# Patient Record
Sex: Male | Born: 1940 | Race: White | Hispanic: No | Marital: Married | Smoking: Former smoker
Health system: Southern US, Community
[De-identification: ages and names within clinical notes are randomized; demographics above are authoritative.]

## PROBLEM LIST (undated history)

## (undated) DIAGNOSIS — E785 Hyperlipidemia, unspecified: Secondary | ICD-10-CM

## (undated) DIAGNOSIS — I10 Essential (primary) hypertension: Secondary | ICD-10-CM

## (undated) DIAGNOSIS — M1990 Unspecified osteoarthritis, unspecified site: Secondary | ICD-10-CM

## (undated) DIAGNOSIS — N189 Chronic kidney disease, unspecified: Secondary | ICD-10-CM

## (undated) DIAGNOSIS — L97529 Non-pressure chronic ulcer of other part of left foot with unspecified severity: Secondary | ICD-10-CM

## (undated) DIAGNOSIS — I509 Heart failure, unspecified: Secondary | ICD-10-CM

## (undated) DIAGNOSIS — G4730 Sleep apnea, unspecified: Secondary | ICD-10-CM

## (undated) DIAGNOSIS — F32A Depression, unspecified: Secondary | ICD-10-CM

## (undated) DIAGNOSIS — E119 Type 2 diabetes mellitus without complications: Secondary | ICD-10-CM

## (undated) DIAGNOSIS — Z89519 Acquired absence of unspecified leg below knee: Secondary | ICD-10-CM

## (undated) DIAGNOSIS — Z9289 Personal history of other medical treatment: Secondary | ICD-10-CM

## (undated) DIAGNOSIS — F329 Major depressive disorder, single episode, unspecified: Secondary | ICD-10-CM

## (undated) HISTORY — DX: Heart failure, unspecified: I50.9

## (undated) HISTORY — PX: CATARACT EXTRACTION W/ INTRAOCULAR LENS  IMPLANT, BILATERAL: SHX1307

## (undated) HISTORY — DX: Essential (primary) hypertension: I10

## (undated) HISTORY — DX: Acquired absence of unspecified leg below knee: Z89.519

## (undated) HISTORY — DX: Unspecified osteoarthritis, unspecified site: M19.90

## (undated) HISTORY — DX: Major depressive disorder, single episode, unspecified: F32.9

## (undated) HISTORY — PX: INGUINAL HERNIA REPAIR: SUR1180

## (undated) HISTORY — DX: Depression, unspecified: F32.A

## (undated) HISTORY — PX: LEG AMPUTATION BELOW KNEE: SHX694

## (undated) HISTORY — PX: ORCHIECTOMY: SHX2116

## (undated) HISTORY — DX: Chronic kidney disease, unspecified: N18.9

## (undated) HISTORY — DX: Hyperlipidemia, unspecified: E78.5

## (undated) HISTORY — DX: Sleep apnea, unspecified: G47.30

## (undated) HISTORY — PX: SHOULDER OPEN ROTATOR CUFF REPAIR: SHX2407

---

## 1997-08-25 ENCOUNTER — Other Ambulatory Visit: Admission: RE | Admit: 1997-08-25 | Discharge: 1997-08-25 | Payer: Self-pay | Admitting: Family Medicine

## 1999-09-28 ENCOUNTER — Ambulatory Visit (HOSPITAL_COMMUNITY): Admission: RE | Admit: 1999-09-28 | Discharge: 1999-09-28 | Payer: Self-pay | Admitting: Nephrology

## 1999-09-28 ENCOUNTER — Encounter: Payer: Self-pay | Admitting: Nephrology

## 1999-11-15 ENCOUNTER — Ambulatory Visit (HOSPITAL_BASED_OUTPATIENT_CLINIC_OR_DEPARTMENT_OTHER): Admission: RE | Admit: 1999-11-15 | Discharge: 1999-11-15 | Payer: Self-pay | Admitting: Orthopedic Surgery

## 2003-10-21 ENCOUNTER — Emergency Department (HOSPITAL_COMMUNITY): Admission: EM | Admit: 2003-10-21 | Discharge: 2003-10-21 | Payer: Self-pay | Admitting: Emergency Medicine

## 2005-06-19 ENCOUNTER — Inpatient Hospital Stay (HOSPITAL_COMMUNITY): Admission: EM | Admit: 2005-06-19 | Discharge: 2005-06-23 | Payer: Self-pay | Admitting: Emergency Medicine

## 2005-09-13 ENCOUNTER — Ambulatory Visit: Payer: Self-pay | Admitting: Family Medicine

## 2005-12-17 ENCOUNTER — Ambulatory Visit: Payer: Self-pay | Admitting: Family Medicine

## 2005-12-17 LAB — CONVERTED CEMR LAB
ALT: 14 units/L (ref 0–40)
AST: 19 units/L (ref 0–37)
CO2: 29 meq/L (ref 19–32)
Calcium: 9.9 mg/dL (ref 8.4–10.5)
Chol/HDL Ratio, serum: 4.9
Cholesterol: 170 mg/dL (ref 0–200)
Glomerular Filtration Rate, Af Am: 56 mL/min/{1.73_m2}
Glucose, Bld: 135 mg/dL — ABNORMAL HIGH (ref 70–99)
Microalb Creat Ratio: 116.9 mg/g — ABNORMAL HIGH (ref 0.0–30.0)
Potassium: 4.8 meq/L (ref 3.5–5.1)
Triglyceride fasting, serum: 147 mg/dL (ref 0–149)

## 2006-02-20 ENCOUNTER — Ambulatory Visit: Payer: Self-pay | Admitting: Family Medicine

## 2006-02-20 LAB — CONVERTED CEMR LAB
AST: 18 units/L (ref 0–37)
Chol/HDL Ratio, serum: 3.8
Cholesterol: 138 mg/dL (ref 0–200)

## 2006-03-31 ENCOUNTER — Ambulatory Visit: Payer: Self-pay | Admitting: Family Medicine

## 2006-04-16 ENCOUNTER — Ambulatory Visit: Payer: Self-pay | Admitting: Internal Medicine

## 2006-04-16 ENCOUNTER — Inpatient Hospital Stay (HOSPITAL_COMMUNITY): Admission: EM | Admit: 2006-04-16 | Discharge: 2006-04-19 | Payer: Self-pay | Admitting: Emergency Medicine

## 2006-04-16 ENCOUNTER — Ambulatory Visit: Payer: Self-pay | Admitting: Family Medicine

## 2006-05-29 ENCOUNTER — Ambulatory Visit: Payer: Self-pay | Admitting: Family Medicine

## 2006-06-11 DIAGNOSIS — E538 Deficiency of other specified B group vitamins: Secondary | ICD-10-CM | POA: Insufficient documentation

## 2006-06-11 DIAGNOSIS — E785 Hyperlipidemia, unspecified: Secondary | ICD-10-CM

## 2006-06-11 DIAGNOSIS — Z8669 Personal history of other diseases of the nervous system and sense organs: Secondary | ICD-10-CM | POA: Insufficient documentation

## 2006-06-11 DIAGNOSIS — IMO0002 Reserved for concepts with insufficient information to code with codable children: Secondary | ICD-10-CM | POA: Insufficient documentation

## 2006-06-11 DIAGNOSIS — E1129 Type 2 diabetes mellitus with other diabetic kidney complication: Secondary | ICD-10-CM

## 2006-06-11 DIAGNOSIS — N259 Disorder resulting from impaired renal tubular function, unspecified: Secondary | ICD-10-CM | POA: Insufficient documentation

## 2006-06-11 DIAGNOSIS — I10 Essential (primary) hypertension: Secondary | ICD-10-CM | POA: Insufficient documentation

## 2006-06-11 DIAGNOSIS — E1165 Type 2 diabetes mellitus with hyperglycemia: Secondary | ICD-10-CM

## 2006-06-11 DIAGNOSIS — M109 Gout, unspecified: Secondary | ICD-10-CM

## 2006-06-11 DIAGNOSIS — F329 Major depressive disorder, single episode, unspecified: Secondary | ICD-10-CM

## 2006-06-30 ENCOUNTER — Ambulatory Visit: Payer: Self-pay | Admitting: Family Medicine

## 2006-08-08 ENCOUNTER — Ambulatory Visit: Payer: Self-pay | Admitting: Family Medicine

## 2006-09-02 ENCOUNTER — Telehealth (INDEPENDENT_AMBULATORY_CARE_PROVIDER_SITE_OTHER): Payer: Self-pay | Admitting: *Deleted

## 2006-09-03 ENCOUNTER — Ambulatory Visit: Payer: Self-pay | Admitting: Family Medicine

## 2006-09-03 DIAGNOSIS — E039 Hypothyroidism, unspecified: Secondary | ICD-10-CM | POA: Insufficient documentation

## 2006-09-03 LAB — CONVERTED CEMR LAB
ALT: 17 units/L (ref 0–53)
BUN: 19 mg/dL (ref 6–23)
CO2: 31 meq/L (ref 19–32)
GFR calc Af Amer: 86 mL/min
Glucose, Bld: 139 mg/dL — ABNORMAL HIGH (ref 70–99)
HDL: 27.1 mg/dL — ABNORMAL LOW (ref >39.0)
Potassium: 4 meq/L (ref 3.5–5.1)
Sodium: 141 meq/L (ref 135–145)
Total CHOL/HDL Ratio: 4.2
Triglycerides: 181 mg/dL — ABNORMAL HIGH (ref 0–149)

## 2006-09-04 ENCOUNTER — Telehealth (INDEPENDENT_AMBULATORY_CARE_PROVIDER_SITE_OTHER): Payer: Self-pay | Admitting: *Deleted

## 2006-10-10 ENCOUNTER — Ambulatory Visit: Payer: Self-pay | Admitting: Family Medicine

## 2006-10-15 LAB — CONVERTED CEMR LAB: TSH: 10.44 microintl units/mL — ABNORMAL HIGH (ref 0.35–5.50)

## 2006-11-07 ENCOUNTER — Ambulatory Visit: Payer: Self-pay | Admitting: Family Medicine

## 2006-11-13 ENCOUNTER — Telehealth (INDEPENDENT_AMBULATORY_CARE_PROVIDER_SITE_OTHER): Payer: Self-pay | Admitting: *Deleted

## 2006-12-04 ENCOUNTER — Ambulatory Visit: Payer: Self-pay | Admitting: Family Medicine

## 2006-12-30 ENCOUNTER — Telehealth (INDEPENDENT_AMBULATORY_CARE_PROVIDER_SITE_OTHER): Payer: Self-pay | Admitting: *Deleted

## 2006-12-30 ENCOUNTER — Ambulatory Visit: Payer: Self-pay | Admitting: Family Medicine

## 2006-12-31 ENCOUNTER — Telehealth (INDEPENDENT_AMBULATORY_CARE_PROVIDER_SITE_OTHER): Payer: Self-pay | Admitting: *Deleted

## 2006-12-31 ENCOUNTER — Encounter (INDEPENDENT_AMBULATORY_CARE_PROVIDER_SITE_OTHER): Payer: Self-pay | Admitting: Family Medicine

## 2006-12-31 LAB — CONVERTED CEMR LAB
BUN: 17 mg/dL (ref 6–23)
CO2: 30 meq/L (ref 19–32)
Calcium: 9.1 mg/dL (ref 8.4–10.5)
Chloride: 105 meq/L (ref 96–112)
Creatinine, Ser: 1.4 mg/dL (ref 0.4–1.5)
Hgb A1c MFr Bld: 6.3 % — ABNORMAL HIGH (ref 4.6–6.0)
TSH: 5.46 microintl units/mL (ref 0.35–5.50)
Uric Acid, Serum: 8.3 mg/dL — ABNORMAL HIGH (ref 2.4–7.0)

## 2007-01-01 ENCOUNTER — Telehealth (INDEPENDENT_AMBULATORY_CARE_PROVIDER_SITE_OTHER): Payer: Self-pay | Admitting: *Deleted

## 2007-01-11 ENCOUNTER — Encounter: Payer: Self-pay | Admitting: Family Medicine

## 2007-04-03 ENCOUNTER — Ambulatory Visit: Payer: Self-pay | Admitting: Family Medicine

## 2007-04-07 ENCOUNTER — Encounter (INDEPENDENT_AMBULATORY_CARE_PROVIDER_SITE_OTHER): Payer: Self-pay | Admitting: *Deleted

## 2007-04-07 LAB — CONVERTED CEMR LAB
ALT: 15 units/L (ref 0–53)
CO2: 31 meq/L (ref 19–32)
Chloride: 102 meq/L (ref 96–112)
Cholesterol: 129 mg/dL (ref 0–200)
Creatinine, Ser: 1.3 mg/dL (ref 0.4–1.5)
HDL: 35.4 mg/dL — ABNORMAL LOW (ref >39.0)
Potassium: 4.7 meq/L (ref 3.5–5.1)
Sodium: 141 meq/L (ref 135–145)
Triglycerides: 150 mg/dL — ABNORMAL HIGH (ref 0–149)

## 2007-06-16 ENCOUNTER — Telehealth (INDEPENDENT_AMBULATORY_CARE_PROVIDER_SITE_OTHER): Payer: Self-pay | Admitting: *Deleted

## 2007-06-18 ENCOUNTER — Ambulatory Visit: Payer: Self-pay | Admitting: Family Medicine

## 2007-06-18 DIAGNOSIS — M1990 Unspecified osteoarthritis, unspecified site: Secondary | ICD-10-CM | POA: Insufficient documentation

## 2007-06-26 ENCOUNTER — Ambulatory Visit: Payer: Self-pay | Admitting: Family Medicine

## 2007-06-29 ENCOUNTER — Telehealth (INDEPENDENT_AMBULATORY_CARE_PROVIDER_SITE_OTHER): Payer: Self-pay | Admitting: *Deleted

## 2007-06-30 LAB — CONVERTED CEMR LAB
ALT: 17 units/L (ref 0–53)
AST: 19 units/L (ref 0–37)
Albumin: 2.8 g/dL — ABNORMAL LOW (ref 3.5–5.2)
BUN: 13 mg/dL (ref 6–23)
CO2: 30 meq/L (ref 19–32)
Calcium: 9.1 mg/dL (ref 8.4–10.5)
Chloride: 105 meq/L (ref 96–112)
Cholesterol: 100 mg/dL (ref 0–200)
Creatinine, Ser: 1.4 mg/dL (ref 0.4–1.5)
HDL: 18.8 mg/dL — ABNORMAL LOW (ref >39.0)
Hgb A1c MFr Bld: 6.9 % — ABNORMAL HIGH (ref 4.6–6.0)
LDL Cholesterol: 52 mg/dL (ref 0–99)
Total Bilirubin: 0.5 mg/dL (ref 0.3–1.2)
Total CHOL/HDL Ratio: 5.3
Triglycerides: 144 mg/dL (ref 0–149)
Uric Acid, Serum: 8.6 mg/dL — ABNORMAL HIGH (ref 4.0–7.8)

## 2007-07-01 ENCOUNTER — Encounter (INDEPENDENT_AMBULATORY_CARE_PROVIDER_SITE_OTHER): Payer: Self-pay | Admitting: *Deleted

## 2007-07-08 ENCOUNTER — Ambulatory Visit: Payer: Self-pay | Admitting: Internal Medicine

## 2007-07-08 ENCOUNTER — Ambulatory Visit: Payer: Self-pay | Admitting: Cardiology

## 2007-07-08 ENCOUNTER — Encounter (INDEPENDENT_AMBULATORY_CARE_PROVIDER_SITE_OTHER): Payer: Self-pay | Admitting: Orthopedic Surgery

## 2007-07-08 ENCOUNTER — Inpatient Hospital Stay (HOSPITAL_COMMUNITY): Admission: RE | Admit: 2007-07-08 | Discharge: 2007-08-03 | Payer: Self-pay | Admitting: Orthopedic Surgery

## 2007-07-09 ENCOUNTER — Ambulatory Visit: Payer: Self-pay | Admitting: Physical Medicine & Rehabilitation

## 2007-07-13 ENCOUNTER — Encounter (INDEPENDENT_AMBULATORY_CARE_PROVIDER_SITE_OTHER): Payer: Self-pay | Admitting: Orthopedic Surgery

## 2007-08-13 ENCOUNTER — Inpatient Hospital Stay (HOSPITAL_COMMUNITY): Admission: EM | Admit: 2007-08-13 | Discharge: 2007-09-01 | Payer: Self-pay | Admitting: Emergency Medicine

## 2007-09-22 ENCOUNTER — Inpatient Hospital Stay (HOSPITAL_COMMUNITY): Admission: EM | Admit: 2007-09-22 | Discharge: 2007-10-02 | Payer: Self-pay | Admitting: Emergency Medicine

## 2007-09-25 ENCOUNTER — Ambulatory Visit: Payer: Self-pay | Admitting: Infectious Diseases

## 2007-09-29 ENCOUNTER — Ambulatory Visit: Payer: Self-pay | Admitting: Dentistry

## 2007-10-01 ENCOUNTER — Ambulatory Visit: Payer: Self-pay | Admitting: Dentistry

## 2007-10-02 ENCOUNTER — Telehealth (INDEPENDENT_AMBULATORY_CARE_PROVIDER_SITE_OTHER): Payer: Self-pay | Admitting: *Deleted

## 2007-10-08 ENCOUNTER — Telehealth (INDEPENDENT_AMBULATORY_CARE_PROVIDER_SITE_OTHER): Payer: Self-pay | Admitting: *Deleted

## 2007-10-12 ENCOUNTER — Encounter: Admission: RE | Admit: 2007-10-12 | Discharge: 2007-10-12 | Payer: Self-pay | Admitting: Dentistry

## 2007-11-03 ENCOUNTER — Emergency Department (HOSPITAL_COMMUNITY): Admission: EM | Admit: 2007-11-03 | Discharge: 2007-11-04 | Payer: Self-pay | Admitting: Emergency Medicine

## 2007-11-23 ENCOUNTER — Telehealth: Payer: Self-pay | Admitting: Family Medicine

## 2007-12-02 ENCOUNTER — Ambulatory Visit: Payer: Self-pay | Admitting: Dentistry

## 2007-12-10 ENCOUNTER — Telehealth (INDEPENDENT_AMBULATORY_CARE_PROVIDER_SITE_OTHER): Payer: Self-pay | Admitting: *Deleted

## 2007-12-11 ENCOUNTER — Telehealth: Payer: Self-pay | Admitting: Family Medicine

## 2007-12-15 ENCOUNTER — Ambulatory Visit: Payer: Self-pay | Admitting: Internal Medicine

## 2007-12-15 ENCOUNTER — Inpatient Hospital Stay (HOSPITAL_COMMUNITY): Admission: EM | Admit: 2007-12-15 | Discharge: 2007-12-23 | Payer: Self-pay | Admitting: Emergency Medicine

## 2007-12-15 ENCOUNTER — Ambulatory Visit: Payer: Self-pay | Admitting: Critical Care Medicine

## 2007-12-19 ENCOUNTER — Ambulatory Visit: Payer: Self-pay | Admitting: Internal Medicine

## 2007-12-23 ENCOUNTER — Encounter: Payer: Self-pay | Admitting: Family Medicine

## 2007-12-25 ENCOUNTER — Telehealth (INDEPENDENT_AMBULATORY_CARE_PROVIDER_SITE_OTHER): Payer: Self-pay | Admitting: *Deleted

## 2007-12-28 ENCOUNTER — Telehealth (INDEPENDENT_AMBULATORY_CARE_PROVIDER_SITE_OTHER): Payer: Self-pay | Admitting: *Deleted

## 2007-12-29 ENCOUNTER — Encounter: Payer: Self-pay | Admitting: Family Medicine

## 2007-12-30 ENCOUNTER — Encounter: Payer: Self-pay | Admitting: Family Medicine

## 2007-12-31 ENCOUNTER — Encounter: Payer: Self-pay | Admitting: Family Medicine

## 2008-01-01 ENCOUNTER — Telehealth (INDEPENDENT_AMBULATORY_CARE_PROVIDER_SITE_OTHER): Payer: Self-pay | Admitting: *Deleted

## 2008-01-06 ENCOUNTER — Encounter: Payer: Self-pay | Admitting: Family Medicine

## 2008-01-07 ENCOUNTER — Telehealth: Payer: Self-pay | Admitting: Family Medicine

## 2008-01-18 ENCOUNTER — Telehealth (INDEPENDENT_AMBULATORY_CARE_PROVIDER_SITE_OTHER): Payer: Self-pay | Admitting: *Deleted

## 2008-01-20 ENCOUNTER — Telehealth: Payer: Self-pay | Admitting: Family Medicine

## 2008-02-01 ENCOUNTER — Encounter: Payer: Self-pay | Admitting: Family Medicine

## 2008-02-05 ENCOUNTER — Encounter: Payer: Self-pay | Admitting: Family Medicine

## 2008-02-10 ENCOUNTER — Ambulatory Visit: Payer: Self-pay | Admitting: Family Medicine

## 2008-02-10 LAB — CONVERTED CEMR LAB
ALT: 19 units/L (ref 0–53)
AST: 23 units/L (ref 0–37)
BUN: 13 mg/dL (ref 6–23)
Basophils Relative: 0.6 % (ref 0.0–3.0)
CO2: 30 meq/L (ref 19–32)
Calcium: 9.8 mg/dL (ref 8.4–10.5)
Chloride: 103 meq/L (ref 96–112)
Creatinine, Ser: 1.1 mg/dL (ref 0.4–1.5)
Eosinophils Absolute: 0.2 10*3/uL (ref 0.0–0.7)
Eosinophils Relative: 3.8 % (ref 0.0–5.0)
Folate: >20 ng/mL
Glucose, Bld: 148 mg/dL — ABNORMAL HIGH (ref 70–99)
HCT: 34.6 % — ABNORMAL LOW (ref 39.0–52.0)
HDL: 24.9 mg/dL — ABNORMAL LOW (ref >39.0)
Hemoglobin: 11.8 g/dL — ABNORMAL LOW (ref 13.0–17.0)
Hgb A1c MFr Bld: 5 % (ref 4.6–6.0)
MCV: 93.1 fL (ref 78.0–100.0)
Monocytes Absolute: 0.3 10*3/uL (ref 0.1–1.0)
Monocytes Relative: 5.8 % (ref 3.0–12.0)
Neutro Abs: 4 10*3/uL (ref 1.4–7.7)
RBC: 3.71 M/uL — ABNORMAL LOW (ref 4.22–5.81)
Total Bilirubin: 0.7 mg/dL (ref 0.3–1.2)
Total CHOL/HDL Ratio: 4.7
Total Protein: 7.7 g/dL (ref 6.0–8.3)
WBC: 5.9 10*3/uL (ref 4.5–10.5)

## 2008-02-11 ENCOUNTER — Telehealth (INDEPENDENT_AMBULATORY_CARE_PROVIDER_SITE_OTHER): Payer: Self-pay | Admitting: *Deleted

## 2008-02-12 ENCOUNTER — Telehealth: Payer: Self-pay | Admitting: Family Medicine

## 2008-02-16 ENCOUNTER — Encounter (INDEPENDENT_AMBULATORY_CARE_PROVIDER_SITE_OTHER): Payer: Self-pay | Admitting: *Deleted

## 2008-02-22 ENCOUNTER — Encounter: Payer: Self-pay | Admitting: Family Medicine

## 2008-02-25 ENCOUNTER — Telehealth (INDEPENDENT_AMBULATORY_CARE_PROVIDER_SITE_OTHER): Payer: Self-pay | Admitting: *Deleted

## 2008-02-29 ENCOUNTER — Encounter: Payer: Self-pay | Admitting: Family Medicine

## 2008-03-02 ENCOUNTER — Encounter: Payer: Self-pay | Admitting: Family Medicine

## 2008-03-15 ENCOUNTER — Telehealth: Payer: Self-pay | Admitting: Family Medicine

## 2008-03-21 ENCOUNTER — Telehealth (INDEPENDENT_AMBULATORY_CARE_PROVIDER_SITE_OTHER): Payer: Self-pay | Admitting: *Deleted

## 2008-03-28 ENCOUNTER — Telehealth: Payer: Self-pay | Admitting: Family Medicine

## 2008-04-05 ENCOUNTER — Telehealth (INDEPENDENT_AMBULATORY_CARE_PROVIDER_SITE_OTHER): Payer: Self-pay | Admitting: *Deleted

## 2008-05-03 ENCOUNTER — Encounter: Payer: Self-pay | Admitting: Family Medicine

## 2008-06-02 ENCOUNTER — Telehealth (INDEPENDENT_AMBULATORY_CARE_PROVIDER_SITE_OTHER): Payer: Self-pay | Admitting: *Deleted

## 2008-06-02 ENCOUNTER — Telehealth: Payer: Self-pay | Admitting: Family Medicine

## 2008-06-10 ENCOUNTER — Telehealth (INDEPENDENT_AMBULATORY_CARE_PROVIDER_SITE_OTHER): Payer: Self-pay | Admitting: *Deleted

## 2008-06-13 ENCOUNTER — Encounter: Payer: Self-pay | Admitting: Family Medicine

## 2008-06-25 ENCOUNTER — Encounter: Payer: Self-pay | Admitting: Family Medicine

## 2008-07-18 ENCOUNTER — Telehealth (INDEPENDENT_AMBULATORY_CARE_PROVIDER_SITE_OTHER): Payer: Self-pay | Admitting: *Deleted

## 2008-07-20 ENCOUNTER — Encounter: Admission: RE | Admit: 2008-07-20 | Discharge: 2008-10-18 | Payer: Self-pay | Admitting: Orthopedic Surgery

## 2008-08-18 ENCOUNTER — Encounter: Payer: Self-pay | Admitting: Family Medicine

## 2008-08-19 ENCOUNTER — Ambulatory Visit: Payer: Self-pay | Admitting: Family Medicine

## 2008-08-19 LAB — CONVERTED CEMR LAB
Bilirubin Urine: NEGATIVE
Ketones, urine, test strip: NEGATIVE
Nitrite: NEGATIVE
Protein, U semiquant: NEGATIVE
Urobilinogen, UA: NEGATIVE

## 2008-08-20 ENCOUNTER — Encounter: Payer: Self-pay | Admitting: Family Medicine

## 2008-08-21 LAB — CONVERTED CEMR LAB
ALT: 14 units/L (ref 0–53)
AST: 23 units/L (ref 0–37)
Basophils Absolute: 0 10*3/uL (ref 0.0–0.1)
CO2: 28 meq/L (ref 19–32)
Cholesterol: 156 mg/dL (ref 0–200)
Creatinine,U: 50.8 mg/dL
Eosinophils Absolute: 0.2 10*3/uL (ref 0.0–0.7)
Folate: 11 ng/mL
Glucose, Bld: 144 mg/dL — ABNORMAL HIGH (ref 70–99)
HCT: 34.1 % — ABNORMAL LOW (ref 39.0–52.0)
Hemoglobin: 11.5 g/dL — ABNORMAL LOW (ref 13.0–17.0)
Lymphs Abs: 1.2 10*3/uL (ref 0.7–4.0)
MCHC: 33.9 g/dL (ref 30.0–36.0)
MCV: 93.3 fL (ref 78.0–100.0)
Microalb, Ur: 10.1 mg/dL — ABNORMAL HIGH (ref 0.0–1.9)
Monocytes Absolute: 0.3 10*3/uL (ref 0.1–1.0)
Neutro Abs: 3.5 10*3/uL (ref 1.4–7.7)
Potassium: 4.4 meq/L (ref 3.5–5.1)
RDW: 15 % — ABNORMAL HIGH (ref 11.5–14.6)
Sodium: 140 meq/L (ref 135–145)
Total Protein: 7.6 g/dL (ref 6.0–8.3)
Uric Acid, Serum: 6.7 mg/dL (ref 4.0–7.8)
Vitamin B-12: >1500 pg/mL — ABNORMAL HIGH (ref 211–911)

## 2008-08-22 ENCOUNTER — Telehealth (INDEPENDENT_AMBULATORY_CARE_PROVIDER_SITE_OTHER): Payer: Self-pay | Admitting: *Deleted

## 2008-08-24 ENCOUNTER — Telehealth (INDEPENDENT_AMBULATORY_CARE_PROVIDER_SITE_OTHER): Payer: Self-pay | Admitting: *Deleted

## 2008-08-25 ENCOUNTER — Telehealth: Payer: Self-pay | Admitting: Family Medicine

## 2008-09-14 ENCOUNTER — Telehealth (INDEPENDENT_AMBULATORY_CARE_PROVIDER_SITE_OTHER): Payer: Self-pay | Admitting: *Deleted

## 2008-09-26 ENCOUNTER — Telehealth (INDEPENDENT_AMBULATORY_CARE_PROVIDER_SITE_OTHER): Payer: Self-pay | Admitting: *Deleted

## 2008-10-20 ENCOUNTER — Encounter: Admission: RE | Admit: 2008-10-20 | Discharge: 2008-12-13 | Payer: Self-pay | Admitting: Orthopedic Surgery

## 2008-10-24 ENCOUNTER — Telehealth: Payer: Self-pay | Admitting: Family Medicine

## 2008-11-08 ENCOUNTER — Encounter (INDEPENDENT_AMBULATORY_CARE_PROVIDER_SITE_OTHER): Payer: Self-pay | Admitting: *Deleted

## 2008-11-14 ENCOUNTER — Encounter: Payer: Self-pay | Admitting: Family Medicine

## 2008-11-16 ENCOUNTER — Ambulatory Visit: Payer: Self-pay | Admitting: Family Medicine

## 2008-11-17 ENCOUNTER — Encounter: Payer: Self-pay | Admitting: Family Medicine

## 2008-11-22 ENCOUNTER — Encounter: Payer: Self-pay | Admitting: Family Medicine

## 2008-12-01 ENCOUNTER — Inpatient Hospital Stay (HOSPITAL_COMMUNITY): Admission: EM | Admit: 2008-12-01 | Discharge: 2008-12-12 | Payer: Self-pay | Admitting: Emergency Medicine

## 2008-12-01 ENCOUNTER — Encounter: Payer: Self-pay | Admitting: Family Medicine

## 2008-12-10 ENCOUNTER — Encounter (INDEPENDENT_AMBULATORY_CARE_PROVIDER_SITE_OTHER): Payer: Self-pay | Admitting: Cardiology

## 2008-12-11 ENCOUNTER — Encounter: Payer: Self-pay | Admitting: Family Medicine

## 2008-12-13 ENCOUNTER — Telehealth: Payer: Self-pay | Admitting: Family Medicine

## 2008-12-21 ENCOUNTER — Telehealth: Payer: Self-pay | Admitting: Family Medicine

## 2008-12-22 ENCOUNTER — Encounter: Payer: Self-pay | Admitting: Family Medicine

## 2008-12-23 ENCOUNTER — Encounter: Payer: Self-pay | Admitting: Family Medicine

## 2008-12-27 ENCOUNTER — Encounter: Payer: Self-pay | Admitting: Family Medicine

## 2008-12-29 ENCOUNTER — Telehealth: Payer: Self-pay | Admitting: Family Medicine

## 2009-01-03 ENCOUNTER — Encounter: Payer: Self-pay | Admitting: Family Medicine

## 2009-01-05 ENCOUNTER — Ambulatory Visit: Payer: Self-pay | Admitting: Family Medicine

## 2009-01-05 DIAGNOSIS — S88119A Complete traumatic amputation at level between knee and ankle, unspecified lower leg, initial encounter: Secondary | ICD-10-CM | POA: Insufficient documentation

## 2009-01-06 ENCOUNTER — Encounter: Payer: Self-pay | Admitting: Family Medicine

## 2009-01-12 ENCOUNTER — Telehealth (INDEPENDENT_AMBULATORY_CARE_PROVIDER_SITE_OTHER): Payer: Self-pay | Admitting: *Deleted

## 2009-01-13 IMAGING — CR DG CHEST 2V
3 series · 3 of 3 positions shown · non-contrast
Comparison: Portable chest x-ray 07/20/2007 and 07/14/2007.

CLINICAL DATA: Fever.  Decubitus ulcer.

CHEST - 2 VIEW 09/22/2007:

[w chest lat]
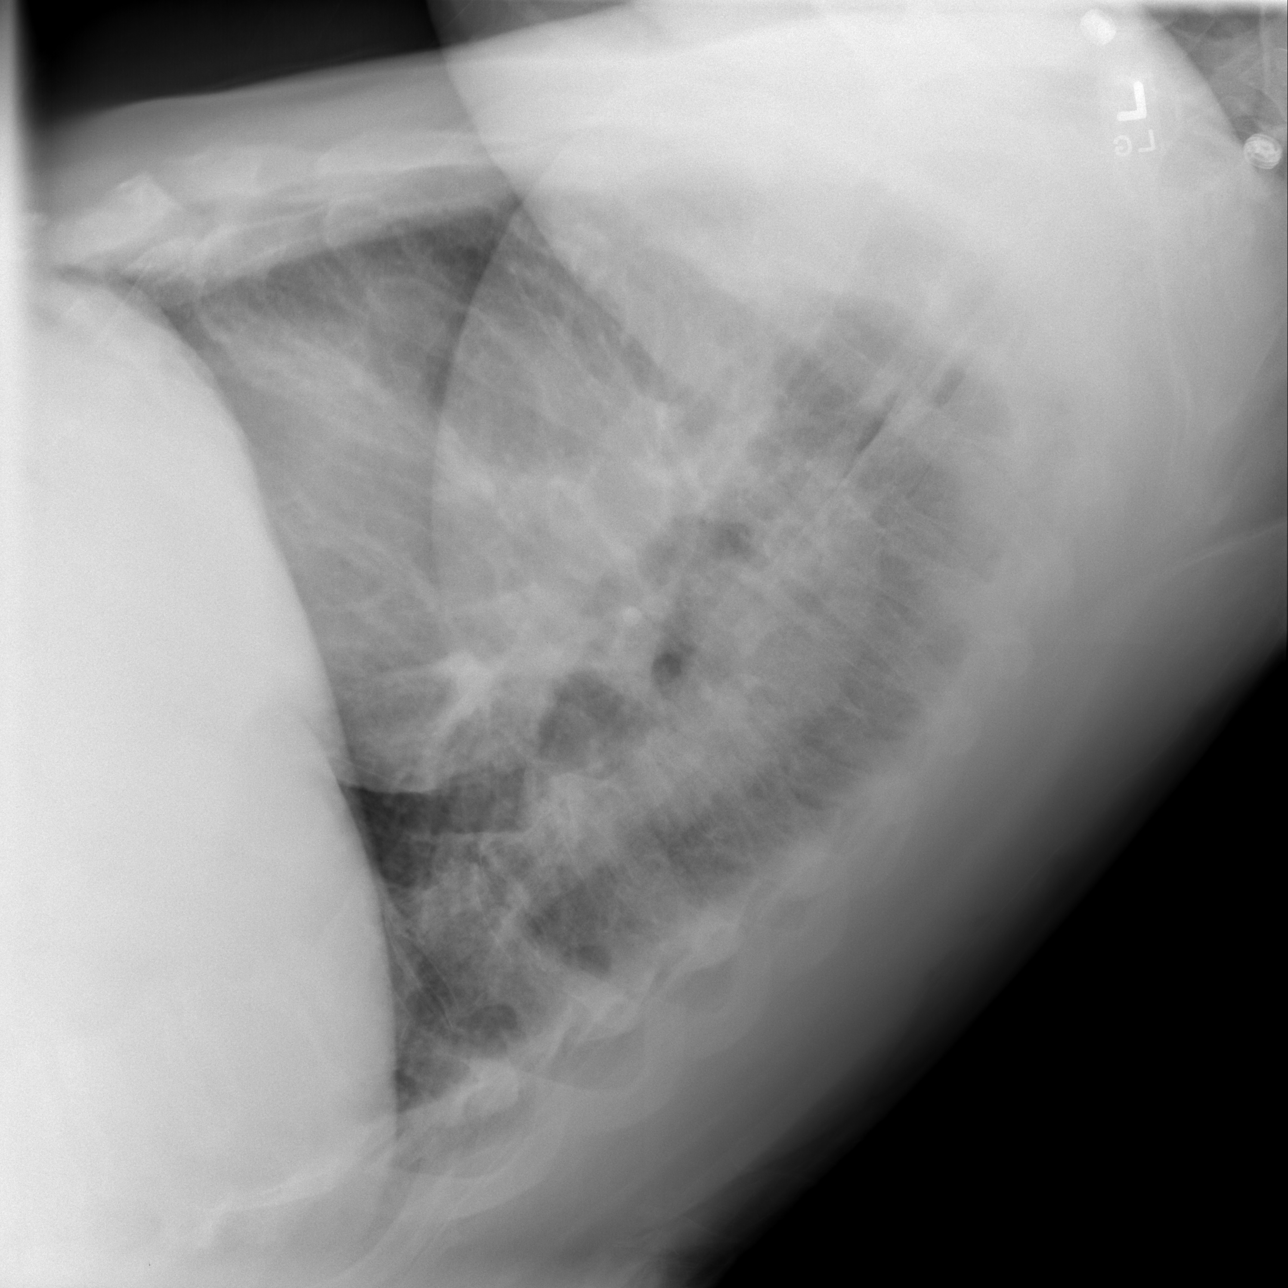

[view not recorded (1 of 2)]
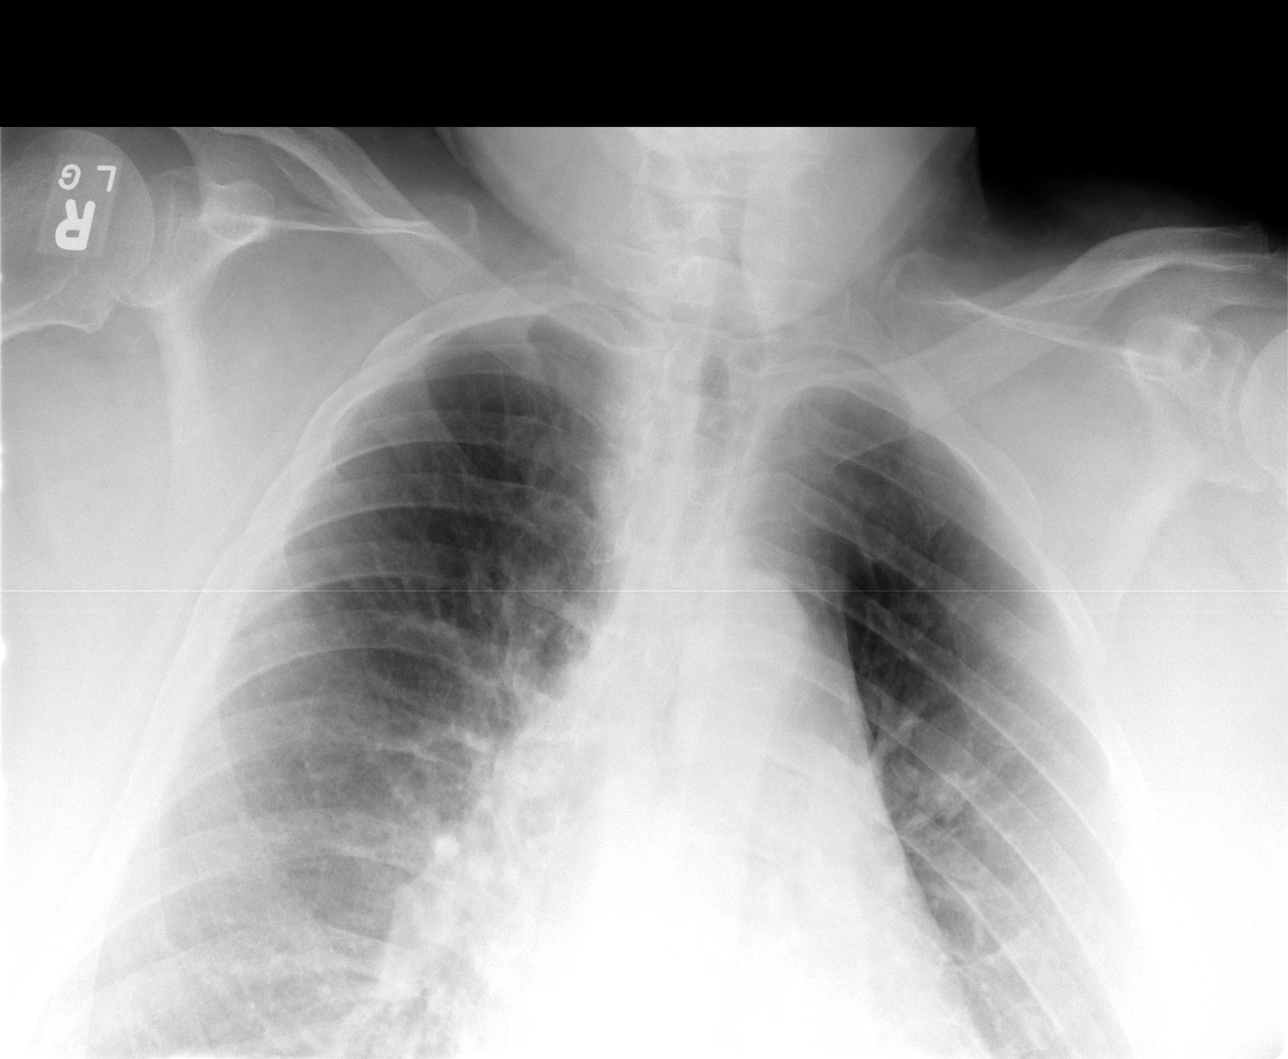

[view not recorded (2 of 2)]
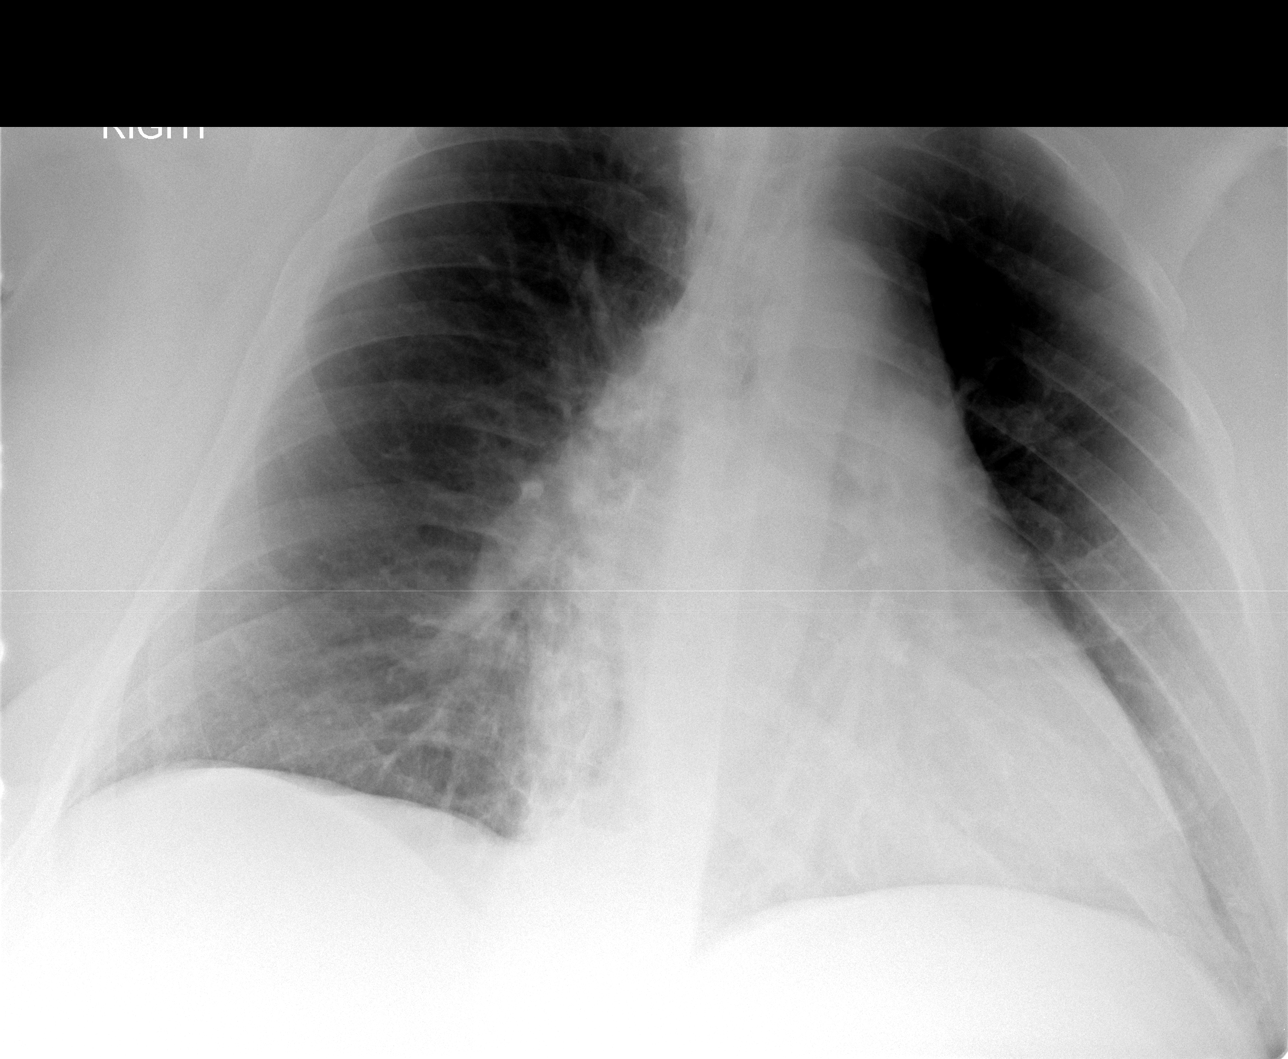

[3 of 3 positions shown; findings below may reference images not displayed]

FINDINGS: Heart enlarged but stable.  Pulmonary venous hypertension
without overt edema.  Emphysematous changes suspected in the upper
lobes.  Lungs clear.  No pleural effusions.  Degenerative changes
again noted throughout the thoracic spine.
IMPRESSION: Stable cardiomegaly and COPD/emphysema.  No acute cardiopulmonary
disease.

## 2009-01-18 ENCOUNTER — Telehealth: Payer: Self-pay | Admitting: Family Medicine

## 2009-01-18 ENCOUNTER — Encounter: Payer: Self-pay | Admitting: Family Medicine

## 2009-01-19 ENCOUNTER — Encounter: Payer: Self-pay | Admitting: Family Medicine

## 2009-01-19 ENCOUNTER — Telehealth: Payer: Self-pay | Admitting: Family Medicine

## 2009-01-25 ENCOUNTER — Encounter: Payer: Self-pay | Admitting: Family Medicine

## 2009-01-30 ENCOUNTER — Telehealth (INDEPENDENT_AMBULATORY_CARE_PROVIDER_SITE_OTHER): Payer: Self-pay | Admitting: *Deleted

## 2009-01-31 ENCOUNTER — Encounter: Payer: Self-pay | Admitting: Family Medicine

## 2009-02-03 ENCOUNTER — Ambulatory Visit: Payer: Self-pay | Admitting: Family Medicine

## 2009-02-06 DIAGNOSIS — G4730 Sleep apnea, unspecified: Secondary | ICD-10-CM | POA: Insufficient documentation

## 2009-02-07 ENCOUNTER — Encounter (INDEPENDENT_AMBULATORY_CARE_PROVIDER_SITE_OTHER): Payer: Self-pay | Admitting: *Deleted

## 2009-02-09 ENCOUNTER — Encounter: Payer: Self-pay | Admitting: Family Medicine

## 2009-02-13 ENCOUNTER — Telehealth (INDEPENDENT_AMBULATORY_CARE_PROVIDER_SITE_OTHER): Payer: Self-pay | Admitting: *Deleted

## 2009-02-14 ENCOUNTER — Encounter: Payer: Self-pay | Admitting: Family Medicine

## 2009-02-17 ENCOUNTER — Encounter: Payer: Self-pay | Admitting: Family Medicine

## 2009-03-06 ENCOUNTER — Telehealth: Payer: Self-pay | Admitting: Family Medicine

## 2009-03-06 ENCOUNTER — Ambulatory Visit: Payer: Self-pay | Admitting: Family Medicine

## 2009-03-09 ENCOUNTER — Telehealth: Payer: Self-pay | Admitting: Family Medicine

## 2009-03-13 ENCOUNTER — Encounter: Payer: Self-pay | Admitting: Family Medicine

## 2009-03-15 ENCOUNTER — Telehealth: Payer: Self-pay | Admitting: Family Medicine

## 2009-03-16 ENCOUNTER — Encounter: Payer: Self-pay | Admitting: Family Medicine

## 2009-03-29 ENCOUNTER — Telehealth (INDEPENDENT_AMBULATORY_CARE_PROVIDER_SITE_OTHER): Payer: Self-pay | Admitting: *Deleted

## 2009-04-03 ENCOUNTER — Encounter: Payer: Self-pay | Admitting: Family Medicine

## 2009-04-14 ENCOUNTER — Telehealth: Payer: Self-pay | Admitting: Family Medicine

## 2009-04-17 ENCOUNTER — Encounter: Payer: Self-pay | Admitting: Family Medicine

## 2009-04-18 ENCOUNTER — Encounter: Payer: Self-pay | Admitting: Family Medicine

## 2009-04-19 ENCOUNTER — Encounter: Payer: Self-pay | Admitting: Family Medicine

## 2009-04-25 ENCOUNTER — Encounter: Payer: Self-pay | Admitting: Family Medicine

## 2009-04-27 ENCOUNTER — Encounter: Payer: Self-pay | Admitting: Family Medicine

## 2009-04-28 ENCOUNTER — Encounter: Payer: Self-pay | Admitting: Family Medicine

## 2009-05-01 ENCOUNTER — Ambulatory Visit: Payer: Self-pay | Admitting: Family Medicine

## 2009-05-01 ENCOUNTER — Telehealth: Payer: Self-pay | Admitting: Family Medicine

## 2009-05-03 ENCOUNTER — Telehealth (INDEPENDENT_AMBULATORY_CARE_PROVIDER_SITE_OTHER): Payer: Self-pay | Admitting: *Deleted

## 2009-05-04 ENCOUNTER — Encounter: Payer: Self-pay | Admitting: Family Medicine

## 2009-05-04 ENCOUNTER — Telehealth (INDEPENDENT_AMBULATORY_CARE_PROVIDER_SITE_OTHER): Payer: Self-pay | Admitting: *Deleted

## 2009-05-15 ENCOUNTER — Telehealth: Payer: Self-pay | Admitting: Family Medicine

## 2009-05-17 ENCOUNTER — Telehealth: Payer: Self-pay | Admitting: Family Medicine

## 2009-05-22 ENCOUNTER — Encounter: Payer: Self-pay | Admitting: Family Medicine

## 2009-05-29 ENCOUNTER — Telehealth: Payer: Self-pay | Admitting: Family Medicine

## 2009-05-31 ENCOUNTER — Encounter: Payer: Self-pay | Admitting: Family Medicine

## 2009-05-31 ENCOUNTER — Telehealth (INDEPENDENT_AMBULATORY_CARE_PROVIDER_SITE_OTHER): Payer: Self-pay | Admitting: *Deleted

## 2009-06-21 ENCOUNTER — Telehealth: Payer: Self-pay | Admitting: Family Medicine

## 2009-06-26 ENCOUNTER — Telehealth (INDEPENDENT_AMBULATORY_CARE_PROVIDER_SITE_OTHER): Payer: Self-pay | Admitting: *Deleted

## 2009-06-30 ENCOUNTER — Telehealth: Payer: Self-pay | Admitting: Family Medicine

## 2009-06-30 ENCOUNTER — Encounter: Payer: Self-pay | Admitting: Family Medicine

## 2009-07-06 ENCOUNTER — Encounter: Payer: Self-pay | Admitting: Family Medicine

## 2009-07-11 ENCOUNTER — Telehealth (INDEPENDENT_AMBULATORY_CARE_PROVIDER_SITE_OTHER): Payer: Self-pay | Admitting: *Deleted

## 2009-07-18 ENCOUNTER — Telehealth (INDEPENDENT_AMBULATORY_CARE_PROVIDER_SITE_OTHER): Payer: Self-pay | Admitting: *Deleted

## 2009-07-25 ENCOUNTER — Telehealth: Payer: Self-pay | Admitting: Family Medicine

## 2009-08-01 ENCOUNTER — Encounter: Payer: Self-pay | Admitting: Family Medicine

## 2009-08-02 ENCOUNTER — Encounter: Payer: Self-pay | Admitting: Family Medicine

## 2009-08-08 ENCOUNTER — Ambulatory Visit: Payer: Self-pay | Admitting: Family Medicine

## 2009-08-08 DIAGNOSIS — H10029 Other mucopurulent conjunctivitis, unspecified eye: Secondary | ICD-10-CM

## 2009-08-08 LAB — CONVERTED CEMR LAB
ALT: 9 units/L (ref 0–53)
AST: 12 units/L (ref 0–37)
Albumin: 3.7 g/dL (ref 3.5–5.2)
BUN: 34 mg/dL — ABNORMAL HIGH (ref 6–23)
Basophils Relative: 0.4 % (ref 0.0–3.0)
Chloride: 107 meq/L (ref 96–112)
Cholesterol: 125 mg/dL (ref 0–200)
Creatinine,U: 52.2 mg/dL
Eosinophils Relative: 2.2 % (ref 0.0–5.0)
Folate: 6 ng/mL
GFR calc non Af Amer: 42.52 mL/min (ref >60)
Glucose, Bld: 59 mg/dL — ABNORMAL LOW (ref 70–99)
HCT: 32.5 % — ABNORMAL LOW (ref 39.0–52.0)
Hemoglobin: 10.9 g/dL — ABNORMAL LOW (ref 13.0–17.0)
Iron: 62 ug/dL (ref 42–165)
LDL Cholesterol: 70 mg/dL (ref 0–99)
Lymphs Abs: 1.1 10*3/uL (ref 0.7–4.0)
MCV: 93.5 fL (ref 78.0–100.0)
Monocytes Absolute: 0.4 10*3/uL (ref 0.1–1.0)
Monocytes Relative: 7 % (ref 3.0–12.0)
Neutro Abs: 4.2 10*3/uL (ref 1.4–7.7)
Potassium: 4.9 meq/L (ref 3.5–5.1)
Saturation Ratios: 16.9 % — ABNORMAL LOW (ref 20.0–50.0)
Sodium: 144 meq/L (ref 135–145)
TSH: 2.66 microintl units/mL (ref 0.35–5.50)
Total Protein: 7 g/dL (ref 6.0–8.3)
Uric Acid, Serum: 5.8 mg/dL (ref 4.0–7.8)
VLDL: 13.6 mg/dL (ref 0.0–40.0)
Vitamin B-12: 318 pg/mL (ref 211–911)
WBC: 5.9 10*3/uL (ref 4.5–10.5)

## 2009-08-10 ENCOUNTER — Telehealth: Payer: Self-pay | Admitting: Family Medicine

## 2009-08-11 ENCOUNTER — Telehealth: Payer: Self-pay | Admitting: Family Medicine

## 2009-08-14 ENCOUNTER — Encounter: Payer: Self-pay | Admitting: Family Medicine

## 2009-08-14 ENCOUNTER — Telehealth: Payer: Self-pay | Admitting: Family Medicine

## 2009-08-14 ENCOUNTER — Ambulatory Visit (HOSPITAL_COMMUNITY): Admission: RE | Admit: 2009-08-14 | Discharge: 2009-08-14 | Payer: Self-pay | Admitting: Family Medicine

## 2009-08-22 ENCOUNTER — Telehealth (INDEPENDENT_AMBULATORY_CARE_PROVIDER_SITE_OTHER): Payer: Self-pay | Admitting: *Deleted

## 2009-08-24 ENCOUNTER — Telehealth: Payer: Self-pay | Admitting: Family Medicine

## 2009-08-31 ENCOUNTER — Telehealth: Payer: Self-pay | Admitting: Family Medicine

## 2009-09-06 ENCOUNTER — Encounter: Payer: Self-pay | Admitting: Family Medicine

## 2009-09-07 ENCOUNTER — Encounter: Payer: Self-pay | Admitting: Family Medicine

## 2009-09-15 ENCOUNTER — Telehealth: Payer: Self-pay | Admitting: Family Medicine

## 2009-09-19 ENCOUNTER — Encounter: Payer: Self-pay | Admitting: Family Medicine

## 2009-09-21 ENCOUNTER — Telehealth: Payer: Self-pay | Admitting: Family Medicine

## 2009-09-25 ENCOUNTER — Encounter: Payer: Self-pay | Admitting: Family Medicine

## 2009-09-28 ENCOUNTER — Encounter: Payer: Self-pay | Admitting: Family Medicine

## 2009-10-04 ENCOUNTER — Encounter: Payer: Self-pay | Admitting: Family Medicine

## 2009-10-04 ENCOUNTER — Telehealth (INDEPENDENT_AMBULATORY_CARE_PROVIDER_SITE_OTHER): Payer: Self-pay | Admitting: *Deleted

## 2009-10-05 ENCOUNTER — Telehealth (INDEPENDENT_AMBULATORY_CARE_PROVIDER_SITE_OTHER): Payer: Self-pay | Admitting: *Deleted

## 2009-10-05 DIAGNOSIS — D510 Vitamin B12 deficiency anemia due to intrinsic factor deficiency: Secondary | ICD-10-CM

## 2009-10-06 ENCOUNTER — Encounter: Payer: Self-pay | Admitting: Family Medicine

## 2009-10-16 ENCOUNTER — Ambulatory Visit: Payer: Self-pay | Admitting: Family Medicine

## 2009-10-18 ENCOUNTER — Telehealth: Payer: Self-pay | Admitting: Family Medicine

## 2009-10-18 ENCOUNTER — Telehealth (INDEPENDENT_AMBULATORY_CARE_PROVIDER_SITE_OTHER): Payer: Self-pay | Admitting: *Deleted

## 2009-10-18 LAB — CONVERTED CEMR LAB
Basophils Relative: 0.3 % (ref 0.0–3.0)
Eosinophils Absolute: 0.1 10*3/uL (ref 0.0–0.7)
HCT: 33.1 % — ABNORMAL LOW (ref 39.0–52.0)
Hemoglobin: 11 g/dL — ABNORMAL LOW (ref 13.0–17.0)
Lymphocytes Relative: 16.7 % (ref 12.0–46.0)
Lymphs Abs: 1.1 10*3/uL (ref 0.7–4.0)
MCHC: 33.2 g/dL (ref 30.0–36.0)
MCV: 94.3 fL (ref 78.0–100.0)
Neutro Abs: 5 10*3/uL (ref 1.4–7.7)
RBC: 3.51 M/uL — ABNORMAL LOW (ref 4.22–5.81)
RDW: 16.3 % — ABNORMAL HIGH (ref 11.5–14.6)

## 2009-10-30 ENCOUNTER — Encounter: Payer: Self-pay | Admitting: Family Medicine

## 2009-10-31 ENCOUNTER — Telehealth: Payer: Self-pay | Admitting: Family Medicine

## 2009-11-02 ENCOUNTER — Telehealth (INDEPENDENT_AMBULATORY_CARE_PROVIDER_SITE_OTHER): Payer: Self-pay | Admitting: *Deleted

## 2009-11-02 DIAGNOSIS — L89309 Pressure ulcer of unspecified buttock, unspecified stage: Secondary | ICD-10-CM | POA: Insufficient documentation

## 2009-11-09 ENCOUNTER — Encounter: Payer: Self-pay | Admitting: Family Medicine

## 2009-11-16 ENCOUNTER — Encounter: Payer: Self-pay | Admitting: Family Medicine

## 2009-11-17 ENCOUNTER — Ambulatory Visit (HOSPITAL_COMMUNITY): Admission: RE | Admit: 2009-11-17 | Discharge: 2009-11-17 | Payer: Self-pay | Admitting: Urology

## 2009-11-23 ENCOUNTER — Encounter: Payer: Self-pay | Admitting: Family Medicine

## 2009-11-24 ENCOUNTER — Encounter: Payer: Self-pay | Admitting: Family Medicine

## 2009-11-30 ENCOUNTER — Telehealth (INDEPENDENT_AMBULATORY_CARE_PROVIDER_SITE_OTHER): Payer: Self-pay | Admitting: *Deleted

## 2009-12-01 ENCOUNTER — Encounter: Payer: Self-pay | Admitting: Family Medicine

## 2009-12-12 ENCOUNTER — Telehealth: Payer: Self-pay | Admitting: Family Medicine

## 2009-12-19 ENCOUNTER — Encounter: Payer: Self-pay | Admitting: Family Medicine

## 2009-12-20 ENCOUNTER — Inpatient Hospital Stay (HOSPITAL_COMMUNITY): Admission: EM | Admit: 2009-12-20 | Discharge: 2010-01-01 | Payer: Self-pay | Admitting: Emergency Medicine

## 2009-12-20 ENCOUNTER — Ambulatory Visit: Payer: Self-pay | Admitting: Emergency Medicine

## 2009-12-20 ENCOUNTER — Encounter (INDEPENDENT_AMBULATORY_CARE_PROVIDER_SITE_OTHER): Payer: Self-pay | Admitting: Emergency Medicine

## 2009-12-20 ENCOUNTER — Ambulatory Visit: Payer: Self-pay | Admitting: Vascular Surgery

## 2009-12-20 ENCOUNTER — Telehealth: Payer: Self-pay | Admitting: Family Medicine

## 2009-12-25 ENCOUNTER — Encounter (INDEPENDENT_AMBULATORY_CARE_PROVIDER_SITE_OTHER): Payer: Self-pay | Admitting: Pulmonary Disease

## 2009-12-25 ENCOUNTER — Encounter (INDEPENDENT_AMBULATORY_CARE_PROVIDER_SITE_OTHER): Payer: Self-pay | Admitting: Internal Medicine

## 2009-12-29 ENCOUNTER — Ambulatory Visit: Payer: Self-pay | Admitting: Infectious Diseases

## 2010-01-02 ENCOUNTER — Encounter: Payer: Self-pay | Admitting: Family Medicine

## 2010-01-03 ENCOUNTER — Telehealth (INDEPENDENT_AMBULATORY_CARE_PROVIDER_SITE_OTHER): Payer: Self-pay | Admitting: *Deleted

## 2010-01-03 ENCOUNTER — Encounter: Payer: Self-pay | Admitting: Family Medicine

## 2010-01-04 ENCOUNTER — Encounter: Payer: Self-pay | Admitting: Family Medicine

## 2010-01-05 ENCOUNTER — Telehealth (INDEPENDENT_AMBULATORY_CARE_PROVIDER_SITE_OTHER): Payer: Self-pay | Admitting: *Deleted

## 2010-01-09 ENCOUNTER — Encounter: Payer: Self-pay | Admitting: Infectious Diseases

## 2010-01-09 ENCOUNTER — Encounter: Payer: Self-pay | Admitting: Family Medicine

## 2010-01-09 ENCOUNTER — Emergency Department (HOSPITAL_COMMUNITY): Admission: EM | Admit: 2010-01-09 | Discharge: 2010-01-09 | Payer: Self-pay | Admitting: Emergency Medicine

## 2010-01-10 ENCOUNTER — Encounter: Payer: Self-pay | Admitting: Family Medicine

## 2010-01-10 ENCOUNTER — Telehealth: Payer: Self-pay | Admitting: Family Medicine

## 2010-01-11 ENCOUNTER — Encounter: Payer: Self-pay | Admitting: Gastroenterology

## 2010-01-11 ENCOUNTER — Ambulatory Visit: Payer: Self-pay | Admitting: Family Medicine

## 2010-01-11 DIAGNOSIS — D649 Anemia, unspecified: Secondary | ICD-10-CM | POA: Insufficient documentation

## 2010-01-11 DIAGNOSIS — Z8719 Personal history of other diseases of the digestive system: Secondary | ICD-10-CM

## 2010-01-12 ENCOUNTER — Ambulatory Visit: Payer: Self-pay | Admitting: Family Medicine

## 2010-01-15 ENCOUNTER — Telehealth (INDEPENDENT_AMBULATORY_CARE_PROVIDER_SITE_OTHER): Payer: Self-pay | Admitting: *Deleted

## 2010-01-15 ENCOUNTER — Encounter: Payer: Self-pay | Admitting: Family Medicine

## 2010-01-15 LAB — CONVERTED CEMR LAB
AST: 20 units/L (ref 0–37)
Alkaline Phosphatase: 37 units/L — ABNORMAL LOW (ref 39–117)
BUN: 20 mg/dL (ref 6–23)
Basophils Absolute: 0 10*3/uL (ref 0.0–0.1)
Calcium: 9 mg/dL (ref 8.4–10.5)
Creatinine,U: 199.4 mg/dL
GFR calc non Af Amer: 37.58 mL/min (ref >60)
Glucose, Bld: 125 mg/dL — ABNORMAL HIGH (ref 70–99)
HDL: 28.9 mg/dL — ABNORMAL LOW (ref >39.00)
Hemoglobin: 9.2 g/dL — ABNORMAL LOW (ref 13.0–17.0)
LDL Cholesterol: 41 mg/dL (ref 0–99)
Lymphocytes Relative: 22.7 % (ref 12.0–46.0)
Microalb Creat Ratio: 1 mg/g (ref 0.0–30.0)
Monocytes Relative: 5.7 % (ref 3.0–12.0)
PSA: 0.13 ng/mL (ref 0.10–4.00)
Platelets: 221 10*3/uL (ref 150.0–400.0)
RDW: 18.5 % — ABNORMAL HIGH (ref 11.5–14.6)
Sodium: 140 meq/L (ref 135–145)
Total Bilirubin: 0.6 mg/dL (ref 0.3–1.2)
VLDL: 25.8 mg/dL (ref 0.0–40.0)
WBC: 4.3 10*3/uL — ABNORMAL LOW (ref 4.5–10.5)

## 2010-01-17 ENCOUNTER — Encounter: Payer: Self-pay | Admitting: Family Medicine

## 2010-01-18 ENCOUNTER — Telehealth (INDEPENDENT_AMBULATORY_CARE_PROVIDER_SITE_OTHER): Payer: Self-pay | Admitting: *Deleted

## 2010-01-23 ENCOUNTER — Encounter: Payer: Self-pay | Admitting: Infectious Diseases

## 2010-01-23 ENCOUNTER — Encounter: Payer: Self-pay | Admitting: Family Medicine

## 2010-01-30 ENCOUNTER — Encounter: Payer: Self-pay | Admitting: Infectious Diseases

## 2010-01-30 ENCOUNTER — Encounter: Payer: Self-pay | Admitting: Family Medicine

## 2010-02-04 ENCOUNTER — Encounter: Payer: Self-pay | Admitting: Family Medicine

## 2010-02-05 ENCOUNTER — Telehealth: Payer: Self-pay | Admitting: Family Medicine

## 2010-02-05 ENCOUNTER — Telehealth (INDEPENDENT_AMBULATORY_CARE_PROVIDER_SITE_OTHER): Payer: Self-pay | Admitting: *Deleted

## 2010-02-06 ENCOUNTER — Telehealth (INDEPENDENT_AMBULATORY_CARE_PROVIDER_SITE_OTHER): Payer: Self-pay | Admitting: *Deleted

## 2010-02-06 ENCOUNTER — Telehealth: Payer: Self-pay | Admitting: Family Medicine

## 2010-02-06 DIAGNOSIS — M86179 Other acute osteomyelitis, unspecified ankle and foot: Secondary | ICD-10-CM | POA: Insufficient documentation

## 2010-02-07 ENCOUNTER — Encounter: Payer: Self-pay | Admitting: Family Medicine

## 2010-02-13 ENCOUNTER — Ambulatory Visit: Payer: Self-pay | Admitting: Gastroenterology

## 2010-02-14 ENCOUNTER — Telehealth: Payer: Self-pay | Admitting: Family Medicine

## 2010-02-15 ENCOUNTER — Encounter: Payer: Self-pay | Admitting: Gastroenterology

## 2010-02-15 ENCOUNTER — Encounter (HOSPITAL_BASED_OUTPATIENT_CLINIC_OR_DEPARTMENT_OTHER): Admission: RE | Admit: 2010-02-15 | Payer: Self-pay | Source: Home / Self Care | Admitting: Family Medicine

## 2010-03-01 ENCOUNTER — Telehealth (INDEPENDENT_AMBULATORY_CARE_PROVIDER_SITE_OTHER): Payer: Self-pay | Admitting: *Deleted

## 2010-03-22 ENCOUNTER — Ambulatory Visit: Admit: 2010-03-22 | Payer: Self-pay | Admitting: Pulmonary Disease

## 2010-04-01 LAB — CONVERTED CEMR LAB: INR: 2.4

## 2010-04-05 NOTE — Miscellaneous (Signed)
Summary: Care Plan/[facility]  Care Plan/[facility]   Imported By: Lanelle Bal 05/08/2009 09:25:39  _____________________________________________________________________  External Attachment:    Type:   Image     Comment:   External Document

## 2010-04-05 NOTE — Miscellaneous (Signed)
Summary: PT Orders/[facility]  PT Orders/[facility]   Imported By: Lanelle Bal 04/22/2009 08:39:26  _____________________________________________________________________  External Attachment:    Type:   Image     Comment:   External Document

## 2010-04-05 NOTE — Progress Notes (Signed)
Summary: -lab result  Phone Note Outgoing Call   Call placed by: [facility] CMA,  October 18, 2009 9:52 AM Details for Reason: con't b12 injections monthly----pt is still anemic----take iron 325 daily recheck cbcd and ibc 1 month---285.9  Summary of Call: left message to call  office..................Marland KitchenFelecia Deloach CMA  October 18, 2009 9:52 AM  left message to call  office.............Marland KitchenFelecia Deloach CMA  October 19, 2009 9:21 AM   left detail message with lab result, copy mailed to pt as well........Marland KitchenFelecia Deloach CMA  October 24, 2009 11:11 AM

## 2010-04-05 NOTE — Miscellaneous (Signed)
Summary: SN & HA Orders/[facility]  SN & HA Orders/[facility]   Imported By: Lanelle Bal 01/09/2010 14:12:19  _____________________________________________________________________  External Attachment:    Type:   Image     Comment:   External Document

## 2010-04-05 NOTE — Progress Notes (Signed)
Summary: lm 12/5  Phone Note Refill Request Call back at Home Phone [phone] Message from:  Spouse on February 05, 2010 11:34 AM  Patient spouse called for refills of meds. I tried to call and find out which ones, no answer/no machine. Lucious Groves CMA  February 05, 2010 11:36 AM   Lm tcb. Lucious Groves CMA  February 05, 2010 12:05 PM

## 2010-04-05 NOTE — Miscellaneous (Signed)
Summary: SN Orders/[facility]  SN Orders/[facility]   Imported By: Lanelle Bal 09/26/2009 08:19:39  _____________________________________________________________________  External Attachment:    Type:   Image     Comment:   External Document

## 2010-04-05 NOTE — Miscellaneous (Signed)
Summary: MS Orders/[facility]  MS Orders/[facility]   Imported By: Lanelle Bal 04/20/2009 09:54:44  _____________________________________________________________________  External Attachment:    Type:   Image     Comment:   External Document

## 2010-04-05 NOTE — Progress Notes (Signed)
Summary: med change  Phone Note Refill Request Message from:  Fax from Pharmacy on [facility] fax [phone]  Refills Requested: Medication #1:  KAON-CL-10 10 MEQ CR-TABS 1 by mouth daily klor-con m10 er tablets.  Requesting  med change to rewrite this prescription as a generic.  Initial call taken by: Barb Merino,  March 06, 2009 12:55 PM  Follow-up for Phone Call        ok  Follow-up by: Loreen Freud DO,  March 06, 2009 3:17 PM    New/Updated Medications: KLOR-CON 10 10 MEQ CR-TABS (POTASSIUM CHLORIDE) 1 by mouth daily. Prescriptions: KLOR-CON 10 10 MEQ CR-TABS (POTASSIUM CHLORIDE) 1 by mouth daily.  #30 x 2   Entered by:   Army Fossa CMA   Authorized by:   Loreen Freud DO   Signed by:   Army Fossa CMA on 03/06/2009   Method used:   Electronically to        [id number]* (retail)       [address] [place], [place]  [address]       Ph: [phone]       Fax: [phone]   RxID:   [id number] KLOR-CON 10 10 MEQ CR-TABS (POTASSIUM CHLORIDE) 1 by mouth daily.  #30 x 2   Entered by:   Army Fossa CMA   Authorized by:   Loreen Freud DO   Signed by:   Army Fossa CMA on 03/06/2009   Method used:   Electronically to        [facility] [phone]* (retail)       [address] [place], [place]  [address]       Ph: [phone] or [phone]       Fax: [phone]   RxID:   [id number]  called [facility] and cancelled the rx that was sent there.

## 2010-04-05 NOTE — Progress Notes (Signed)
Summary: painful urination  Phone Note Call from Patient   Caller: Patient Summary of Call: pt wife left VM that pt is c/o painful urination and that pt has pending OV with nephrology on 09-06-09 but does not think that he can wait that long.  Call pt wife back advise wife pt will need to give urine sample to get treatment and in order to receive med pt will need to be seen by physician. Pt wife denies any abdominal pain, swelling, blood in urine or fever. Pt wife offer OV, pt wife states that she need to go home speak with husband to see if he is willing to come in. Advise wife if pain symptoms worsen and starts to have fever,or blood in urine pt need to be evaluated at [facility], pt wife ok will call back if pt would like OV............Marland KitchenFelecia Deloach CMA  August 31, 2009 4:46 PM   Follow-up for Phone Call        noted and agree Follow-up by: Neena Rhymes MD,  August 31, 2009 4:53 PM

## 2010-04-05 NOTE — Progress Notes (Signed)
Summary: FYI from Nurse  Phone Note Other Incoming   Caller: Amy from [facility] Summary of Call: Was just calling to let us know that his wounds have been responding well to the hydrogel. He also is having a few skin tears that she asked if it was ok to cover with a duoderm while they heal. She has done this is the past and it has helped.  Initial call taken by: Harold Barban,  September 15, 2009 2:07 PM  Follow-up for Phone Call        yes that is fine Follow-up by: Loreen Freud DO,  September 15, 2009 2:35 PM  Additional Follow-up for Phone Call Additional follow up Details #1::        left detail message on nurse VM with dr Laury Axon suggestion and to call with any further concerns...........Marland KitchenFelecia Deloach CMA  September 15, 2009 5:12 PM

## 2010-04-05 NOTE — Progress Notes (Signed)
Summary: Home Health B-12 inj/testing  Phone Note From Other Clinic Call back at [phone] [facility]   Caller: [facility] Jean Rosenthal [phone] Summary of Call: Christianne Borrow called stating that in order for patient b-12 inj to be paid for by medicare the dx code must be pernicious anemia. If his dx is not pernicious anemia then they cannot do the b-12 inj. They do not have a dx at all for the pt b-12 and  also need verbal order for the b-12 in (q month)j and b-12 testing (q o month).  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 Please advise. Initial call taken by: Lucious Groves CMA,  October 05, 2009 9:44 AM  Follow-up for Phone Call        will they cover neuropathy?  He does not have pernicious anemia. Follow-up by: Loreen Freud DO,  October 05, 2009 5:14 PM  Additional Follow-up for Phone Call Additional follow up Details #1::        left message on voicemail to call back to office. Lucious Groves CMA  October 06, 2009 9:55 AM   Britta Mccreedy will check and call me back. Lucious Groves CMA  October 06, 2009 10:24 AM   Per Britta Mccreedy, neuropathy will not get B-12 covered. They will have to discharge the patient. Please advise. 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)   Additional Follow-up for Phone Call Additional follow up Details #2::    I added pernicious anemia to problems Follow-up by: Loreen Freud DO,  October 06, 2009 1:14 PM  Additional Follow-up for Phone Call Additional follow up Details #3:: Details for Additional Follow-up Action Taken: SPOKE WITH [facility] PT NEEDS TO HAVE LABS DRAW TO CONFIRM NEW DX AND RESULT NEED TO BE FAXED TO [phone].............Marland KitchenFelecia Deloach CMA  October 09, 2009 2:43 PM   per dr Laury Axon b12level, cbcd 281.0.............Marland KitchenFelecia Deloach CMA  October 09, 2009 4:27 PM  pt will need to have the following labs drawn to continue b12 at home. 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

## 2010-04-05 NOTE — Procedures (Signed)
Summary: Oximetry/[facility]  Oximetry/[facility]   Imported By: Lanelle Bal 05/03/2009 13:01:49  _____________________________________________________________________  External Attachment:    Type:   Image     Comment:   External Document

## 2010-04-05 NOTE — Progress Notes (Signed)
Summary: rx, orders  Phone Note From Other Clinic Call back at [facility] Phone [phone] Call back at [phone]   Caller: [facility](cheryl) Summary of Call: nurse called stating that pt urine has bad smell and would like a order to obtain a sample. The pt also has a fungal infection in scrotum area that extends to the rectal and thigh area that may be due to pt sitting in a wet diaper. pt family state that they have tried OTC things to prevent this but have had little relief. the nurse would like to know if you would be ok with rx a cream or diflucan for the rash. dr Laury Axon pls advise.................Marland KitchenFelecia Deloach CMA  March 29, 2009 4:45 PM   the nurse is aware dr Laury Axon is out of the office this pm will return in am. nurse ok not urgent matter will not be able to collect sample until tomorrow.................Marland KitchenFelecia Deloach CMA  March 29, 2009 5:03 PM   Follow-up for Phone Call        ok to do UA c&S Lotrisone cream for rash 45g  apply two times a day for 2 weeks Follow-up by: Loreen Freud DO,  March 29, 2009 7:55 PM  Additional Follow-up for Phone Call Additional follow up Details #1::        VERBAL OK FOR UA C&S GIVEN TO NURSE. left message to call office to verify pharmacy....................Marland KitchenFelecia Deloach CMA  March 30, 2009 8:38 AM   pt wife aware rx sent to pharmacy............Marland KitchenFelecia Deloach CMA  March 30, 2009 1:35 PM     New/Updated Medications: LOTRISONE 1-0.05 % CREA (CLOTRIMAZOLE-BETAMETHASONE) apply two times a day for 2 weeks Prescriptions: LOTRISONE 1-0.05 % CREA (CLOTRIMAZOLE-BETAMETHASONE) apply two times a day for 2 weeks  #45g x 0   Entered by:   Jeremy Johann CMA   Authorized by:   Loreen Freud DO   Signed by:   Jeremy Johann CMA on 03/30/2009   Method used:   Faxed to ...       [facility] [address] #[id number]* (retail)       [address] [place], [place]  [address]       Ph: [phone]       Fax: [phone]   RxID:    [id number]

## 2010-04-05 NOTE — Progress Notes (Signed)
Summary: refill  Phone Note Refill Request Message from:  Fax from Pharmacy  Refills Requested: Medication #1:  GABAPENTIN 300 MG CAPS Take one capsule every 8 hours as needed   Last Refilled: 05/23/2009 Pharm- [facility] [address]   Follow-up for Phone Call        01/05/09. Army Fossa CMA  June 21, 2009 9:39 AM   Additional Follow-up for Phone Call Additional follow up Details #1::        refill for 1 year Additional Follow-up by: Loreen Freud DO,  June 21, 2009 11:13 AM    Prescriptions: GABAPENTIN 300 MG CAPS (GABAPENTIN) Take one capsule every 8 hours as needed  #90.0 Each x 3   Entered by:   Army Fossa CMA   Authorized by:   Loreen Freud DO   Signed by:   Army Fossa CMA on 06/21/2009   Method used:   Electronically to        [facility] [address] #[id number]* (retail)       [address] [place], [place]  [address]       Ph: [phone]       Fax: [phone]   RxID:   [id number]

## 2010-04-05 NOTE — Progress Notes (Signed)
Summary: Regarding urine culture  Phone Note Call from Patient   Summary of Call: I spoke with [name] pts home health nurse and she states his incontienence has gotten worse and his urine has a very strong odor to it. Please advise.  Initial call taken by: Army Fossa CMA,  April 14, 2009 9:42 AM  Follow-up for Phone Call        cipro 500 two times a day for 5 days---recheck UA c&s in 2 weeks Follow-up by: Loreen Freud DO,  April 14, 2009 10:51 AM  Additional Follow-up for Phone Call Additional follow up Details #1::        Pts home health nurse is aware, and pts wife. Army Fossa CMA  April 14, 2009 11:14 AM     New/Updated Medications: CIPRO 500 MG TABS (CIPROFLOXACIN HCL) 1 by mouth two times a day for 5 days. Prescriptions: CIPRO 500 MG TABS (CIPROFLOXACIN HCL) 1 by mouth two times a day for 5 days.  #10 x 0   Entered by:   Army Fossa CMA   Authorized by:   Loreen Freud DO   Signed by:   Army Fossa CMA on 04/14/2009   Method used:   Electronically to        [id number]* (retail)       [address] [place], [place]  [address]       Ph: [phone]       Fax: [phone]   RxID:   [id number]

## 2010-04-05 NOTE — Progress Notes (Signed)
Summary: ? pink eye-FYI  Phone Note Call from Patient Call back at [phone]   Caller: [facility] Summary of Call: Nurse called to report pt has redness, puffiness, a burning sensation and some drainage from left eye. Per nurse it looks to be pink eye. pls advise.............Marland KitchenFelecia Deloach CMA  Jul 25, 2009 12:38 PM  Initial call taken by: Jeremy Johann CMA,  Jul 25, 2009 12:37 PM  Follow-up for Phone Call        vigamox 1 gtt in affected eye three times a day for 7 days---  will need ov with eye dr if it does not improve in 2-3 days Follow-up by: Loreen Freud DO,  Jul 25, 2009 12:53 PM  Additional Follow-up for Phone Call Additional follow up Details #1::        I informed pts home health nurse. She also informed me that he has been practicing getting in and out of the car and has fallen 3 times. She states there have been no injuries. But they are practicing because this is why he has not been anywhere. Army Fossa CMA  Jul 25, 2009 12:56 PM     Additional Follow-up for Phone Call Additional follow up Details #2::    The may need to consider assisted living or NH?  Follow-up by: Loreen Freud DO,  Jul 25, 2009 1:08 PM  Additional Follow-up for Phone Call Additional follow up Details #3:: Details for Additional Follow-up Action Taken: I informed home health nurse, asked her to approach them. She states that he is only falling when he has to get in and out the car. Army Fossa CMA  Jul 25, 2009 1:14 PM   New/Updated Medications: VIGAMOX 0.5 % SOLN (MOXIFLOXACIN HCL) 1 gtt in affected eye three times a day for 7 days. Prescriptions: VIGAMOX 0.5 % SOLN (MOXIFLOXACIN HCL) 1 gtt in affected eye three times a day for 7 days.  #1 bottle x 0   Entered by:   Army Fossa CMA   Authorized by:   Loreen Freud DO   Signed by:   Army Fossa CMA on 07/25/2009   Method used:   Electronically to        [facility] [phone]* (retail)       [address]  [place], [place]  [address]       Ph: [phone] or [phone]       Fax: [phone]   RxID:   [id number]

## 2010-04-05 NOTE — Assessment & Plan Note (Signed)
Summary: fasting 30 min appt//lch - rescd cbs   Vital Signs:  Patient profile:   70 year old male Temp:     97.6 degrees F oral Pulse rate:   88 / minute Pulse rhythm:   regular BP sitting:   132 / 76  (left arm) Cuff size:   large  Vitals Entered By: Army Fossa CMA (August 08, 2009 9:43 AM) CC: Pt here follow up appt, fasting.    History of Present Illness:  Hyperlipidemia follow-up      This is a 70 year old man who presents for Hyperlipidemia follow-up.  The patient denies muscle aches, GI upset, abdominal pain, flushing, itching, constipation, diarrhea, and fatigue.  The patient denies the following symptoms: chest pain/pressure, exercise intolerance, dypsnea, palpitations, syncope, and pedal edema.  Compliance with medications (by patient report) has been near 100%.  Dietary compliance has been fair.  The patient reports no exercise.    Hypertension follow-up      The patient also presents for Hypertension follow-up.  The patient denies lightheadedness, urinary frequency, headaches, edema, impotence, rash, and fatigue.  The patient denies the following associated symptoms: chest pain, chest pressure, exercise intolerance, dyspnea, palpitations, syncope, leg edema, and pedal edema.  Compliance with medications (by patient report) has been near 100%.  The patient reports that dietary compliance has been fair.  The patient reports no exercise.  Adjunctive measures currently used by the patient include salt restriction.    Type 1 diabetes mellitus follow-up      The patient is also here for Type 2 diabetes mellitus follow-up.  The patient denies polyuria, polydipsia, blurred vision, self managed hypoglycemia, hypoglycemia requiring help, weight loss, weight gain, and numbness of extremities.  Other symptoms include neuropathic pain and foot ulcer.  Since the last visit the patient reports poor dietary compliance, compliance with medications, not exercising regularly, and monitoring blood  glucose.  The patient has been measuring capillary blood glucose before breakfast.  Since the last visit, the patient reports having had eye care by an ophthalmologist.    Pt also c/o d/c from R eye--- he used all viagmox on L eye.  Allergies (verified): No Known Drug Allergies  Physical Exam  General:  overweight-appearing and disheveled.   Eyes:  R eye + yellow d/c  sclera injected Neck:  No deformities, masses, or tenderness noted. Lungs:  Normal respiratory effort, chest expands symmetrically. Lungs are clear to auscultation, no crackles or wheezes. Heart:  normal rate and no murmur.   Extremities:  R BKA  Skin:  Intact without suspicious lesions or rashes Psych:  Oriented X3, good eye contact, and not suicidal.     Impression & Recommendations:  Problem # 1:  HYPOTHYROIDISM NOS (ICD-244.9)  His updated medication list for this problem includes:    Levothroid 200 Mcg Tabs (Levothyroxine sodium) .Marland Kitchen... Take one tablet daily.  Orders: Venipuncture (42706) TLB-B12 + Folate Pnl (23762_83151-V61/YWV) TLB-IBC Pnl (Iron/FE;Transferrin) (83550-IBC) TLB-Lipid Panel (80061-LIPID) TLB-BMP (Basic Metabolic Panel-BMET) (80048-METABOL) TLB-CBC Platelet - w/Differential (85025-CBCD) TLB-Hepatic/Liver Function Pnl (80076-HEPATIC) TLB-TSH (Thyroid Stimulating Hormone) (84443-TSH) TLB-Uric Acid, Blood (84550-URIC) TLB-A1C / Hgb A1C (Glycohemoglobin) (83036-A1C) TLB-Microalbumin/Creat Ratio, Urine (82043-MALB)  Labs Reviewed: TSH: 0.76 (11/16/2008)    HgBA1c: 6.2 (08/19/2008) Chol: 156 (08/19/2008)   HDL: 37.00 (08/19/2008)   LDL: 88 (08/19/2008)   TG: 157.0 (08/19/2008)  Problem # 2:  RENAL INSUFFICIENCY (ICD-588.9)  Orders: Venipuncture (37106) TLB-B12 + Folate Pnl (26948_54627-O35/KKX) TLB-IBC Pnl (Iron/FE;Transferrin) (83550-IBC) TLB-Lipid Panel (80061-LIPID) TLB-BMP (Basic Metabolic Panel-BMET) (  80048-METABOL) TLB-CBC Platelet - w/Differential  (85025-CBCD) TLB-Hepatic/Liver Function Pnl (80076-HEPATIC) TLB-TSH (Thyroid Stimulating Hormone) (84443-TSH) TLB-Uric Acid, Blood (84550-URIC) TLB-A1C / Hgb A1C (Glycohemoglobin) (83036-A1C) TLB-Microalbumin/Creat Ratio, Urine (82043-MALB)  Problem # 3:  B12 DEFICIENCY (ICD-266.2)  Orders: Venipuncture (56213) TLB-B12 + Folate Pnl (08657_84696-E95/MWU) TLB-IBC Pnl (Iron/FE;Transferrin) (83550-IBC) TLB-Lipid Panel (80061-LIPID) TLB-BMP (Basic Metabolic Panel-BMET) (80048-METABOL) TLB-CBC Platelet - w/Differential (85025-CBCD) TLB-Hepatic/Liver Function Pnl (80076-HEPATIC) TLB-TSH (Thyroid Stimulating Hormone) (84443-TSH) TLB-Uric Acid, Blood (84550-URIC) TLB-A1C / Hgb A1C (Glycohemoglobin) (83036-A1C) TLB-Microalbumin/Creat Ratio, Urine (82043-MALB)  Problem # 4:  HYPERTENSION (ICD-401.9)  His updated medication list for this problem includes:    Furosemide 20 Mg Tabs (Furosemide) .Marland Kitchen... Take one tablet daily    Diltiazem Hcl Er Beads 240 Mg Xr24h-cap (Diltiazem hcl er beads) .Marland Kitchen... 1 by mouth daily  Orders: Venipuncture (13244) TLB-B12 + Folate Pnl (01027_25366-Y40/HKV) TLB-IBC Pnl (Iron/FE;Transferrin) (83550-IBC) TLB-Lipid Panel (80061-LIPID) TLB-BMP (Basic Metabolic Panel-BMET) (80048-METABOL) TLB-CBC Platelet - w/Differential (85025-CBCD) TLB-Hepatic/Liver Function Pnl (80076-HEPATIC) TLB-TSH (Thyroid Stimulating Hormone) (84443-TSH) TLB-Uric Acid, Blood (84550-URIC) TLB-A1C / Hgb A1C (Glycohemoglobin) (83036-A1C) TLB-Microalbumin/Creat Ratio, Urine (82043-MALB)  BP today: 132/76 Prior BP: 124/86 (01/05/2009)  Prior 10 Yr Risk Heart Disease: 22 % (04/03/2007)  Labs Reviewed: K+: 4.4 (08/19/2008) Creat: : 1.6 (08/19/2008)   Chol: 156 (08/19/2008)   HDL: 37.00 (08/19/2008)   LDL: 88 (08/19/2008)   TG: 157.0 (08/19/2008)  Problem # 5:  HYPERLIPIDEMIA (ICD-272.4)  The following medications were removed from the medication list:    Lovaza 1 Gm Caps (Omega-3-acid  ethyl esters) .Marland Kitchen... Take two capsules twice daily. His updated medication list for this problem includes:    Crestor 20 Mg Tabs (Rosuvastatin calcium) .Marland Kitchen... Take one tablet daily    Fenofibrate 160 Mg Tabs (Fenofibrate) .Marland Kitchen... Take  1 by mouth once daily  Orders: Venipuncture (42595) TLB-B12 + Folate Pnl (63875_64332-R51/OAC) TLB-IBC Pnl (Iron/FE;Transferrin) (83550-IBC) TLB-Lipid Panel (80061-LIPID) TLB-BMP (Basic Metabolic Panel-BMET) (80048-METABOL) TLB-CBC Platelet - w/Differential (85025-CBCD) TLB-Hepatic/Liver Function Pnl (80076-HEPATIC) TLB-TSH (Thyroid Stimulating Hormone) (84443-TSH) TLB-Uric Acid, Blood (84550-URIC) TLB-A1C / Hgb A1C (Glycohemoglobin) (83036-A1C) TLB-Microalbumin/Creat Ratio, Urine (82043-MALB)  Labs Reviewed: SGOT: 23 (08/19/2008)   SGPT: 14 (08/19/2008)  Prior 10 Yr Risk Heart Disease: 22 % (04/03/2007)   HDL:37.00 (08/19/2008), 24.9 (02/10/2008)  LDL:88 (08/19/2008), 56 (02/10/2008)  Chol:156 (08/19/2008), 117 (02/10/2008)  Trig:157.0 (08/19/2008), 180 (02/10/2008)  Problem # 6:  GOUT (ICD-274.9)  His updated medication list for this problem includes:    Colchicine 0.6 Mg Tabs (Colchicine) .Marland Kitchen... Take two tablets daily.    Allopurinol 100 Mg Tabs (Allopurinol) .Marland Kitchen... 1 by mouth once daily  Orders: Venipuncture (16606) TLB-B12 + Folate Pnl (30160_10932-T55/DDU) TLB-IBC Pnl (Iron/FE;Transferrin) (83550-IBC) TLB-Lipid Panel (80061-LIPID) TLB-BMP (Basic Metabolic Panel-BMET) (80048-METABOL) TLB-CBC Platelet - w/Differential (85025-CBCD) TLB-Hepatic/Liver Function Pnl (80076-HEPATIC) TLB-TSH (Thyroid Stimulating Hormone) (84443-TSH) TLB-Uric Acid, Blood (84550-URIC) TLB-A1C / Hgb A1C (Glycohemoglobin) (83036-A1C) TLB-Microalbumin/Creat Ratio, Urine (82043-MALB)  Elevate extremity; warm compresses, symptomatic relief and medication as directed.   Problem # 7:  DIABETES MELLITUS, TYPE II (ICD-250.00)  His updated medication list for this problem  includes:    Actos 45 Mg Tabs (Pioglitazone hcl) .Marland Kitchen... Take 1 by mouth daily.    Lantus Solostar 100 Unit/ml Soln (Insulin glargine) .Marland KitchenMarland KitchenMarland KitchenMarland Kitchen 75 u subcutaneously two times a day    Humalog Kwikpen 100 Unit/ml Soln (Insulin lispro (human)) .Marland Kitchen... 25 subcutaneously three times a day with meals    Baby Aspirin 81 Mg Chew (Aspirin) .Marland Kitchen... Take one tablet daily.  Orders: Venipuncture (20254) TLB-B12 + Folate Pnl (27062_37628-B15/VVO) TLB-IBC Pnl (Iron/FE;Transferrin) (83550-IBC) TLB-Lipid  Panel (80061-LIPID) TLB-BMP (Basic Metabolic Panel-BMET) (80048-METABOL) TLB-CBC Platelet - w/Differential (85025-CBCD) TLB-Hepatic/Liver Function Pnl (80076-HEPATIC) TLB-TSH (Thyroid Stimulating Hormone) (84443-TSH) TLB-Uric Acid, Blood (84550-URIC) TLB-A1C / Hgb A1C (Glycohemoglobin) (83036-A1C) TLB-Microalbumin/Creat Ratio, Urine (82043-MALB)  Labs Reviewed: Creat: 1.6 (08/19/2008)    Reviewed HgBA1c results: 6.2 (08/19/2008)  5.0 (02/10/2008)  Problem # 8:  DEPRESSION (ICD-311)  The following medications were removed from the medication list:    Prozac 20 Mg Caps (Fluoxetine hcl) .Marland Kitchen... Take one tablet twice daily His updated medication list for this problem includes:    Fluoxetine Hcl 20 Mg Caps (Fluoxetine hcl) .Marland Kitchen... Take 1 by mouth two times a day.  Problem # 9:  CONJUNCTIVITIS, BACTERIAL (ICD-372.03)  His updated medication list for this problem includes:    Vigamox 0.5 % Soln (Moxifloxacin hcl) .Marland Kitchen... 1 gtt in affected eye three times a day for 7 days.  Discussed treatment, and urged patient to wash hands carefully after touching face. --- f/u with optho if no better in 2-3 days  Complete Medication List: 1)  Crestor 20 Mg Tabs (Rosuvastatin calcium) .... Take one tablet daily 2)  Levothroid 200 Mcg Tabs (Levothyroxine sodium) .... Take one tablet daily. 3)  Actos 45 Mg Tabs (Pioglitazone hcl) .... Take 1 by mouth daily. 4)  Lantus Solostar 100 Unit/ml Soln (Insulin glargine) .... 75 u  subcutaneously two times a day 5)  Vicodin Es 7.5-750 Mg Tabs (Hydrocodone-acetaminophen) .Marland Kitchen.. 1 by mouth every 6 hours as needed 6)  Fluoxetine Hcl 20 Mg Caps (Fluoxetine hcl) .... Take 1 by mouth two times a day. 7)  Humalog Kwikpen 100 Unit/ml Soln (Insulin lispro (human)) .... 25 subcutaneously three times a day with meals 8)  Colchicine 0.6 Mg Tabs (Colchicine) .... Take two tablets daily. 9)  Baby Aspirin 81 Mg Chew (Aspirin) .... Take one tablet daily. 10)  Furosemide 20 Mg Tabs (Furosemide) .... Take one tablet daily 11)  [facility] Syr/ndl 31g 0.70ml 8mm  .... Use as directed 4 times daily 12)  Ascensia Contour Test Strp (Glucose blood) .... Use as directed 13)  Gabapentin 300 Mg Caps (Gabapentin) .... Take one capsule every 8 hours as needed 14)  Allopurinol 100 Mg Tabs (Allopurinol) .Marland Kitchen.. 1 by mouth once daily 15)  Fenofibrate 160 Mg Tabs (Fenofibrate) .... Take  1 by mouth once daily 16)  Bd Pen Needle Mini U/f 31g X 5 Mm Misc (Insulin pen needle) .... Pt injects 5 times a day 17)  Tamsulosin Hcl 0.4 Mg Caps (Tamsulosin hcl) .... Take 1 capsule by mouth daily. 18)  Ropinirole Hcl 2 Mg Tabs (Ropinirole hcl) .Marland Kitchen.. 1 by mouth daily. 19)  Diltiazem Hcl Er Beads 240 Mg Xr24h-cap (Diltiazem hcl er beads) .Marland Kitchen.. 1 by mouth daily 20)  Poly-iron 150 150 Mg Caps (Polysaccharide iron complex) .... Take as directed 21)  Bayer Contour Test Strp (Glucose blood) .... As directed 22)  Klor-con 10 10 Meq Cr-tabs (Potassium chloride) .Marland Kitchen.. 1 by mouth daily. 23)  Clotrimazole-betamethasone 1-0.05 % Crea (Clotrimazole-betamethasone) .... Apply two times a day for 2 weeks. 24)  B12 Injections  .Marland Kitchen.. 1 injection im weekly for 4 weeks, then recheck level. dx- 266.2 25)  Cyanocobalamin 1000 Mcg/ml Soln (Cyanocobalamin) .Marland Kitchen.. 1 ml weekly x4 then 1ml im monthly 26)  Vigamox 0.5 % Soln (Moxifloxacin hcl) .Marland Kitchen.. 1 gtt in affected eye three times a day for 7 days. Prescriptions: VIGAMOX 0.5 % SOLN (MOXIFLOXACIN HCL) 1  gtt in affected eye three times a day for 7 days.  #1 bottle  x 0   Entered and Authorized by:   Loreen Freud DO   Signed by:   Loreen Freud DO on 08/08/2009   Method used:   Print then Give to Patient   RxID:   [id number] KLOR-CON 10 10 MEQ CR-TABS (POTASSIUM CHLORIDE) 1 by mouth daily.  #30 Each x 5   Entered and Authorized by:   Loreen Freud DO   Signed by:   Loreen Freud DO on 08/08/2009   Method used:   Print then Give to Patient   RxID:   [id number] BAYER CONTOUR TEST  STRP (GLUCOSE BLOOD) as directed  #60 x 11   Entered and Authorized by:   Loreen Freud DO   Signed by:   Loreen Freud DO on 08/08/2009   Method used:   Print then Give to Patient   RxID:   [id number] POLY-IRON 150 150 MG CAPS (POLYSACCHARIDE IRON COMPLEX) take as directed  #30 x 6   Entered and Authorized by:   Loreen Freud DO   Signed by:   Loreen Freud DO on 08/08/2009   Method used:   Print then Give to Patient   RxID:   [id number] DILTIAZEM HCL ER BEADS 240 MG XR24H-CAP (DILTIAZEM HCL ER BEADS) 1 by mouth daily  #30 Each x 5   Entered and Authorized by:   Loreen Freud DO   Signed by:   Loreen Freud DO on 08/08/2009   Method used:   Print then Give to Patient   RxID:   [id number] ROPINIROLE HCL 2 MG TABS (ROPINIROLE HCL) 1 by mouth daily.  #30 Each x 5   Entered and Authorized by:   Loreen Freud DO   Signed by:   Loreen Freud DO on 08/08/2009   Method used:   Print then Give to Patient   RxID:   [id number] TAMSULOSIN HCL 0.4 MG CAPS (TAMSULOSIN HCL) take 1 capsule by mouth daily.  #30.0 Each x 5   Entered and Authorized by:   Loreen Freud DO   Signed by:   Loreen Freud DO on 08/08/2009   Method used:   Print then Give to Patient   RxID:   [id number] BD PEN NEEDLE MINI U/F 31G X 5 MM MISC (INSULIN PEN NEEDLE) PT INJECTS 5 TIMES A DAY  #100 Each x 11   Entered and Authorized by:   Loreen Freud DO   Signed by:   Loreen Freud DO on 08/08/2009   Method used:   Print  then Give to Patient   RxID:   [id number] FENOFIBRATE 160 MG TABS (FENOFIBRATE) Take  1 by mouth once daily  #90 x 3   Entered and Authorized by:   Loreen Freud DO   Signed by:   Loreen Freud DO on 08/08/2009   Method used:   Print then Give to Patient   RxID:   [id number] ALLOPURINOL 100 MG TABS (ALLOPURINOL) 1 by mouth once daily  #30.0 Each x 5   Entered and Authorized by:   Loreen Freud DO   Signed by:   Loreen Freud DO on 08/08/2009   Method used:   Print then Give to Patient   RxID:   [id number] GABAPENTIN 300 MG CAPS (GABAPENTIN) Take one capsule every 8 hours as needed  #90.0 Each x 5   Entered and Authorized by:   Loreen Freud DO   Signed by:   Loreen Freud DO on 08/08/2009   Method used:   Print then Give to Patient   RxID:  [id number] ASCENSIA CONTOUR TEST   STRP (GLUCOSE BLOOD) use as directed  #100 Each x 11   Entered and Authorized by:   Loreen Freud DO   Signed by:   Loreen Freud DO on 08/08/2009   Method used:   Print then Give to Patient   RxID:   [id number] [facility] SYR/NDL 31G 0.5ML Use as directed 4 times daily  #100 x 3   Entered and Authorized by:   Loreen Freud DO   Signed by:   Loreen Freud DO on 08/08/2009   Method used:   Print then Give to Patient   RxID:   [id number] FUROSEMIDE 20 MG  TABS (FUROSEMIDE) take one tablet daily  #30 Each x 5   Entered and Authorized by:   Loreen Freud DO   Signed by:   Loreen Freud DO on 08/08/2009   Method used:   Print then Give to Patient   RxID:   [id number] COLCHICINE 0.6 MG  TABS (COLCHICINE) TAKE TWO TABLETS DAILY.  #60 x 5   Entered and Authorized by:   Loreen Freud DO   Signed by:   Loreen Freud DO on 08/08/2009   Method used:   Print then Give to Patient   RxID:   [id number] HUMALOG KWIKPEN 100 UNIT/ML SOLN (INSULIN LISPRO (HUMAN)) 25 subcutaneously three times a day with meals  #1 month x 5   Entered and Authorized by:   Loreen Freud DO   Signed by:    Loreen Freud DO on 08/08/2009   Method used:   Print then Give to Patient   RxID:   [id number] FLUOXETINE HCL 20 MG CAPS (FLUOXETINE HCL) Take 1 by mouth two times a day.  #60 Each x 5   Entered and Authorized by:   Loreen Freud DO   Signed by:   Loreen Freud DO on 08/08/2009   Method used:   Print then Give to Patient   RxID:   [id number] VICODIN ES 7.5-750 MG  TABS (HYDROCODONE-ACETAMINOPHEN) 1 by mouth every 6 hours as needed  #90 x 0   Entered and Authorized by:   Loreen Freud DO   Signed by:   Loreen Freud DO on 08/08/2009   Method used:   Print then Give to Patient   RxID:   [id number] LANTUS SOLOSTAR 100 UNIT/ML SOLN (INSULIN GLARGINE) 75 u Subcutaneously two times a day  #1 x 5   Entered and Authorized by:   Loreen Freud DO   Signed by:   Loreen Freud DO on 08/08/2009   Method used:   Print then Give to Patient   RxID:   [id number] ACTOS 45 MG TABS (PIOGLITAZONE HCL) Take 1 by mouth daily.  #30 Each x 5   Entered and Authorized by:   Loreen Freud DO   Signed by:   Loreen Freud DO on 08/08/2009   Method used:   Print then Give to Patient   RxID:   [id number] LEVOTHROID 200 MCG TABS (LEVOTHYROXINE SODIUM) Take one tablet daily.  #30.0 Each x 5   Entered and Authorized by:   Loreen Freud DO   Signed by:   Loreen Freud DO on 08/08/2009   Method used:   Print then Give to Patient   RxID:   [id number] CRESTOR 20 MG TABS (ROSUVASTATIN CALCIUM) take one tablet daily  #30.0 Each x 5   Entered and Authorized by:   Loreen Freud DO   Signed by:   Loreen Freud DO on 08/08/2009  Method used:   Print then Give to Patient   RxID:   [id number]

## 2010-04-05 NOTE — Progress Notes (Signed)
Summary: Kari Baars PT IN ED NOT DOING WELL  Phone Note Call from Patient   Summary of Call: Call from [place] [facility]  states that Pt is currently in ED. Pt is in post arrest and is on a vent. It is suspected to septic and per nurse it is not likely that pt will survive visit...................Marland KitchenFelecia Deloach CMA  December 20, 2009 12:30 PM   Follow-up for Phone Call        nothing in echart today.   I tried to call Pt wife tonight.  I left a message. Follow-up by: Loreen Freud DO,  December 21, 2009 7:43 PM  Additional Follow-up for Phone Call Additional follow up Details #1::        Tried calling to check pt status.Marland KitchenMarland KitchenNo ans and No mssg left. Additional Follow-up by: Almeta Monas CMA Duncan Dull),  December 22, 2009 3:31 PM    Additional Follow-up for Phone Call Additional follow up Details #2::    spoke with pt wife--pt will come off vent tomorrow and is doing much better She thanked [place] for checking on them Follow-up by: Loreen Freud DO,  December 22, 2009 3:42 PM

## 2010-04-05 NOTE — Progress Notes (Signed)
Summary: appt  Phone Note Call from Patient   Caller: Spouse Summary of Call: Pts wife is calling to discuss his Nephrology appt. She states she has not heard anything about it. Army Fossa CMA  August 22, 2009 3:37 PM   Follow-up for Phone Call        Spoke with [facility] yesterday about this patient and they were sch his appt today or tomorrow and will be calling the family then. Will call wife and make her aware of this.  Follow-up by: Harold Barban,  August 23, 2009 9:19 AM

## 2010-04-05 NOTE — Progress Notes (Signed)
Summary: Orders  Phone Note From Other Clinic Call back at [phone]   Caller: [facility] Summary of Call: Spoke with pts home heatlh nurse, a culture was ordered they are waiting on results. Cherly would like to know if they can use Iodosorb gel on his would on his heel. Please advise Initial call taken by: Army Fossa CMA,  May 01, 2009 8:15 AM  Follow-up for Phone Call        yes Follow-up by: Loreen Freud DO,  May 01, 2009 12:13 PM  Additional Follow-up for Phone Call Additional follow up Details #1::        LMTCB. Additional Follow-up by: Army Fossa CMA,  May 01, 2009 3:02 PM    Additional Follow-up for Phone Call Additional follow up Details #2::    Pt is aware. Army Fossa CMA  May 01, 2009 3:57 PM

## 2010-04-05 NOTE — Letter (Signed)
Summary: CMN for Gel Pad/[facility]  CMN for Gel Pad/[facility]   Imported By: Lanelle Bal 08/09/2009 11:13:31  _____________________________________________________________________  External Attachment:    Type:   Image     Comment:   External Document

## 2010-04-05 NOTE — Progress Notes (Signed)
Summary: Refill Request Microbiologist)  Phone Note Refill Request Message from:  Pharmacy on [facility] on [address] Fax #: [phone]  Refills Requested: Medication #1:  [facility] SYR/NDL 31G 0.5ML Use as directed 4 times daily   Dosage confirmed as above?Dosage Confirmed   Brand Name Necessary? No   Supply Requested: 1 month   Last Refilled: 06/10/2008 Next Appointment Scheduled: none Initial call taken by: Harold Barban,  May 04, 2009 8:29 AM    Prescriptions: Rushie Chestnut SYR/NDL 31G 0.5ML Use as directed 4 times daily  #100 x 3   Entered by:   Army Fossa CMA   Authorized by:   Loreen Freud DO   Signed by:   Army Fossa CMA on 05/04/2009   Method used:   Faxed to ...       [facility] [id number] (retail)       [address] [place], [place]  [address]       Ph: [phone]       Fax: [phone]   RxID:   [id number]

## 2010-04-05 NOTE — Miscellaneous (Signed)
Summary: SN & HA Orders/[facility]  SN & HA Orders/[facility]   Imported By: Lanelle Bal 10/04/2009 12:37:57  _____________________________________________________________________  External Attachment:    Type:   Image     Comment:   External Document

## 2010-04-05 NOTE — Progress Notes (Signed)
Summary: FYI PT status  Phone Note Refill Request Message from:  Spouse on February 05, 2010 12:21 PM  Refills Requested: Medication #1:  COUMADIN 5 MG TABS 1 by mouth once daily.  Medication #2:  LOPRESSOR HCT 50-25 MG TABS 1 by mouth two times a day I called the spouse back and she has called 911 to come get the patient. She thinks he has had a stroke. I will hold this message and call to check on him tomorrow.  Initial call taken by: Lucious Groves CMA,  February 05, 2010 12:22 PM  Follow-up for Phone Call        Pt wife called back left VM stating that it was not a stroke it was low BS of 30. Pt wife also states that pt still does need refills...........Marland KitchenFelecia Deloach CMA  February 05, 2010 1:40 PM   Called pt wife back advise her that Rx have been sent to pharmacy. Pt wife states that pt is now responsive and is feeling much better.........Marland KitchenFelecia Deloach CMA  February 05, 2010 2:39 PM   Additional Follow-up for Phone Call Additional follow up Details #1::        please check on pt tomorrow  Additional Follow-up by: Loreen Freud DO,  February 05, 2010 8:22 PM    Additional Follow-up for Phone Call Additional follow up Details #2::    Pt wife states that pt is fine today. Pt has not had any other episode since yesterday   Pt wife states that when med was added in hosp it was for lopressor 50-25 1/2 two times a day and when she pick Rx on yesterday it was for LOPRESSOR HCT 50-25 MG TABS 1 by mouth two times a day. Pls verify how pt is to take med.. Pt wife aware to continue with 1/2 two times a day and will call with changes once dr Laury Axon verify............Marland KitchenFelecia Deloach CMA  February 06, 2010 5:07 PM   Additional Follow-up for Phone Call Additional follow up Details #3:: Details for Additional Follow-up Action Taken: If he has been taking 1/2 two times a day then con't that and change on med list  yrlowne 02/06/2010 5pm   Pt wife aware to continue with 1/2 two times a day. med  list updated................Marland KitchenFelecia Deloach CMA  February 07, 2010 8:41 AM   New/Updated Medications: LOPRESSOR HCT 50-25 MG TABS (METOPROLOL-HYDROCHLOROTHIAZIDE) 1/2 two times a day Prescriptions: COUMADIN 5 MG TABS (WARFARIN SODIUM) 1 by mouth once daily  #30 x 0   Entered by:   Lucious Groves CMA   Authorized by:   Loreen Freud DO   Signed by:   Lucious Groves CMA on 02/05/2010   Method used:   Electronically to        [facility] [phone]* (retail)       [address] [place], [place]  [address]       Ph: [phone] or [phone]       Fax: [phone]   RxID:   [id number] LOPRESSOR HCT 50-25 MG TABS (METOPROLOL-HYDROCHLOROTHIAZIDE) 1 by mouth two times a day  #60 x 0   Entered by:   Lucious Groves CMA   Authorized by:   Loreen Freud DO   Signed by:   Lucious Groves CMA on 02/05/2010   Method used:   Electronically to        [facility] [phone]* (  retail)       [address] [place], [place]  [address]       Ph: [phone] or [phone]       Fax: [phone]   RxID:   [id number]

## 2010-04-05 NOTE — Letter (Signed)
Summary: [facility]  [facility]   Imported By: Lanelle Bal 12/05/2009 14:19:56  _____________________________________________________________________  External Attachment:    Type:   Image     Comment:   External Document

## 2010-04-05 NOTE — Progress Notes (Signed)
Summary: FYI from [facility]  Phone Note Other Incoming Call back at [facility] Phone [phone]   Caller: Patient Caller: Talbert Forest, Nurse w/ [facility] Summary of Call: She stated this was just a curtesy call about [name]. She said they have their own wound care nurse and she took a look at Mr. Haacke groin(medial thigh) and left heel wounds and said they looked a little dry and could possibly use hydrogel. 3 days a week instead of 2 days a week.   Can call Amy @ [phone] if you want to change the order.  Initial call taken by: Harold Barban,  August 24, 2009 2:07 PM  Follow-up for Phone Call        Called nurse back to verify information and she also wanted to inform us that she was changing his wound bandage on his groin wound and he had a little superficial skin tear that she placed a band-aid on it.  Follow-up by: Harold Barban,  August 24, 2009 2:10 PM  Additional Follow-up for Phone Call Additional follow up Details #1::        ok to change to 3 days a week Additional Follow-up by: Loreen Freud DO,  August 24, 2009 2:17 PM    Additional Follow-up for Phone Call Additional follow up Details #2::    left message for Amy that it was fine for them to change bandage three times a week. Army Fossa CMA  August 24, 2009 2:23 PM

## 2010-04-05 NOTE — Assessment & Plan Note (Signed)
Summary: ER FOLLOW UP//PH   Vital Signs:  Patient profile:   70 year old male Height:      70 inches Weight:      360 pounds BMI:     51.84 O2 Sat:      86 % on Room air Temp:     97.1 degrees F oral Pulse rate:   82 / minute Pulse rhythm:   regular BP sitting:   108 / 60  (left arm) Cuff size:   large  Vitals Entered By: Almeta Monas CMA Duncan Dull) (January 11, 2010 1:37 PM)  O2 Flow:  Room air CC: Hospt F/U--- Last eye exam 2 years ago, flu shot already recieved Is Patient Diabetic? Yes   History of Present Illness: Pt here to f/u ER---home health called stating his hgb was 7.? and K was high.   Pt went to ER and K was normal and hgb 9.?----pt was sent home.    Prior to that the pt was s/p cardiac arrest,  recurrent c diff, anemia, chf , renal failure.   Pt was d/c home 12 days later.  Pt has cardiology appointment next week.   Home health is coming to house to do wound care---pt to see Dr Lajoyce Corners in 2 weeks.  Home health is also doing PT check.     Preventive Screening-Counseling & Management  Alcohol-Tobacco     Alcohol drinks/day: 0     Smoking Status: never  Caffeine-Diet-Exercise     Caffeine use/day: 1     Does Patient Exercise: no  Hep-HIV-STD-Contraception     Dental Visit-last 6 months no  Safety-Violence-Falls     Seat Belt Use: yes     Firearms in the Home: no firearms in the home     Smoke Detectors: no     Violence in the Home: no risk noted     Fall Risk: in wheelchair      Sexual History:  currently monogamous.    Current Medications (verified): 1)  Crestor 20 Mg Tabs (Rosuvastatin Calcium) .... Take One Tablet Daily 2)  Levothroid 200 Mcg Tabs (Levothyroxine Sodium) .... Take One Tablet Daily. 3)  Lantus Solostar 100 Unit/ml Soln (Insulin Glargine) .... 75 U Subcutaneously Two Times A Day 4)  Vicodin Es 7.5-750 Mg  Tabs (Hydrocodone-Acetaminophen) .Marland Kitchen.. 1 By Mouth Every 6 Hours As Needed 5)  Fluoxetine Hcl 20 Mg Caps (Fluoxetine Hcl) .... Take 1 By  Mouth Two Times A Day. 6)  Humalog Kwikpen 100 Unit/ml Soln (Insulin Lispro (Human)) .... 25 Subcutaneously Three Times A Day With Meals 7)  Colchicine 0.6 Mg  Tabs (Colchicine) .... Take Two Tablets Daily. 8)  Baby Aspirin 81 Mg  Chew (Aspirin) .... Take One Tablet Daily. 9)  Furosemide 20 Mg  Tabs (Furosemide) .... Take One Tablet Daily 10)  [facility] Syr/ndl 31g 0.74ml 8mm .... Use As Directed 4 Times Daily 11)  Ascensia Contour Test   Strp (Glucose Blood) .... Use As Directed 12)  Gabapentin 300 Mg Caps (Gabapentin) .... Take One Capsule Every 8 Hours As Needed 13)  Allopurinol 100 Mg Tabs (Allopurinol) .Marland Kitchen.. 1 By Mouth Once Daily 14)  Fenofibrate 160 Mg Tabs (Fenofibrate) .... Take  1 By Mouth Once Daily 15)  Bd Pen Needle Mini U/f 31g X 5 Mm Misc (Insulin Pen Needle) .... Pt Injects 5 Times A Day 16)  Tamsulosin Hcl 0.4 Mg Caps (Tamsulosin Hcl) .... Take 1 Capsule By Mouth Daily. 17)  Ropinirole Hcl 2 Mg Tabs (Ropinirole Hcl) .Marland KitchenMarland KitchenMarland Kitchen  1 By Mouth Daily. 18)  Diltiazem Hcl Er Beads 240 Mg Xr24h-Cap (Diltiazem Hcl Er Beads) .Marland Kitchen.. 1 By Mouth Daily 19)  Poly-Iron 150 150 Mg Caps (Polysaccharide Iron Complex) .... Take As Directed 20)  Bayer Contour Test  Strp (Glucose Blood) .... As Directed 21)  Klor-Con 10 10 Meq Cr-Tabs (Potassium Chloride) .Marland Kitchen.. 1 By Mouth Daily. 22)  Clotrimazole-Betamethasone 1-0.05 % Crea (Clotrimazole-Betamethasone) .... Apply Two Times A Day For 2 Weeks. 23)  B12 Injections .Marland Kitchen.. 1 Injection Im Monthly Dx 266.8 24)  Cyanocobalamin 1000 Mcg/ml Soln (Cyanocobalamin) .Marland Kitchen.. 1 Ml Weekly X4 Then 1ml Im Monthly 25)  Wound Check and Wound Care .... As Needed For Wound On Foot 26)  Lopressor Hct 50-25 Mg Tabs (Metoprolol-Hydrochlorothiazide) .Marland Kitchen.. 1 By Mouth Two Times A Day 27)  Metronidazole 500 Mg Tabs (Metronidazole) .Marland Kitchen.. 1 By Mouth Three Times A Day 28)  Protonix 40 Mg Tbec (Pantoprazole Sodium) .Marland Kitchen.. 1 By Mouth Once Daily 29)  Florastor 250 Mg Caps (Saccharomyces Boulardii) .Marland Kitchen.. 1  By Mouth Two Times A Day 30)  Vancomycin Hcl 1000 Mg Solr (Vancomycin Hcl) .... Iv Q24h 31)  Coumadin 5 Mg Tabs (Warfarin Sodium) .Marland Kitchen.. 1 By Mouth Once Daily  Allergies (verified): No Known Drug Allergies  Past History:  Past Medical History: Last updated: 06/18/2007 Depression Diabetes mellitus, type II Gout Hyperlipidemia Hypertension Osteoarthritis  Past Surgical History: Last updated: 01/05/2009 Below knee amputation Rotator cuff repair Orchiectomy arthroscopic L knee  Social History: Last updated: 01/05/2009 Married Never Smoked Alcohol use-no Drug use-no  Risk Factors: Alcohol Use: 0 (01/11/2010) Caffeine Use: 1 (01/11/2010) Exercise: no (01/11/2010)  Risk Factors: Smoking Status: never (01/11/2010)  Family History: Reviewed history and no changes required.  Social History: Reviewed history from 01/05/2009 and no changes required. Married Never Smoked Alcohol use-no Drug use-no Does Patient Exercise:  no Dental Care w/in 6 mos.:  no Seat Belt Use:  yes Fall Risk:  in wheelchair  Review of Systems      See HPI  Physical Exam  General:  alert, overweight-appearing, and disheveled.   Ears:  External ear exam shows no significant lesions or deformities.  Otoscopic examination reveals clear canals, tympanic membranes are intact bilaterally without bulging, retraction, inflammation or discharge. Hearing is grossly normal bilaterally. Neck:  No deformities, masses, or tenderness noted. Lungs:  Normal respiratory effort, chest expands symmetrically. Lungs are clear to auscultation, no crackles or wheezes. Heart:  normal rate and no murmur.   Msk:  R AKA L ankle ulcer--no signs infections---healing well Neurologic:  alert & oriented X3.   Skin:  see above Cervical Nodes:  No lymphadenopathy noted Psych:  Oriented X3 and normally interactive.    Diabetes Management Exam:    Foot Exam (with socks and/or shoes not present):        Sensory-Pinprick/Light touch:          Left medial foot (L-4): diminished          Left dorsal foot (L-5): diminished          Left lateral foot (S-1): diminished          Right medial foot (L-4): absent          Right dorsal foot (L-5): absent          Right lateral foot (S-1): absent       Sensory-Monofilament:          Left foot: diminished          Right foot: absent  Inspection:          Left foot: abnormal             Comments: ulcer back L ankle---P drainage  healing well per home health and pt wife          Right foot: abnormal             Comments: R AKA   Impression & Recommendations:  Problem # 1:  CLOSTRIDIUM DIFFICILE COLITIS, HX OF (ICD-V12.79)  Orders: Venipuncture ([address]109) TLB-Lipid Panel (80061-LIPID) TLB-BMP (Basic Metabolic Panel-BMET) (80048-METABOL) TLB-CBC Platelet - w/Differential (85025-CBCD) TLB-Hepatic/Liver Function Pnl (80076-HEPATIC) TLB-TSH (Thyroid Stimulating Hormone) (84443-TSH) TLB-A1C / Hgb A1C (Glycohemoglobin) (83036-A1C) Gastroenterology Referral (GI) Specimen Handling ([address])  Problem # 2:  UNSPECIFIED ANEMIA (ICD-285.9)  His updated medication list for this problem includes:    Poly-iron 150 150 Mg Caps (Polysaccharide iron complex) .Marland Kitchen... Take as directed    Cyanocobalamin 1000 Mcg/ml Soln (Cyanocobalamin) .Marland Kitchen... 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 Take as directed    Cyanocobalamin 1000 Mcg/ml Soln (Cyanocobalamin) .Marland Kitchen... 1 ml weekly x4 then  1ml im monthly  Orders: Venipuncture (09811) TLB-Lipid Panel (80061-LIPID) TLB-BMP (Basic Metabolic Panel-BMET) (80048-METABOL) TLB-CBC Platelet - w/Differential (85025-CBCD) TLB-Hepatic/Liver Function Pnl (80076-HEPATIC) TLB-TSH (Thyroid Stimulating Hormone) (84443-TSH) TLB-A1C / Hgb A1C (Glycohemoglobin) (83036-A1C)  Hgb: 11.0 (10/16/2009)   Hct: 33.1 (10/16/2009)   Platelets: 175.0 (10/16/2009) RBC: 3.51 (10/16/2009)   RDW: 16.3 (10/16/2009)   WBC: 6.3 (10/16/2009) MCV: 94.3 (10/16/2009)   MCHC: 33.2 (10/16/2009) Iron: 62 (08/08/2009)   % Sat: 16.9 (08/08/2009) B12: 403 (10/16/2009)   Folate: 6.0 (08/08/2009)   TSH: 2.66 (08/08/2009)  Problem # 4:  HYPOTHYROIDISM NOS (ICD-244.9)  His updated medication list for this problem includes:    Levothroid 200 Mcg Tabs (Levothyroxine sodium) .Marland Kitchen... Take one tablet daily.  Orders: Venipuncture ([id number]) TLB-Lipid Panel (80061-LIPID) TLB-BMP (Basic Metabolic Panel-BMET) (80048-METABOL) TLB-CBC Platelet - w/Differential (85025-CBCD) TLB-Hepatic/Liver Function Pnl (80076-HEPATIC) TLB-TSH (Thyroid Stimulating Hormone) (84443-TSH) TLB-A1C / Hgb A1C (Glycohemoglobin) (83036-A1C) TLB-PSA (Prostate Specific Antigen) (84153-PSA)  Labs Reviewed: TSH: 2.66 (08/08/2009)    HgBA1c: 5.7 (08/08/2009) Chol: 125 (08/08/2009)   HDL: 41.90 (08/08/2009)   LDL: 70 (08/08/2009)   TG: 68.0 (08/08/2009)  Problem # 5:  RENAL INSUFFICIENCY (ICD-588.9)  Orders: Venipuncture (29562) TLB-Lipid Panel (80061-LIPID) TLB-BMP (Basic Metabolic Panel-BMET) (80048-METABOL) TLB-CBC Platelet - w/Differential (85025-CBCD) TLB-Hepatic/Liver Function Pnl (80076-HEPATIC) TLB-TSH (Thyroid Stimulating Hormone) (84443-TSH) TLB-A1C / Hgb A1C (Glycohemoglobin) (83036-A1C) TLB-PSA (Prostate Specific Antigen) (84153-PSA)  Problem # 6:  HYPERTENSION (ICD-401.9)  His updated medication list for this problem includes:    Furosemide 20 Mg Tabs (Furosemide) .Marland Kitchen... Take one tablet  daily    Diltiazem Hcl Er Beads 240 Mg Xr24h-cap (Diltiazem hcl er beads) .Marland Kitchen... 1 by mouth daily    Lopressor Hct 50-25 Mg Tabs (Metoprolol-hydrochlorothiazide) .Marland Kitchen... 1 by mouth two times a day  Orders: Venipuncture (13086) TLB-Lipid Panel (80061-LIPID) TLB-BMP (Basic Metabolic Panel-BMET) (80048-METABOL) TLB-CBC Platelet - w/Differential (85025-CBCD) TLB-Hepatic/Liver Function Pnl (80076-HEPATIC) TLB-TSH (Thyroid Stimulating Hormone) (  84443-TSH) TLB-A1C / Hgb A1C (Glycohemoglobin) (83036-A1C) TLB-PSA (Prostate Specific Antigen) (84153-PSA)  BP today: 108/60 Prior BP: 132/76 (08/08/2009)  Prior 10 Yr Risk Heart Disease: 22 % (04/03/2007)  Labs Reviewed: K+: 4.9 (08/08/2009) Creat: : 1.7 (08/08/2009)   Chol: 125 (08/08/2009)   HDL: 41.90 (08/08/2009)   LDL: 70 (08/08/2009)   TG: 68.0 (08/08/2009)  Problem # 7:  HYPERLIPIDEMIA (ICD-272.4)  His updated medication list for this problem includes:    Crestor 20 Mg Tabs (Rosuvastatin calcium) .Marland Kitchen... Take one tablet daily    Fenofibrate 160 Mg Tabs (Fenofibrate) .Marland Kitchen... Take  1 by mouth once daily  Orders: Venipuncture (16109) TLB-Lipid Panel (80061-LIPID) TLB-BMP (Basic Metabolic Panel-BMET) (80048-METABOL) TLB-CBC Platelet - w/Differential (85025-CBCD) TLB-Hepatic/Liver Function Pnl (80076-HEPATIC) TLB-TSH (Thyroid Stimulating Hormone) (84443-TSH) TLB-A1C / Hgb A1C (Glycohemoglobin) (83036-A1C) TLB-PSA (Prostate Specific Antigen) (84153-PSA)  Labs Reviewed: SGOT: 12 (08/08/2009)   SGPT: 9 (08/08/2009)  Prior 10 Yr Risk Heart Disease: 22 % (04/03/2007)   HDL:41.90 (08/08/2009), 37.00 (08/19/2008)  LDL:70 (08/08/2009), 88 (08/19/2008)  Chol:125 (08/08/2009), 156 (08/19/2008)  Trig:68.0 (08/08/2009), 157.0 (08/19/2008)  Problem # 8:  DIABETES MELLITUS, TYPE II (ICD-250.00)  The following medications were removed from the medication list:    Actos 45 Mg Tabs (Pioglitazone hcl) .Marland Kitchen... Take 1 by mouth daily. His updated medication  list for this problem includes:    Lantus Solostar 100 Unit/ml Soln (Insulin glargine) .Marland KitchenMarland KitchenMarland KitchenMarland Kitchen 75 u subcutaneously two times a day    Humalog Kwikpen 100 Unit/ml Soln (Insulin lispro (human)) .Marland Kitchen... 25 subcutaneously three times a day with meals    Baby Aspirin 81 Mg Chew (Aspirin) .Marland Kitchen... Take one tablet daily.  Labs Reviewed: Creat: 1.7 (08/08/2009)    Reviewed HgBA1c results: 5.7 (08/08/2009)  6.2 (08/19/2008)  Problem # 9:  DEPRESSION (ICD-311)  His updated medication list for this problem includes:    Fluoxetine Hcl 20 Mg Caps (Fluoxetine hcl) .Marland Kitchen... Take 1 by mouth two times a day.  Complete Medication List: 1)  Crestor 20 Mg Tabs (Rosuvastatin calcium) .... Take one tablet daily 2)  Levothroid 200 Mcg Tabs (Levothyroxine sodium) .... Take one tablet daily. 3)  Lantus Solostar 100 Unit/ml Soln (Insulin glargine) .... 75 u subcutaneously two times a day 4)  Vicodin Es 7.5-750 Mg Tabs (Hydrocodone-acetaminophen) .Marland Kitchen.. 1 by mouth every 6 hours as needed 5)  Fluoxetine Hcl 20 Mg Caps (Fluoxetine hcl) .... Take 1 by mouth two times a day. 6)  Humalog Kwikpen 100 Unit/ml Soln (Insulin lispro (human)) .... 25 subcutaneously three times a day with meals 7)  Colchicine 0.6 Mg Tabs (Colchicine) .... Take two tablets daily. 8)  Baby Aspirin 81 Mg Chew (Aspirin) .... Take one tablet daily. 9)  Furosemide 20 Mg Tabs (Furosemide) .... Take one tablet daily 10)  [facility] Syr/ndl 31g 0.35ml 8mm  .... Use as directed 4 times daily 11)  Ascensia Contour Test Strp (Glucose blood) .... Use as directed 12)  Gabapentin 300 Mg Caps (Gabapentin) .... Take one capsule every 8 hours as needed 13)  Allopurinol 100 Mg Tabs (Allopurinol) .Marland Kitchen.. 1 by mouth once daily 14)  Fenofibrate 160 Mg Tabs (Fenofibrate) .... Take  1 by mouth once daily 15)  Bd Pen Needle Mini U/f 31g X 5 Mm Misc (Insulin pen needle) .... Pt injects 5 times a day 16)  Tamsulosin Hcl 0.4 Mg Caps (Tamsulosin hcl) .... Take 1 capsule by mouth  daily. 17)  Ropinirole Hcl 2 Mg Tabs (Ropinirole hcl) .Marland Kitchen.. 1 by mouth daily. 18)  Diltiazem Hcl Er  Beads 240 Mg Xr24h-cap (Diltiazem hcl er beads) .Marland Kitchen.. 1 by mouth daily 19)  Poly-iron 150 150 Mg Caps (Polysaccharide iron complex) .... Take as directed 20)  [facility]ayer Contour Test Strp (Glucose blood) .... As directed 21)  Klor-con 10 10 Meq Cr-tabs (Potassium chloride) .Marland Kitchen.. 1 by mouth daily. 22)  Clotrimazole-betamethasone 1-0.05 % Crea (Clotrimazole-betamethasone) .... Apply two times a day for 2 weeks. 23)  B12 Injections  .Marland Kitchen.. 1 injection im monthly dx 266.8 24)  Cyanocobalamin 1000 Mcg/ml Soln (Cyanocobalamin) .Marland Kitchen.. 1 ml weekly x4 then 1ml im monthly 25)  Wound Check and Wound Care  .... As needed for wound on foot 26)  Lopressor Hct 50-25 Mg Tabs (Metoprolol-hydrochlorothiazide) .Marland Kitchen.. 1 by mouth two times a day 27)  Metronidazole 500 Mg Tabs (Metronidazole) .Marland Kitchen.. 1 by mouth three times a day 28)  Protonix 40 Mg Tbec (Pantoprazole sodium) .Marland Kitchen.. 1 by mouth once daily 29)  Florastor 250 Mg Caps (Saccharomyces boulardii) .Marland Kitchen.. 1 by mouth two times a day 30)  Vancomycin Hcl 1000 Mg Solr (Vancomycin hcl) .... Iv q24h 31)  Coumadin 5 Mg Tabs (Warfarin sodium) .Marland Kitchen.. 1 by mouth once daily  Other Orders: Ophthalmology Referral (Ophthalmology) Prescriptions: VICODIN ES 7.5-750 MG  TABS (HYDROCODONE-ACETAMINOPHEN) 1 by mouth every 6 hours as needed  #90 x 0   Entered and Authorized by:   Loreen Freud DO   Signed by:   Loreen Freud DO on 01/11/2010   Method used:   Print then Give to Patient   RxID:   [id number]    Orders Added: 1)  Venipuncture [46962] 2)  TLB-Lipid Panel [80061-LIPID] 3)  TLB-BMP (Basic Metabolic Panel-BMET) [80048-METABOL] 4)  TLB-CBC Platelet - w/Differential [85025-CBCD] 5)  TLB-Hepatic/Liver Function Pnl [80076-HEPATIC] 6)  TLB-TSH (Thyroid Stimulating Hormone) [84443-TSH] 7)  TLB-A1C / Hgb A1C (Glycohemoglobin) [83036-A1C] 8)  TLB-PSA (Prostate Specific Antigen)  [95284-XLK] 9)  Gastroenterology Referral [GI] 10)  Specimen Handling [99000] 11)  Ophthalmology Referral [Ophthalmology]    TD Result:  given Pneumovax Result Date:  01/18/2009 Pneumovax Result:  given- in hosp

## 2010-04-05 NOTE — Miscellaneous (Signed)
Summary: SN Orders/[facility]  SN Orders/[facility]   Imported By: Lanelle Bal 05/10/2009 09:47:10  _____________________________________________________________________  External Attachment:    Type:   Image     Comment:   External Document

## 2010-04-05 NOTE — Letter (Signed)
Summary: [facility]  [facility]   Imported By: Lanelle Bal 02/13/2010 11:13:03  _____________________________________________________________________  External Attachment:    Type:   Image     Comment:   External Document

## 2010-04-05 NOTE — Miscellaneous (Signed)
Summary: Oximetry Order/[facility]  Oximetry Order/[facility]   Imported By: Lanelle Bal 04/22/2009 10:59:33  _____________________________________________________________________  External Attachment:    Type:   Image     Comment:   External Document

## 2010-04-05 NOTE — Progress Notes (Signed)
Summary: REFILL  Phone Note Refill Request Message from:  Pharmacy on [facility] [address]  Refills Requested: Medication #1:  CRESTOR 20 MG TABS take one tablet daily  Medication #2:  LEVOTHROID 200 MCG TABS Take one tablet daily.  Medication #3:  GABAPENTIN 300 MG CAPS Take one capsule every 8 hours as needed  Medication #4:  ALLOPURINOL 100 MG TABS 1 by mouth once daily LAST OV 08-08-09,  LAST FILLED GABAPENTIN 08-08-09 #90 5, LAST OV #30 1   Follow-up for Phone Call        refill x1  5 refills Follow-up by: Loreen Freud DO,  October 18, 2009 2:47 PM    Prescriptions: ALLOPURINOL 100 MG TABS (ALLOPURINOL) 1 by mouth once daily  #30 Each x 5   Entered by:   Jeremy Johann CMA   Authorized by:   Loreen Freud DO   Signed by:   Jeremy Johann CMA on 10/18/2009   Method used:   Faxed to ...       [facility] [address] #[id number]* (retail)       [address] [place], [place]  [address]       Ph: [phone]       Fax: [phone]   RxID:   [id number] GABAPENTIN 300 MG CAPS (GABAPENTIN) Take one capsule every 8 hours as needed  #90.0 Each x 5   Entered by:   Jeremy Johann CMA   Authorized by:   Loreen Freud DO   Signed by:   Jeremy Johann CMA on 10/18/2009   Method used:   Faxed to ...       [facility] [address] #[id number]* (retail)       [address] [place], [place]  [address]       Ph: [phone]       Fax: [phone]   RxID:   [id number] LEVOTHROID 200 MCG TABS (LEVOTHYROXINE SODIUM) Take one tablet daily.  #30 Each x 1   Entered by:   Jeremy Johann CMA   Authorized by:   Loreen Freud DO   Signed by:   Jeremy Johann CMA on 10/18/2009   Method used:   Faxed to ...       [facility] [address] #[id number]* (retail)       [address] [place], [place]  [address]       Ph: [phone]       Fax: [phone]   RxID:   [id number] CRESTOR 20 MG TABS (ROSUVASTATIN CALCIUM) take one tablet daily  #30 Each x 1   Entered by:   Jeremy Johann CMA   Authorized by:   Loreen Freud DO   Signed by:   Jeremy Johann CMA on 10/18/2009   Method used:   Faxed to ...       [facility] [address] #[id number]* (retail)       [address] [place], [place]  [address]       Ph: [phone]       Fax: [phone]   RxID:   [id number]

## 2010-04-05 NOTE — Progress Notes (Signed)
Summary: clarification b12    Phone Note From Other Clinic   Caller: Elnita Maxwell from [facility] [phone] Summary of Call: just wanted you to be aware patient LEVELS  on 06/30/09 WAS 922 which is a little high. Do you want to administer once a month because your orders are once a week  for 4 weeks then monthly. 4 th dose done on 06/30/09. Wants clarification because order got today says weekly for 4 weeks then monthly, would need new order if you wants monthly   fax # is [phone] Initial call taken by: Kandice Hams,  Jul 11, 2009 3:34 PM  Follow-up for Phone Call        if he already had the 4 weekly then he needs an order for monthly Follow-up by: Loreen Freud DO,  Jul 11, 2009 4:03 PM    New/Updated Medications: CYANOCOBALAMIN 1000 MCG/ML SOLN (CYANOCOBALAMIN) IM MONTHLY Prescriptions: CYANOCOBALAMIN 1000 MCG/ML SOLN (CYANOCOBALAMIN) IM MONTHLY  #1 x 0   Entered by:   Kandice Hams   Authorized by:   Loreen Freud DO   Signed by:   Kandice Hams on 07/11/2009   Method used:   Reprint   RxID:   [id number]

## 2010-04-05 NOTE — Progress Notes (Signed)
Summary: sleep study order  Phone Note From Other Clinic Call back at [phone] ext [phone]   Caller: Mardella Layman ([facility]) Summary of Call: Pt wife indicated that Pt is unable to stay over night for sleep study that was order in hosp.  Per [facility] they have found a company that is willing to come out to house to perform study they just need order from physician along with a copy of current med list and Pt history. Pls fax [phone] attn: Talmadge Cov[employer]. Pls advise................Marland KitchenFelecia Deloach CMA  March 01, 2010 4:16 PM   Follow-up for Phone Call        pt will need pulmonary referral to have cpap adjusted if needed.  --- they will order appropriate test Follow-up by: Loreen Freud DO,  March 02, 2010 9:10 AM  Additional Follow-up for Phone Call Additional follow up Details #1::        Left detail message with Mardella Layman that Pt is being referred to pulmonary and appropriate test will be order and to return call with any further concerns...........Marland KitchenFelecia Deloach CMA  March 02, 2010 3:04 PM

## 2010-04-05 NOTE — Progress Notes (Signed)
Summary: Appt   Phone Note Other Incoming   Action Taken: Appt scheduled Summary of Call: Pt needs Fasting 30 min OV Scheduled before next refill request. Army Fossa CMA  Jul 18, 2009 8:53 AM   Follow-up for Phone Call        [email or web address].Harold Barban  Jul 18, 2009 9:59 AM  Additional Follow-up for Phone Call Additional follow up Details #1::        Patient is coming in 6.7.11 Additional Follow-up by: Harold Barban,  Jul 20, 2009 11:21 AM

## 2010-04-05 NOTE — Progress Notes (Signed)
Summary: RETURN Call to Verify PT meds   Phone Note Outgoing Call Call back at [phone] [facility]   Call placed by: Almeta Monas CMA Duncan Dull),  January 05, 2010 9:42 AM Call placed to: Patient Details for Reason: Called to [facility] from [facility] Summary of Call: Left message to call back to see what patient is taking for PT and INR.... Almeta Monas CMA Duncan Dull)  January 05, 2010 9:44 AM Called return Physical Therapy states that we will need to contact nurse at [facility] in regard to dosing she only handles Physical therapy Pls call [facility] at [phone]..............Marland KitchenFelecia Deloach CMA  January 05, 2010 9:51 AM   Follow-up for Phone Call        Spk with Shawna Orleans at [facility] and she stated Mr.Cowin is taking 5mg  Coumadin Daily updated 01/03/10, adv them to to drawn PT earlier in the day and call us with results, she voiced understanding. Advise I will let Dr.Lowne know what he is taking anf if any concerns I will call back with new orders. PT 20.2 and INR 1.71... Please advise if you want any changes to meds. Follow-up by: Almeta Monas CMA Duncan Dull),  January 05, 2010 12:02 PM  Additional Follow-up for Phone Call Additional follow up Details #1::        take 1 1/2 tabs of the 5mg   every Friday and 1 tab all the rest of the week----recheck 1 week Additional Follow-up by: Loreen Freud DO,  January 05, 2010 1:06 PM    Additional Follow-up for Phone Call Additional follow up Details #2::    I gave orders to [facility] for pt to take 7.5 mg on friday and 5mg  on all of the other days and to recheck in 1 week....she voiced understanding, tried contacting pt..... Left message to call back.... Almeta Monas CMA Duncan Dull)  January 05, 2010 2:48 PM    spk with Britta Mccreedy (wife) and advised her of the above and she voiced understanding, Call ended...Marland KitchenMarland KitchenMarland Kitchen Almeta Monas CMA Duncan Dull)  January 05, 2010 4:40 PM

## 2010-04-05 NOTE — Progress Notes (Signed)
Summary: b12  Phone Note Outgoing Call Call back at [facility] Phone [phone]   Call placed by: Army Fossa CMA,  May 15, 2009 4:18 PM Summary of Call: Pts wifes called and stated she would like an rx for his Vitamin b12 injections she states its been over 2 years since he has had them. I told her you may need to check his B12 level first, but she would like for me to ask you to see if you can just send in the Rx. Please advise. Army Fossa CMA  May 15, 2009 4:21 PM   Follow-up for Phone Call        have nurses draw B12 first then we can start them but we need the level drawn before injection is given Follow-up by: Loreen Freud DO,  May 15, 2009 4:56 PM  Additional Follow-up for Phone Call Additional follow up Details #1::        Pt is aware. Army Fossa CMA  May 15, 2009 4:59 PM

## 2010-04-05 NOTE — Progress Notes (Signed)
Summary: order for wound care  Phone Note From Other Clinic Call back at [phone]   Caller: [facility] Summary of Call: patient wife informed them that patient has wound on heal so they need order to go out and assess and do wound care if needed. Initial call taken by: Doristine Devoid CMA,  November 30, 2009 8:37 AM  Follow-up for Phone Call        ok to give verbal order Follow-up by: Loreen Freud DO,  November 30, 2009 9:26 AM  Additional Follow-up for Phone Call Additional follow up Details #1::        [facility]ed to [facility]....Marland KitchenMarland KitchenDoristine Devoid CMA  November 30, 2009 2:17 PM     New/Updated Medications: * WOUND CHECK AND WOUND CARE as needed for wound on foot Prescriptions: WOUND CHECK AND WOUND CARE as needed for wound on foot  #1 x 0   Entered by:   Doristine Devoid CMA   Authorized by:   Loreen Freud DO   Signed by:   Doristine Devoid CMA on 11/30/2009   Method used:   Print then Give to Patient   RxID:   [id number]

## 2010-04-05 NOTE — Medication Information (Signed)
Summary: Letter Regarding Actos & Edema & Duration Consideration for Fluo  Letter Regarding Actos & Edema & Duration Consideration for Fluoxetine/[facility]   Imported By: Lanelle Bal 01/05/2010 08:27:45  _____________________________________________________________________  External Attachment:    Type:   Image     Comment:   External Document

## 2010-04-05 NOTE — Letter (Signed)
Summary: Generic Letter  [facility]  [address] [place], [place] [address]   Phone: [phone]  Fax: [phone]    10/04/2009  RE: [name]  DOB 2041/02/02 [address] [place], [place]  [address]  To Whom It May Concern:  The above patient has a PMHX of Below the knee amputation R leg, Renal insufficiency, Osteoarthritis, hypertension, hyperlipidemia, diabetes and Gout.  With these medical conditions , [name] needs to be able to ride the [facility] bus so he can easily get to Doctors appointment , etc.  Please feel free to call with any further questions.              Sincerely,   Loreen Freud DO

## 2010-04-05 NOTE — Miscellaneous (Signed)
Summary: Care Plan/[facility]  Care Plan/[facility]   Imported By: Lanelle Bal 12/05/2009 12:55:26  _____________________________________________________________________  External Attachment:    Type:   Image     Comment:   External Document

## 2010-04-05 NOTE — Progress Notes (Signed)
Summary: orders  Phone Note Call from Patient   Summary of Call: pt wife request that [facility] be contact so pt can continue with b12 injection at home. spoke with [facility] all they need is  order and labs faxed to [phone] to restart care. pls advise.Marland KitchenMarland KitchenMarland KitchenFelecia Deloach CMA  October 31, 2009 4:41 PM   Follow-up for Phone Call        ok to con't monthly injections Follow-up by: Loreen Freud DO,  October 31, 2009 5:14 PM  Additional Follow-up for Phone Call Additional follow up Details #1::        left pt wife detail message order put in and faxed to [facility] and to call with any further concerns..........Marland KitchenFelecia Deloach CMA  November 01, 2009 4:39 PM

## 2010-04-05 NOTE — Progress Notes (Signed)
Summary: FYI FYI  update pt status  Phone Note Call from Patient   Caller: Amy ([facility]) Summary of Call: Pt went to hosp after some abnormal labs as advise by dr Laury Axon. Pharmacist want to report that at ED it was determine that  pt had a normal exam so no intervention was needed. Pt is now at home and is doing fine..................Marland KitchenFelecia Deloach CMA  January 10, 2010 10:01 AM   Follow-up for Phone Call        need labs from ER Follow-up by: Loreen Freud DO,  January 10, 2010 10:25 AM  Additional Follow-up for Phone Call Additional follow up Details #1::        Copy of labs placed on ledge for review.........Marland KitchenFelecia Deloach CMA  January 10, 2010 12:32 PM     Additional Follow-up for Phone Call Additional follow up Details #2::    Labs reviewed --- hgb better and K normal     Follow-up by: Loreen Freud DO,  January 10, 2010 1:52 PM

## 2010-04-05 NOTE — Progress Notes (Signed)
Summary: Actos refill---see NOTE  Phone Note Refill Request Message from:  Fax from Pharmacy on January 18, 2010 1:12 PM  Refills Requested: Medication #1:  actos 45 mg tabalets   Last Refilled: 12/19/2009   Notes: Take 1  tablet by mouth every day [facility], [address], [place]    ph=[phone]   fax [phone]  qty = 30   (NOTE in meds list states "removed 01/11/10 pt no longer takings meds)   Note on fax states "This patient would like multiple refills on this prescription please, thank you"  Next Appointment Scheduled: Tues 02/13/2010 Initial call taken by: Jerolyn Shin,  January 18, 2010 1:14 PM  Follow-up for Phone Call        called to pharmacy and advised pt is no longer taking med/ med removed due to possibilty of it causing heart problems..... Almeta Monas CMA Duncan Dull)  January 19, 2010 4:54 PM

## 2010-04-05 NOTE — Miscellaneous (Signed)
Summary: Care Plan/[facility]  Care Plan/[facility]   Imported By: Lanelle Bal 08/25/2009 14:14:59  _____________________________________________________________________  External Attachment:    Type:   Image     Comment:   External Document

## 2010-04-05 NOTE — Miscellaneous (Signed)
Summary: SN Orders/[facility]  SN Orders/[facility]   Imported By: Lanelle Bal 04/24/2009 12:47:47  _____________________________________________________________________  External Attachment:    Type:   Image     Comment:   External Document

## 2010-04-05 NOTE — Progress Notes (Signed)
Summary: Refill Request  Phone Note Refill Request Message from:  Pharmacy on [facility] Fax #: [phone]  Refills Requested: Medication #1:  TAMSULOSIN HCL 0.4 MG CAPS take 1 capsule by mouth daily.   Dosage confirmed as above?Dosage Confirmed   Supply Requested: 1 month   Last Refilled: 05/23/2009 Next Appointment Scheduled: none Initial call taken by: Harold Barban,  June 26, 2009 10:24 AM  Follow-up for Phone Call        Sent to wrong pharm on 06/21/09. Army Fossa CMA  June 26, 2009 10:30 AM     Prescriptions: TAMSULOSIN HCL 0.4 MG CAPS (TAMSULOSIN HCL) take 1 capsule by mouth daily.  #30.0 Each x 4   Entered by:   Army Fossa CMA   Authorized by:   Loreen Freud DO   Signed by:   Army Fossa CMA on 06/26/2009   Method used:   Electronically to        [id number]* (retail)       [address] [place], [place]  [address]       Ph: [phone]       Fax: [phone]   RxID:   [id number]

## 2010-04-05 NOTE — Progress Notes (Signed)
Summary: Refill  Phone Note Refill Request Message from:  Fax from Pharmacy on [facility] [address]   Refills Requested: Medication #1:  VICODIN ES 7.5-750 MG  TABS 1 by mouth every 6 hours as needed   Last Refilled: 04/14/2009 last ov- 03/07/08 Army Fossa CMA  May 17, 2009 11:02 AM    Follow-up for Phone Call        ok to refill x1 Follow-up by: Loreen Freud DO,  May 17, 2009 11:09 AM    Prescriptions: VICODIN ES 7.5-750 MG  TABS (HYDROCODONE-ACETAMINOPHEN) 1 by mouth every 6 hours as needed  #90 x 0   Entered by:   Army Fossa CMA   Authorized by:   Loreen Freud DO   Signed by:   Army Fossa CMA on 05/17/2009   Method used:   Printed then faxed to ...       [facility]  [address] [facility] [phone]* (retail)       [address] [place], [place]  [address]       Ph: [phone] or [phone]       Fax: [phone]   RxID:   [id number]

## 2010-04-05 NOTE — Miscellaneous (Signed)
Summary: Wound Care Order/[facility]  Wound Care Order/[facility]   Imported By: Lanelle Bal 03/21/2009 09:20:45  _____________________________________________________________________  External Attachment:    Type:   Image     Comment:   External Document

## 2010-04-05 NOTE — Progress Notes (Signed)
  Phone Note Call from Patient   Reason for Call: Lab or Test Results Summary of Call: Receieved urine culture from [facility], Per Dr Laury Axon start pt on Ampicillin 500 mg 1 by mouth q 6 hrs for 7 days and repeat UA C&S in 2 weeks. I called pt and left a voicemail, I informed home health nurse of this also and she will inform pt to pick up meds. Army Fossa CMA  May 03, 2009 2:35 PM     New/Updated Medications: AMPICILLIN 500 MG CAPS (AMPICILLIN) 1 by mouth every 6 hours for 7 days. Prescriptions: AMPICILLIN 500 MG CAPS (AMPICILLIN) 1 by mouth every 6 hours for 7 days.  #28 x 0   Entered by:   Army Fossa CMA   Authorized by:   Loreen Freud DO   Signed by:   Army Fossa CMA on 05/03/2009   Method used:   Electronically to        [facility] [phone]* (retail)       [address] [place], [place]  [address]       Ph: [phone] or [phone]       Fax: [phone]   RxID:   [id number]   Appended Document:  Pts wife is aware.

## 2010-04-05 NOTE — Assessment & Plan Note (Signed)
Summary: HX OF CLOSTRIDIUM & DIFFICILE COLITITS & ANEMIA/YF   History of Present Illness Visit Type: Initial Consult Primary GI MD: Sheryn Bison MD FACP FAGA Primary Provider: Loreen Freud, DO Requesting Provider: Loreen Freud, DO Chief Complaint: Post hospital: c_diff Clolitis & anemia History of Present Illness:   Extremely complex 70 year old Caucasian male on insulin for diabetes he was recently hospitalized with an acute myocardial infarction and urosepsis. He was treated with broad-spectrum antibiotics and developed C. difficile colitis. He currently is on tapering doses of vancomycin and denies and current GI problems. He is all over 20 medications. He apparently has chronic mild renal failure and peripheral neuropathy and peripheral vascular disease with amputation BKA the right leg. He denies upper GI or hepatobiliary complaints.  Review of his discharge summary hev was on Rocephin for approximately 2 months because of chronic osteomyelitis. His been followed by cardiology and was initially on metronidazole but is now on tapering doses of vancomycin. His wife relates that his diabetes is under good control, and he is followed by Dr.Lowne.   GI Review of Systems      Denies abdominal pain, acid reflux, belching, bloating, chest pain, dysphagia with liquids, dysphagia with solids, heartburn, loss of appetite, nausea, vomiting, vomiting blood, weight loss, and  weight gain.      Reports constipation, diarrhea, and  rectal pain.     Denies anal fissure, black tarry stools, change in bowel habit, diverticulosis, fecal incontinence, heme positive stool, hemorrhoids, irritable bowel syndrome, jaundice, light color stool, liver problems, and  rectal bleeding.    Current Medications (verified): 1)  Crestor 10 Mg Tabs (Rosuvastatin Calcium) .Marland Kitchen.. 1 By Mouth At Bedtime 2)  Levothroid 200 Mcg Tabs (Levothyroxine Sodium) .... Take One Tablet Daily. 3)  Lantus Solostar 100 Unit/ml Soln  (Insulin Glargine) .... 75 U Subcutaneously Two Times A Day 4)  Vicodin Es 7.5-750 Mg  Tabs (Hydrocodone-Acetaminophen) .Marland Kitchen.. 1 By Mouth Every 6 Hours As Needed 5)  Fluoxetine Hcl 20 Mg Caps (Fluoxetine Hcl) .... Take 1 By Mouth Two Times A Day. 6)  Humalog Kwikpen 100 Unit/ml Soln (Insulin Lispro (Human)) .... 25 Subcutaneously Three Times A Day With Meals 7)  Colchicine 0.6 Mg  Tabs (Colchicine) .... Take Two Tablets Daily. 8)  Baby Aspirin 81 Mg  Chew (Aspirin) .... Take One Tablet Daily. 9)  Furosemide 20 Mg  Tabs (Furosemide) .... Take One Tablet Daily 10)  [facility] Syr/ndl 31g 0.26ml 8mm .... Use As Directed 4 Times Daily 11)  Ascensia Contour Test   Strp (Glucose Blood) .... Use As Directed 12)  Gabapentin 300 Mg Caps (Gabapentin) .... Take One Capsule Every 8 Hours As Needed 13)  Allopurinol 100 Mg Tabs (Allopurinol) .Marland Kitchen.. 1 By Mouth Once Daily 14)  Fenofibrate 160 Mg Tabs (Fenofibrate) .... Take  1 By Mouth Once Daily 15)  Bd Pen Needle Mini U/f 31g X 5 Mm Misc (Insulin Pen Needle) .... Pt Injects 5 Times A Day 16)  Tamsulosin Hcl 0.4 Mg Caps (Tamsulosin Hcl) .... Take 1 Capsule By Mouth Daily. 17)  Ropinirole Hcl 2 Mg Tabs (Ropinirole Hcl) .Marland Kitchen.. 1 By Mouth Daily. 18)  Diltiazem Hcl Er Beads 240 Mg Xr24h-Cap (Diltiazem Hcl Er Beads) .Marland Kitchen.. 1 By Mouth Daily 19)  Poly-Iron 150 150 Mg Caps (Polysaccharide Iron Complex) .... Take As Directed 20)  Bayer Contour Test  Strp (Glucose Blood) .... As Directed 21)  Klor-Con 10 10 Meq Cr-Tabs (Potassium Chloride) .Marland Kitchen.. 1 By Mouth Daily. 22)  Clotrimazole-Betamethasone 1-0.05 %  Crea (Clotrimazole-Betamethasone) .... Apply Two Times A Day For 2 Weeks. 23)  B12 Injections .Marland Kitchen.. 1 Injection Im Monthly Dx 266.8 24)  Cyanocobalamin 1000 Mcg/ml Soln (Cyanocobalamin) .Marland Kitchen.. 1 Ml Weekly X4 Then 1ml Im Monthly 25)  Wound Check and Wound Care .... As Needed For Wound On Foot 26)  Lopressor Hct 50-25 Mg Tabs (Metoprolol-Hydrochlorothiazide) .... 1/2 Two Times A  Day 27)  Metronidazole 500 Mg Tabs (Metronidazole) .Marland Kitchen.. 1 By Mouth Three Times A Day 28)  Protonix 40 Mg Tbec (Pantoprazole Sodium) .Marland Kitchen.. 1 By Mouth Once Daily 29)  Florastor 250 Mg Caps (Saccharomyces Boulardii) .Marland Kitchen.. 1 By Mouth Two Times A Day 30)  Vancomycin Hcl 1000 Mg Solr (Vancomycin Hcl) .... Iv Q24h 31)  Coumadin 5 Mg Tabs (Warfarin Sodium) .Marland Kitchen.. 1 By Mouth Once Daily  Allergies (verified): No Known Drug Allergies  Past History:  Past medical, surgical, family and social histories (including risk factors) reviewed for relevance to current acute and chronic problems.  Past Medical History: Depression Diabetes mellitus, type II Gout Hyperlipidemia Hypertension Osteoarthritis Alcoholism Atrial Fibrillation Chronic Kidney Disease Congestive Heart Failure Sleep Apnea  Past Surgical History: Below knee amputation Rotator cuff repair Orchiectomy arthroscopic L knee Hernia Surgery  Family History: Reviewed history and no changes required. Family History of Diabetes: Brother Family History of Heart Disease: Mother Family History of Kidney Disease:Mother  Social History: Reviewed history from 01/05/2009 and no changes required. Married Never Smoked Alcohol use-no Drug use-no Occupation:  Daily Caffeine Use  Review of Systems       The patient complains of arthritis/joint pain, fatigue, heart rhythm changes, and shortness of breath.  The patient denies allergy/sinus, anemia, anxiety-new, back pain, blood in urine, breast changes/lumps, change in vision, confusion, cough, coughing up blood, depression-new, fainting, fever, headaches-new, hearing problems, heart murmur, itching, menstrual pain, muscle pains/cramps, night sweats, nosebleeds, pregnancy symptoms, skin rash, sleeping problems, sore throat, swelling of feet/legs, swollen lymph glands, thirst - excessive , urination - excessive , urination changes/pain, urine leakage, vision changes, and voice change.    Vital  Signs:  Patient profile:   70 year old male Pulse rate:   100 / minute Pulse rhythm:   regular BP sitting:   120 / 78  (left arm) Cuff size:   large  Vitals Entered By: June McMurray CMA Duncan Dull) (February 13, 2010 1:44 PM)  Physical Exam  General:  Well developed, well nourished, no acute distress.poor hygiene.   Head:  Normocephalic and atraumatic. Eyes:  PERRLA, no icterus.exam deferred to patient's ophthalmologist.   Abdomen:  This Patient is non-ambulatory and is in a wheelchair. As best as possible, abdominal exam is unremarkable. Rectal exam was not possible. Psych:  Alert and cooperative. Normal mood and affect.   Impression & Recommendations:  Problem # 1:  CLOSTRIDIUM DIFFICILE COLITIS, HX OF (ICD-V12.79) Assessment Improved Repeat stool C. difficile toxin assay by PCR. Depending on result of this assay we can give further recommendations as to therapy. I cannot see where he is on antibiotics at this time. He does take Colestid day which have asked him to continue along with Florstar probiotics. He is not a candidate for colonoscopy at this time. Orders: T-C diff by PCR (24401)  Problem # 2:  RENAL INSUFFICIENCY (ICD-588.9) Assessment: Comment Only The Patient is not on dialysis but has chronic renal failure from his hypertension and diabetes. This is managed expertly by Dr. Laury Axon.  Patient Instructions: 1)  Copy sent to : Loreen Freud, DO 2)  Please  continue current medications.  3)  Please go to the basement today for your labs.  4)  The medication list was reviewed and reconciled.  All changed / newly prescribed medications were explained.  A complete medication list was provided to the patient / caregiver.

## 2010-04-05 NOTE — Progress Notes (Signed)
  Phone Note From Other Clinic   Caller: [facility] Summary of Call: Need Vitamin b12 injections sent into the pharmacy per [facility] at [facility].     New/Updated Medications: CYANOCOBALAMIN 1000 MCG/ML SOLN (CYANOCOBALAMIN) 1 ml injected IM once weekly for 4 weeks. Prescriptions: CYANOCOBALAMIN 1000 MCG/ML SOLN (CYANOCOBALAMIN) 1 ml injected IM once weekly for 4 weeks.  #4 x 0   Entered by:   Army Fossa CMA   Authorized by:   Loreen Freud DO   Signed by:   Army Fossa CMA on 05/31/2009   Method used:   Electronically to        [facility] [phone]* (retail)       [address] [place], [place]  [address]       Ph: [phone] or [phone]       Fax: [phone]   RxID:   [id number]

## 2010-04-05 NOTE — Progress Notes (Signed)
Summary: Nurse Call  Phone Note Other Incoming   Caller: Marybelle Killings RN Details for Reason: Order for wound care Summary of Call: Mssg from [facility] with [facility] and she stated that the pat has some open wounds on the buttocks, bacl of the left thigh and l heel. His wife has been doing a wet to dry and using a barrier cream. wanted to know if Dr. Laury Axon would like to put in a order for wound care on this pt. C/B # [id number].         Almeta Monas CMA Duncan Dull)  November 02, 2009 3:56 P  Follow-up for Phone Call        wound care referral put in   Follow-up by: Loreen Freud DO,  November 02, 2009 3:59 PM  Additional Follow-up for Phone Call Additional follow up Details #1::        PER LISA FAX ORDER TO [phone] ATTN DIANE. Additional Follow-up by: Almeta Monas CMA Duncan Dull),  November 02, 2009 5:06 PM  New Problems: DECUBITUS ULCER, BUTTOCK (ICD-707.05)   Additional Follow-up for Phone Call Additional follow up Details #2::    orders faxed...............Marland KitchenFelecia Deloach CMA  November 03, 2009 9:30 AM   New Problems: DECUBITUS ULCER, BUTTOCK (ICD-707.05)

## 2010-04-05 NOTE — Progress Notes (Signed)
Summary: Results   Phone Note Outgoing Call   Call placed by: Army Fossa CMA,  August 14, 2009 4:23 PM Reason for Call: Discuss lab or test results Summary of Call: Left message for pt to call back:  L renal cyst  only---nephrology appointment pending Signed by Loreen Freud DO on 08/14/2009 at 1:57 PM  Follow-up for Phone Call        Pt states that he does not have a nephrology appt pending, referral? Army Fossa CMA  August 14, 2009 4:39 PM   Additional Follow-up for Phone Call Additional follow up Details #1::        He had one and never went--I was under impression wife was going to call them---but I'll but another referral in  Additional Follow-up by: Loreen Freud DO,  August 15, 2009 8:30 AM

## 2010-04-05 NOTE — Progress Notes (Signed)
Summary: REFILL  Phone Note Refill Request Message from:  Fax from Pharmacy on [facility] [phone]  Refills Requested: Medication #1:  KLOR-CON 10 10 MEQ CR-TABS 1 by mouth daily.. THIS PRESCRIPTION IS AVAILABLE IN A [facility] GENERIC ALTERNATIVE. WE ARE REQUESTING AUTHORIZATION TO REWRITE THIS PRESCRIPTION AS THE GENERIC.  Initial call taken by: Barb Merino,  March 15, 2009 11:50 AM  Follow-up for Phone Call        ok to refill Follow-up by: Loreen Freud DO,  March 15, 2009 1:18 PM  Additional Follow-up for Phone Call Additional follow up Details #1::        called pharmacy. Army Fossa CMA  March 15, 2009 1:23 PM

## 2010-04-05 NOTE — Letter (Signed)
Summary: New Patient letter  [facility]  [address] [name], [place] [address]   Phone: [phone]  Fax: [phone]       01/11/2010 MRN: [id number]  [name] [address] [place], [place]  [address]  Dear [name],  Welcome to the Gastroenterology Division at [facility].    You are scheduled to see Dr.  Jarold Motto on 02-13-10 at 2pm on the 3rd floor at [facility], [address]  We ask that you try to arrive at our office 15 minutes prior to your appointment time to allow for check-in.  We would like you to complete the enclosed self-administered evaluation form prior to your visit and bring it with you on the day of your appointment.  We will review it with you.  Also, please bring a complete list of all your medications or, if you prefer, bring the medication bottles and we will list them.  Please bring your insurance card so that we may make a copy of it.  If your insurance requires a referral to see a specialist, please bring your referral form from your primary care physician.  Co-payments are due at the time of your visit and may be paid by cash, check or credit card.     Your office visit will consist of a consult with your physician (includes a physical exam), any laboratory testing he/she may order, scheduling of any necessary diagnostic testing (e.g. x-ray, ultrasound, CT-scan), and scheduling of a procedure (e.g. Endoscopy, Colonoscopy) if required.  Please allow enough time on your schedule to allow for any/all of these possibilities.    If you cannot keep your appointment, please call [phone] to cancel or reschedule prior to your appointment date.  This allows [place] the opportunity to schedule an appointment for another patient in need of care.  If you do not cancel or reschedule by 5 p.m. the business day prior to your appointment date, you will be charged a $50.00 late cancellation/no-show fee.    Thank you for choosing  [facility] for your medical needs.  We appreciate the opportunity to care for you.  Please visit [place] at our website  to learn more about our practice.                     Sincerely,                                                             The Gastroenterology Division

## 2010-04-05 NOTE — Miscellaneous (Signed)
Summary: Coumadin Order/[facility]  Coumadin Order/[facility]   Imported By: Lanelle Bal 01/18/2010 11:58:57  _____________________________________________________________________  External Attachment:    Type:   Image     Comment:   External Document

## 2010-04-05 NOTE — Miscellaneous (Signed)
Summary: B12 Order/[facility]  B12 Order/[facility]   Imported By: Lanelle Bal 08/09/2009 11:16:34  _____________________________________________________________________  External Attachment:    Type:   Image     Comment:   External Document

## 2010-04-05 NOTE — Progress Notes (Signed)
Summary: -start therapy  Phone Note From Other Clinic Call back at [phone]   Caller: Patient Caller: Rockie Neighbours Summary of Call: Nurse left VM wanted to know if it is ok for pt to participate in weight bearing exercise for physical therapy or should he wait until antibiotics are finished to start with the therapy.. Pt still has wound on his heel that is healing..................Marland KitchenFelecia Deloach CMA  January 03, 2010 3:46 PM   Follow-up for Phone Call        wait ----does pt have appoint for f/u? Follow-up by: Loreen Freud DO,  January 04, 2010 8:56 AM  Additional Follow-up for Phone Call Additional follow up Details #1::        left message to call office............Marland KitchenFelecia Deloach CMA  January 04, 2010 3:43 PM  nurse return call advise her of dr Laury Axon suggestion and inform her that hosp f/u will be needed with dr Laury Axon so that we can be update on pt progress since d/c from hosp. Per nurse she will be speaking with pt wife after we get off the phone will advise them then that f/u appt is needed............Marland KitchenFelecia Deloach CMA  January 04, 2010 5:01 PM

## 2010-04-05 NOTE — Miscellaneous (Signed)
Summary: PT Orders/[facility]  PT Orders/[facility]   Imported By: Lanelle Bal 03/21/2009 15:10:17  _____________________________________________________________________  External Attachment:    Type:   Image     Comment:   External Document

## 2010-04-05 NOTE — Progress Notes (Signed)
Summary: continue therapy  Phone Note From Other Clinic Call back at [phone]   Caller: [facility] Summary of Call: Verbal Ok to continue therapy for 4 more week. plan as follow: 2x a week for 2 weeks then 1x a week for 2 weeks and f/u after that. pleas call to ok or decline......................Marland KitchenFelecia Deloach CMA  March 09, 2009 12:26 PM   Follow-up for Phone Call        yes Follow-up by: Loreen Freud DO,  March 09, 2009 12:30 PM  Additional Follow-up for Phone Call Additional follow up Details #1::        left detail message with verbal Ok to continue therapy ..................Marland KitchenFelecia Deloach CMA  March 09, 2009 2:29 PM

## 2010-04-05 NOTE — Miscellaneous (Signed)
Summary: SN Orders/[facility]  SN Orders/[facility]   Imported By: Lanelle Bal 04/27/2009 09:15:07  _____________________________________________________________________  External Attachment:    Type:   Image     Comment:   External Document

## 2010-04-05 NOTE — Progress Notes (Signed)
Summary: orders  Phone Note Call from Patient Call back at [phone]   [facility]er: [facility] Summary of Call: pt has pressure ulcer on left heal. The nurse would like the ok to apply iodoform two to three times a week. Pt is also requesting a bath aide three times a week. pls advise   Follow-up for Phone Call        ok to give verbal order Follow-up by: Loreen Freud DO,  December 12, 2009 4:48 PM  Additional Follow-up for Phone Call Additional follow up Details #1::        verbal order given to [facility] Additional Follow-up by: Almeta Monas CMA Duncan Dull),  December 12, 2009 4:52 PM

## 2010-04-05 NOTE — Progress Notes (Signed)
Summary: verbal order  Phone Note From Other Clinic   [facility] Summary of Call: Amy called and wanted a verbal okay to continuing going out to give him monthly b12 shots and to asses his needs. I gave verbal order. Army Fossa CMA  August 10, 2009 10:43 AM

## 2010-04-05 NOTE — Progress Notes (Signed)
Summary: Lab Order  Phone Note Other Incoming   Summary of Call: Can you put the order in for the SPEP, UPEP 588.9 so I can fax it to the hospital with the referral order? Thanks Initial call taken by: Harold Barban,  August 11, 2009 9:35 AM  Follow-up for Phone Call        done Follow-up by: Loreen Freud DO,  August 11, 2009 12:10 PM

## 2010-04-05 NOTE — Miscellaneous (Signed)
Summary: SN Orders/[facility]  SN Orders/[facility]   Imported By: Lanelle Bal 12/04/2009 13:06:05  _____________________________________________________________________  External Attachment:    Type:   Image     Comment:   External Document

## 2010-04-05 NOTE — Progress Notes (Signed)
Summary: discharging from home care  Phone Note From Other Clinic Call back at [phone]   Caller: Brooke-RN  Summary of Call: patient is going to be discharged from [facility] she will be pulling pic line today and will be showing wife how to do wound care wanted to know if you wanted anything different if so she will need to have orders.  Initial call taken by: Doristine Devoid CMA,  February 14, 2010 11:43 AM  Follow-up for Phone Call        pt saw wound clinic yesterday--- if they were ok with d/c pic line that's fine. Follow-up by: Loreen Freud DO,  February 14, 2010 12:22 PM  Additional Follow-up for Phone Call Additional follow up Details #1::        I spoke with Harriett Sine at the wound center and she advised that they don't do anything with the pic line, because they don't normally manage IV theraphy. Patient has not been seen at the wound center yet, he was scheduled for the 15th but he rescheduled to the 12/22 at 1pm. Would you like for me to put in an order for wound care? please advise Additional Follow-up by: Almeta Monas CMA Duncan Dull),  February 14, 2010 2:35 PM    Additional Follow-up for Phone Call Additional follow up Details #2::    If they showed wife how to take care of it that should be good enough until seen on 22nd.    Follow-up by: Loreen Freud DO,  February 15, 2010 9:26 AM  Additional Follow-up for Phone Call Additional follow up Details #3:: Details for Additional Follow-up Action Taken: ok. ... Almeta Monas CMA Duncan Dull)  February 15, 2010 9:42 AM

## 2010-04-05 NOTE — Progress Notes (Signed)
Summary: [facility] Letter  Phone Note Call from Patient Call back at Home Phone [phone]   Caller: Doylene Bode Summary of Call: Spouse left message on triage that the patient needs a statement that he is eligible to ride the [employer] bus to doctor appts and etc. Please advise.  Initial call taken by: Lucious Groves CMA,  October 04, 2009 11:23 AM  Follow-up for Phone Call        letter done Follow-up by: Loreen Freud DO,  October 04, 2009 12:24 PM  Additional Follow-up for Phone Call Additional follow up Details #1::        pt wife aware letter complete and letter mailed per pt wife..................Marland KitchenFelecia Deloach CMA  October 04, 2009 1:32 PM

## 2010-04-05 NOTE — Miscellaneous (Signed)
Summary: SN Orders/[facility]  SN Orders/[facility]   Imported By: Lanelle Bal 06/06/2009 09:59:31  _____________________________________________________________________  External Attachment:    Type:   Image     Comment:   External Document

## 2010-04-05 NOTE — Miscellaneous (Signed)
Summary: OT Orders/[facility]  OT Orders/[facility]   Imported By: Lanelle Bal 12/27/2009 10:18:42  _____________________________________________________________________  External Attachment:    Type:   Image     Comment:   External Document

## 2010-04-05 NOTE — Progress Notes (Signed)
Summary: Refill Request  Phone Note Refill Request Call back at [phone] Message from:  Pharmacy on February 06, 2010 8:24 AM  Refills Requested: Medication #1:  PROTONIX 40 MG TBEC 1 by mouth once daily   Dosage confirmed as above?Dosage Confirmed   Supply Requested: 1 month   Last Refilled: 01/01/2010 [facility] on [address]   Next Appointment Scheduled: none Initial call taken by: Harold Barban,  February 06, 2010 8:25 AM    Prescriptions: PROTONIX 40 MG TBEC (PANTOPRAZOLE SODIUM) 1 by mouth once daily  #30 x 2   Entered by:   Almeta Monas CMA (AAMA)   Authorized by:   Loreen Freud DO   Signed by:   Almeta Monas CMA (AAMA) on 02/06/2010   Method used:   Electronically to        [facility] [address] #[id number]* (retail)       [address] [place], [place]  [address]       Ph: [phone]       Fax: [phone]   RxID:   [id number]

## 2010-04-05 NOTE — Progress Notes (Signed)
Summary: Orders update/Wound clinic referral  Phone Note From Other Clinic   Caller: RN--Brooke---[facility] Call For: [phone] Summary of Call: Patient orders for picc line and dressing changes have ran out. He is seen once a week to monitor things, labs, dressing changes and IV ABX. How long would you like to extend the patient orders? Please advise. Initial call taken by: Lucious Groves CMA,  February 06, 2010 1:22 PM  Follow-up for Phone Call        Pt has appointment with GI 12/13 for c diff is wound healing?  he needs to be on iv abx for total 6 weeks---- con't until 12/12---  if wound not healed we will need to refer to wound clinic---  pt should have ov next week and we will need to re xray.    Follow-up by: Loreen Freud DO,  February 06, 2010 2:23 PM  Additional Follow-up for Phone Call Additional follow up Details #1::        RN notes that the wounds are healing. His toe is much better than the ankle and heel. She notes that they can only take the patient to the Dr. once per month and has been to wound clinic previously. I notified the RN of the above and she states that it would be best that I call the wife to schedule appt. Is it ok to use Hyrdrogel on this patient and should the patient be d/c and picc pulled after next week? Please advise. Additional Follow-up by: Lucious Groves CMA,  February 06, 2010 5:06 PM  New Problems: ACUTE OSTEOMYELITIS, ANKLE AND FOOT (ICD-730.07)   Additional Follow-up for Phone Call Additional follow up Details #2::    schedule wound clinic then if he can only go 1xa month--I can see him if we can not get a appointment within the next month Follow-up by: Loreen Freud DO,  February 06, 2010 5:26 PM  Additional Follow-up for Phone Call Additional follow up Details #3:: Details for Additional Follow-up Action Taken: Will notify [facility] Lucious Groves CMA  February 07, 2010 8:44 AM   PT APPT 02-13-2010 W/[facility] [place], PT IS AWARE [facility]dalen Spatz [facility]   February 07, 2010 1:56 PM   New Problems: ACUTE OSTEOMYELITIS, ANKLE AND FOOT (ICD-730.07)

## 2010-04-05 NOTE — Letter (Signed)
Summary: [facility]  [facility]   Imported By: Lanelle Bal 09/18/2009 12:32:48  _____________________________________________________________________  External Attachment:    Type:   Image     Comment:   External Document

## 2010-04-05 NOTE — Miscellaneous (Signed)
Summary: SN Orders/[facility]  SN Orders/[facility]   Imported By: Lanelle Bal 07/11/2009 12:53:36  _____________________________________________________________________  External Attachment:    Type:   Image     Comment:   External Document

## 2010-04-05 NOTE — Progress Notes (Signed)
Summary: Positive UTI-[facility] Call[facility]  Phone Note From Other Clinic Call back at [phone]   Caller: Elnita Maxwell, [facility] Summary of Call: Positive UTI as of the March 24,2011, will fax over report. Patient needs treatment.  Dr.Lowne please advise Initial call taken by: Shonna Chock,  May 29, 2009 12:30 PM  Follow-up for Phone Call        cipro 500 mg two times a day for 5 days----refer to urology for recurrent uti  b12 low normal---  do they think he may benefit from b12 injections? Follow-up by: Loreen Freud DO,  May 29, 2009 2:45 PM  Additional Follow-up for Phone Call Additional follow up Details #1::        I informed Pts home health nurse of the cipro, she also states about the b12 injections, it depends on the diagnosis if it will be covered. Army Fossa CMA  May 29, 2009 3:27 PM     Additional Follow-up for Phone Call Additional follow up Details #2::    B12 deficiency Follow-up by: Loreen Freud DO,  May 29, 2009 4:11 PM  Additional Follow-up for Phone Call Additional follow up Details #3:: Details for Additional Follow-up Action Taken: faxed order to [facility]- [facility]. [phone]. Army Fossa CMA  May 29, 2009 4:39 PM   New/Updated Medications: CIPRO 500 MG TABS (CIPROFLOXACIN HCL) 1 by mouth two times a day for 5 days. * B12 INJECTIONS 1 injection IM weekly for 4 weeks, then recheck level. Dx- 266.2 Prescriptions: B12 INJECTIONS 1 injection IM weekly for 4 weeks, then recheck level. Dx- 266.2  #1 x 0   Entered by:   Army Fossa CMA   Authorized by:   Loreen Freud DO   Signed by:   Army Fossa CMA on 05/29/2009   Method used:   Printed then faxed to ...       [facility]  [address] [phone]* (retail)       [address] [place], [place]  [address]       Ph: [phone] or [phone]       Fax: [phone]   RxID:   [id number] CIPRO 500 MG TABS (CIPROFLOXACIN HCL) 1 by mouth two times a day for 5 days.  #10 x  0   Entered by:   Army Fossa CMA   Authorized by:   Loreen Freud DO   Signed by:   Army Fossa CMA on 05/29/2009   Method used:   Electronically to        [facility] [phone]* (retail)       [address] [place], [place]  [address]       Ph: [phone] or [phone]       Fax: [phone]   RxID:   [id number]

## 2010-04-05 NOTE — Progress Notes (Signed)
Summary: refill  Phone Note Refill Request Message from:  Fax from Pharmacy on January 15, 2010 3:50 PM  Refills Requested: Medication #1:  CRESTOR 20 MG TABS take one tablet daily  Medication #2:  ACTOS 45MG  TABLET- TAKE 1 TABLET BY MOUTH EVERY DAY [address] - FAX [phone]  Initial call taken by: Okey Regal Spring,  January 15, 2010 3:52 PM  Follow-up for Phone Call        Pt no longer taking Actos Follow-up by: Almeta Monas CMA Duncan Dull),  January 15, 2010 4:56 PM    New/Updated Medications: CRESTOR 10 MG TABS (ROSUVASTATIN CALCIUM) 1 by mouth at bedtime Prescriptions: CRESTOR 10 MG TABS (ROSUVASTATIN CALCIUM) 1 by mouth at bedtime  #30 x 2   Entered by:   Almeta Monas CMA (AAMA)   Authorized by:   Loreen Freud DO   Signed by:   Almeta Monas CMA (AAMA) on 01/15/2010   Method used:   Electronically to        [facility] [id number]* (retail)       [address] [name], [place]  [address]       Ph: [phone]       Fax: [phone]   RxID:   [id number]

## 2010-04-05 NOTE — Miscellaneous (Signed)
Summary: Discharge Notice/[facility]  Discharge Notice/[facility]   Imported By: Lanelle Bal 11/01/2009 11:43:14  _____________________________________________________________________  External Attachment:    Type:   Image     Comment:   External Document

## 2010-04-05 NOTE — Miscellaneous (Signed)
Summary: OT Orders/[facility]  OT Orders/[facility]   Imported By: Lanelle Bal 02/02/2010 09:31:57  _____________________________________________________________________  External Attachment:    Type:   Image     Comment:   External Document

## 2010-04-05 NOTE — Progress Notes (Signed)
Summary: b12 order  Phone Note Call from Patient Call back at [phone]   Caller: michelle([facility]) Summary of Call: need order to administer B12 . Order needs to state dose,  and how often to be administer. Order to be faxed to [phone] attn:michelle. pls advise on orders............................Marland KitchenFelecia Deloach CMA  Jul 11, 2009 12:19 PM   Follow-up for Phone Call        B12 1000 mg / ml  I ml  weekly x4 then 1ml IM monthly recheck b12 in 1 month Follow-up by: Loreen Freud DO,  Jul 11, 2009 12:25 PM  Additional Follow-up for Phone Call Additional follow up Details #1::        awaiing signature to be faxed.Marti Sleigh Deloach CMA  Jul 11, 2009 12:40 PM   order faxed..............Marland KitchenFelecia Deloach CMA  Jul 11, 2009 1:14 PM     New/Updated Medications: CYANOCOBALAMIN 1000 MCG/ML SOLN (CYANOCOBALAMIN) 1 ml weekly x4 then 1ml IM monthly Prescriptions: CYANOCOBALAMIN 1000 MCG/ML SOLN (CYANOCOBALAMIN) 1 ml weekly x4 then 1ml IM monthly  #1 x 0   Entered by:   Jeremy Johann CMA   Authorized by:   Loreen Freud DO   Signed by:   Jeremy Johann CMA on 07/11/2009   Method used:   Printed then faxed to ...       [facility]  [address] [phone]* (retail)       [address] [place], [place]  [address]       Ph: [phone] or [phone]       Fax: [phone]   RxID:   [id number]

## 2010-04-05 NOTE — Progress Notes (Signed)
Summary: B12 level - FYI already taken care of.   Phone Note From Other Clinic   Caller: [facility] Summary of Call: [facility]- Vitamin b12- High 922. Army Fossa CMA  June 30, 2009 4:12 PM    Follow-up for Phone Call        Per Dr. Beverely Low level is okay. Army Fossa CMA  June 30, 2009 4:13 PM

## 2010-04-05 NOTE — Progress Notes (Signed)
Summary: B12 injections  Phone Note Other Incoming Call back at [facility] Phone [phone]   Caller: Patient Caller: [name] from [facility] Summary of Call: [name] called and left a VM on the triage line stating they have been seeing [name] for wound care and monthly b12 injections. He is scheduled for discharge from them on 7.22.11. If we would like to continue his monthly injections we need to call in a new order to her. Her number is [phone].  Initial call taken by: Harold Barban,  September 21, 2009 10:31 AM  Follow-up for Phone Call        printed rx Follow-up by: Loreen Freud DO,  September 21, 2009 10:36 AM  Additional Follow-up for Phone Call Additional follow up Details #1::        verbal order given to nurse at her request. nurse will write order for pt.Marland KitchenMarland KitchenFelecia Deloach CMA  September 21, 2009 12:10 PM     New/Updated Medications: * B12 INJECTIONS 1 injection IM monthly dx 266.8 Prescriptions: B12 INJECTIONS 1 injection IM monthly dx 266.8  #1 vial x 5   Entered and Authorized by:   Loreen Freud DO   Signed by:   Loreen Freud DO on 09/21/2009   Method used:   Print then Give to Patient   RxID:   [id number]

## 2010-04-05 NOTE — Progress Notes (Signed)
Summary: refill  Phone Note Refill Request Message from:  Pharmacy on January 15, 2010 1:33 PM  Refills Requested: Medication #1:  FUROSEMIDE 20 MG  TABS take one tablet daily  Medication #2:  FLUOXETINE HCL 20 MG CAPS Take 1 by mouth two times a day.  Medication #3:  KLOR-CON 10 10 MEQ CR-TABS 1 by mouth daily. [facility] - fax [phone]  Initial call taken by: Okey Regal Spring,  January 15, 2010 1:37 PM    Prescriptions: KLOR-CON 10 10 MEQ CR-TABS (POTASSIUM CHLORIDE) 1 by mouth daily.  #30 Each x 2   Entered by:   Almeta Monas CMA (AAMA)   Authorized by:   Loreen Freud DO   Signed by:   Almeta Monas CMA (AAMA) on 01/15/2010   Method used:   Electronically to        [facility] [address] [id number]* (retail)       [address] [place], [place]  [address]       Ph: [phone]       Fax: [phone]   RxID:   [id number] FUROSEMIDE 20 MG  TABS (FUROSEMIDE) take one tablet daily  #30 Each x 2   Entered by:   Almeta Monas CMA (AAMA)   Authorized by:   Loreen Freud DO   Signed by:   Almeta Monas CMA (AAMA) on 01/15/2010   Method used:   Electronically to        [facility] [address] #[id number]* (retail)       [address] [place], [place]  [address]       Ph: [phone]       Fax: [phone]   RxID:   [id number] FLUOXETINE HCL 20 MG CAPS (FLUOXETINE HCL) Take 1 by mouth two times a day.  #60 Each x 2   Entered by:   Almeta Monas CMA (AAMA)   Authorized by:   Loreen Freud DO   Signed by:   Almeta Monas CMA (AAMA) on 01/15/2010   Method used:   Electronically to        [facility] [id number]* (retail)       [address] [place], [place]  [address]       Ph: [phone]       Fax: [phone]   RxID:   [id number]

## 2010-04-05 NOTE — Progress Notes (Signed)
Summary: pt/inr results  Phone Note From Other Clinic   Caller: Earlyne Iba Summary of Call: pt/ inr results   according to phone note from 01/05/10 patient taking  1 1/2 tabs of the 5mg   every Friday and 1 tab all the rest of the week Initial call taken by: Doristine Devoid CMA,  January 10, 2010 3:35 PM  Follow-up for Phone Call        con't same dose --recheck 4 weeks  Follow-up by: Loreen Freud DO,  January 10, 2010 4:02 PM  Additional Follow-up for Phone Call Additional follow up Details #1::        Spk with Britta Mccreedy advise to continue the 7.5 on fridays and 5mg  on all the other days as previously done, she voiced understanding and will be in the office tomorrow....  Additional Follow-up by: Almeta Monas CMA Duncan Dull),  January 10, 2010 4:37 PM     Laboratory Results   Blood Tests     PT: 25.8 s   (Normal Range: 10.6-13.4)  INR: 2.4   (Normal Range: 0.88-1.12   Therap INR: 2.0-3.5)

## 2010-04-05 NOTE — Letter (Signed)
Summary: [facility]  [facility]   Imported By: Lanelle Bal 10/12/2009 09:15:56  _____________________________________________________________________  External Attachment:    Type:   Image     Comment:   External Document

## 2010-04-06 NOTE — Miscellaneous (Signed)
Summary: SN Orders/[facility]  SN Orders/[facility]   Imported By: Lanelle Bal 12/04/2009 13:31:04  _____________________________________________________________________  External Attachment:    Type:   Image     Comment:   External Document

## 2010-04-06 NOTE — Medication Information (Signed)
Summary: Letter Regarding Fluoxetine Dosage/[facility]  Letter Regarding Fluoxetine Dosage/[facility]   Imported By: Lanelle Bal 03/16/2009 08:30:19  _____________________________________________________________________  External Attachment:    Type:   Image     Comment:   External Document

## 2010-04-06 NOTE — Miscellaneous (Signed)
Summary: OT & PT Orders/[facility]  OT & PT Orders/[facility]   Imported By: Lanelle Bal 01/26/2010 09:03:13  _____________________________________________________________________  External Attachment:    Type:   Image     Comment:   External Document

## 2010-04-06 NOTE — Miscellaneous (Signed)
Summary: SN Orders & Care Plan/[facility]  SN Orders & Care Plan/[facility]   Imported By: Lanelle Bal 09/15/2009 09:17:53  _____________________________________________________________________  External Attachment:    Type:   Image     Comment:   External Document

## 2010-04-06 NOTE — Letter (Signed)
Summary: Encounter Notice/[facility]  Encounter Notice/[facility]   Imported By: Lanelle Bal 01/09/2010 13:53:42  _____________________________________________________________________  External Attachment:    Type:   Image     Comment:   External Document

## 2010-04-06 NOTE — Miscellaneous (Signed)
Summary: Care Plan/[facility]  Care Plan/[facility]   Imported By: Lanelle Bal 03/08/2009 11:23:41  _____________________________________________________________________  External Attachment:    Type:   Image     Comment:   External Document

## 2010-04-06 NOTE — Miscellaneous (Signed)
Summary: HA Orders/[facility]  HA Orders/[facility]   Imported By: Lanelle Bal 11/16/2009 10:10:44  _____________________________________________________________________  External Attachment:    Type:   Image     Comment:   External Document

## 2010-05-15 LAB — POCT I-STAT, CHEM 8
Chloride: 104 mEq/L (ref 96–112)
Creatinine, Ser: 2.1 mg/dL — ABNORMAL HIGH (ref 0.4–1.5)
Glucose, Bld: 115 mg/dL — ABNORMAL HIGH (ref 70–99)
Hemoglobin: 9.2 g/dL — ABNORMAL LOW (ref 13.0–17.0)
Potassium: 4.2 mEq/L (ref 3.5–5.1)
Sodium: 138 mEq/L (ref 135–145)

## 2010-05-15 LAB — BASIC METABOLIC PANEL
BUN: 21 mg/dL (ref 6–23)
Chloride: 103 mEq/L (ref 96–112)
Creatinine, Ser: 1.96 mg/dL — ABNORMAL HIGH (ref 0.4–1.5)
Glucose, Bld: 117 mg/dL — ABNORMAL HIGH (ref 70–99)
Potassium: 4.2 mEq/L (ref 3.5–5.1)

## 2010-05-15 LAB — DIFFERENTIAL
Lymphocytes Relative: 24 % (ref 12–46)
Lymphs Abs: 1.1 10*3/uL (ref 0.7–4.0)
Monocytes Relative: 8 % (ref 3–12)
Neutro Abs: 3 10*3/uL (ref 1.7–7.7)
Neutrophils Relative %: 65 % (ref 43–77)

## 2010-05-15 LAB — CBC
Hemoglobin: 8.6 g/dL — ABNORMAL LOW (ref 13.0–17.0)
Platelets: 180 10*3/uL (ref 150–400)
RBC: 2.81 MIL/uL — ABNORMAL LOW (ref 4.22–5.81)
WBC: 4.7 10*3/uL (ref 4.0–10.5)

## 2010-05-16 LAB — GLUCOSE, CAPILLARY
Glucose-Capillary: 102 mg/dL — ABNORMAL HIGH (ref 70–99)
Glucose-Capillary: 102 mg/dL — ABNORMAL HIGH (ref 70–99)
Glucose-Capillary: 104 mg/dL — ABNORMAL HIGH (ref 70–99)
Glucose-Capillary: 109 mg/dL — ABNORMAL HIGH (ref 70–99)
Glucose-Capillary: 109 mg/dL — ABNORMAL HIGH (ref 70–99)
Glucose-Capillary: 110 mg/dL — ABNORMAL HIGH (ref 70–99)
Glucose-Capillary: 110 mg/dL — ABNORMAL HIGH (ref 70–99)
Glucose-Capillary: 116 mg/dL — ABNORMAL HIGH (ref 70–99)
Glucose-Capillary: 119 mg/dL — ABNORMAL HIGH (ref 70–99)
Glucose-Capillary: 121 mg/dL — ABNORMAL HIGH (ref 70–99)
Glucose-Capillary: 122 mg/dL — ABNORMAL HIGH (ref 70–99)
Glucose-Capillary: 126 mg/dL — ABNORMAL HIGH (ref 70–99)
Glucose-Capillary: 127 mg/dL — ABNORMAL HIGH (ref 70–99)
Glucose-Capillary: 129 mg/dL — ABNORMAL HIGH (ref 70–99)
Glucose-Capillary: 131 mg/dL — ABNORMAL HIGH (ref 70–99)
Glucose-Capillary: 133 mg/dL — ABNORMAL HIGH (ref 70–99)
Glucose-Capillary: 136 mg/dL — ABNORMAL HIGH (ref 70–99)
Glucose-Capillary: 138 mg/dL — ABNORMAL HIGH (ref 70–99)
Glucose-Capillary: 138 mg/dL — ABNORMAL HIGH (ref 70–99)
Glucose-Capillary: 141 mg/dL — ABNORMAL HIGH (ref 70–99)
Glucose-Capillary: 142 mg/dL — ABNORMAL HIGH (ref 70–99)
Glucose-Capillary: 142 mg/dL — ABNORMAL HIGH (ref 70–99)
Glucose-Capillary: 143 mg/dL — ABNORMAL HIGH (ref 70–99)
Glucose-Capillary: 143 mg/dL — ABNORMAL HIGH (ref 70–99)
Glucose-Capillary: 144 mg/dL — ABNORMAL HIGH (ref 70–99)
Glucose-Capillary: 147 mg/dL — ABNORMAL HIGH (ref 70–99)
Glucose-Capillary: 148 mg/dL — ABNORMAL HIGH (ref 70–99)
Glucose-Capillary: 149 mg/dL — ABNORMAL HIGH (ref 70–99)
Glucose-Capillary: 153 mg/dL — ABNORMAL HIGH (ref 70–99)
Glucose-Capillary: 155 mg/dL — ABNORMAL HIGH (ref 70–99)
Glucose-Capillary: 159 mg/dL — ABNORMAL HIGH (ref 70–99)
Glucose-Capillary: 165 mg/dL — ABNORMAL HIGH (ref 70–99)
Glucose-Capillary: 168 mg/dL — ABNORMAL HIGH (ref 70–99)
Glucose-Capillary: 169 mg/dL — ABNORMAL HIGH (ref 70–99)
Glucose-Capillary: 177 mg/dL — ABNORMAL HIGH (ref 70–99)
Glucose-Capillary: 178 mg/dL — ABNORMAL HIGH (ref 70–99)
Glucose-Capillary: 179 mg/dL — ABNORMAL HIGH (ref 70–99)
Glucose-Capillary: 185 mg/dL — ABNORMAL HIGH (ref 70–99)
Glucose-Capillary: 188 mg/dL — ABNORMAL HIGH (ref 70–99)
Glucose-Capillary: 203 mg/dL — ABNORMAL HIGH (ref 70–99)
Glucose-Capillary: 222 mg/dL — ABNORMAL HIGH (ref 70–99)
Glucose-Capillary: 245 mg/dL — ABNORMAL HIGH (ref 70–99)
Glucose-Capillary: 251 mg/dL — ABNORMAL HIGH (ref 70–99)
Glucose-Capillary: 264 mg/dL — ABNORMAL HIGH (ref 70–99)
Glucose-Capillary: 267 mg/dL — ABNORMAL HIGH (ref 70–99)
Glucose-Capillary: 275 mg/dL — ABNORMAL HIGH (ref 70–99)
Glucose-Capillary: 279 mg/dL — ABNORMAL HIGH (ref 70–99)
Glucose-Capillary: 75 mg/dL (ref 70–99)
Glucose-Capillary: 86 mg/dL (ref 70–99)
Glucose-Capillary: 92 mg/dL (ref 70–99)
Glucose-Capillary: 96 mg/dL (ref 70–99)
Glucose-Capillary: 96 mg/dL (ref 70–99)
Glucose-Capillary: 98 mg/dL (ref 70–99)

## 2010-05-16 LAB — BLOOD GAS, ARTERIAL
Acid-Base Excess: 1 mmol/L (ref 0.0–2.0)
Acid-base deficit: 2.3 mmol/L — ABNORMAL HIGH (ref 0.0–2.0)
Acid-base deficit: 3.7 mmol/L — ABNORMAL HIGH (ref 0.0–2.0)
Bicarbonate: 20.1 mEq/L (ref 20.0–24.0)
Bicarbonate: 21.9 mEq/L (ref 20.0–24.0)
Bicarbonate: 23.5 mEq/L (ref 20.0–24.0)
Bicarbonate: 25.7 mEq/L — ABNORMAL HIGH (ref 20.0–24.0)
Drawn by: 234041
FIO2: 0.5 %
FIO2: 40 %
FIO2: 50 %
MECHVT: 600 mL
MECHVT: 600 mL
O2 Saturation: 91.2 %
O2 Saturation: 97.3 %
O2 Saturation: 99.5 %
PEEP: 5 cmH2O
PEEP: 5 cmH2O
Patient temperature: 98.6
Patient temperature: 98.6
Pressure support: 5 cmH2O
RATE: 20 resp/min
TCO2: 21 mmol/L (ref 0–100)
TCO2: 22.9 mmol/L (ref 0–100)
TCO2: 24.6 mmol/L (ref 0–100)
pCO2 arterial: 29.8 mmHg — ABNORMAL LOW (ref 35.0–45.0)
pCO2 arterial: 32.1 mmHg — ABNORMAL LOW (ref 35.0–45.0)
pCO2 arterial: 32.6 mmHg — ABNORMAL LOW (ref 35.0–45.0)
pH, Arterial: 7.442 (ref 7.350–7.450)
pH, Arterial: 7.476 — ABNORMAL HIGH (ref 7.350–7.450)
pO2, Arterial: 127 mmHg — ABNORMAL HIGH (ref 80.0–100.0)
pO2, Arterial: 57.6 mmHg — ABNORMAL LOW (ref 80.0–100.0)
pO2, Arterial: 93.8 mmHg (ref 80.0–100.0)

## 2010-05-16 LAB — RENAL FUNCTION PANEL
Albumin: 2.6 g/dL — ABNORMAL LOW (ref 3.5–5.2)
BUN: 22 mg/dL (ref 6–23)
CO2: 24 meq/L (ref 19–32)
Calcium: 8.5 mg/dL (ref 8.4–10.5)
Calcium: 8.7 mg/dL (ref 8.4–10.5)
Chloride: 109 meq/L (ref 96–112)
Creatinine, Ser: 2.36 mg/dL — ABNORMAL HIGH (ref 0.4–1.5)
Creatinine, Ser: 2.94 mg/dL — ABNORMAL HIGH (ref 0.4–1.5)
GFR calc Af Amer: 26 mL/min — ABNORMAL LOW (ref >60)
GFR calc non Af Amer: 21 mL/min — ABNORMAL LOW (ref >60)
GFR calc non Af Amer: 27 mL/min — ABNORMAL LOW
Glucose, Bld: 93 mg/dL (ref 70–99)
Phosphorus: 2.7 mg/dL (ref 2.3–4.6)
Phosphorus: 3 mg/dL (ref 2.3–4.6)
Potassium: 3.9 meq/L (ref 3.5–5.1)
Sodium: 136 mEq/L (ref 135–145)
Sodium: 138 meq/L (ref 135–145)

## 2010-05-16 LAB — CBC
HCT: 24.6 % — ABNORMAL LOW (ref 39.0–52.0)
HCT: 26.4 % — ABNORMAL LOW (ref 39.0–52.0)
HCT: 27.2 % — ABNORMAL LOW (ref 39.0–52.0)
HCT: 27.9 % — ABNORMAL LOW (ref 39.0–52.0)
HCT: 28.4 % — ABNORMAL LOW (ref 39.0–52.0)
HCT: 32.9 % — ABNORMAL LOW (ref 39.0–52.0)
Hemoglobin: 10 g/dL — ABNORMAL LOW (ref 13.0–17.0)
Hemoglobin: 7.7 g/dL — ABNORMAL LOW (ref 13.0–17.0)
Hemoglobin: 8.3 g/dL — ABNORMAL LOW (ref 13.0–17.0)
Hemoglobin: 8.7 g/dL — ABNORMAL LOW (ref 13.0–17.0)
Hemoglobin: 8.8 g/dL — ABNORMAL LOW (ref 13.0–17.0)
Hemoglobin: 9 g/dL — ABNORMAL LOW (ref 13.0–17.0)
Hemoglobin: 9.9 g/dL — ABNORMAL LOW (ref 13.0–17.0)
MCH: 29.6 pg (ref 26.0–34.0)
MCH: 29.7 pg (ref 26.0–34.0)
MCH: 29.7 pg (ref 26.0–34.0)
MCH: 29.9 pg (ref 26.0–34.0)
MCH: 30.2 pg (ref 26.0–34.0)
MCH: 30.3 pg (ref 26.0–34.0)
MCHC: 30.7 g/dL (ref 30.0–36.0)
MCHC: 31.3 g/dL (ref 30.0–36.0)
MCHC: 32.2 g/dL (ref 30.0–36.0)
MCHC: 32.9 g/dL (ref 30.0–36.0)
MCV: 100.3 fL — ABNORMAL HIGH (ref 78.0–100.0)
MCV: 93 fL (ref 78.0–100.0)
MCV: 94.7 fL (ref 78.0–100.0)
MCV: 95 fL (ref 78.0–100.0)
MCV: 96.1 fL (ref 78.0–100.0)
MCV: 97.3 fL (ref 78.0–100.0)
MCV: 97.3 fL (ref 78.0–100.0)
MCV: 97.6 fL (ref 78.0–100.0)
Platelets: 120 10*3/uL — ABNORMAL LOW (ref 150–400)
Platelets: 130 10*3/uL — ABNORMAL LOW (ref 150–400)
Platelets: 130 10*3/uL — ABNORMAL LOW (ref 150–400)
Platelets: 141 10*3/uL — ABNORMAL LOW (ref 150–400)
Platelets: 142 10*3/uL — ABNORMAL LOW (ref 150–400)
Platelets: 161 10*3/uL (ref 150–400)
RBC: 2.59 MIL/uL — ABNORMAL LOW (ref 4.22–5.81)
RBC: 2.63 MIL/uL — ABNORMAL LOW (ref 4.22–5.81)
RBC: 2.83 MIL/uL — ABNORMAL LOW (ref 4.22–5.81)
RBC: 2.84 MIL/uL — ABNORMAL LOW (ref 4.22–5.81)
RBC: 2.92 MIL/uL — ABNORMAL LOW (ref 4.22–5.81)
RBC: 2.94 MIL/uL — ABNORMAL LOW (ref 4.22–5.81)
RBC: 3.02 MIL/uL — ABNORMAL LOW (ref 4.22–5.81)
RBC: 3.28 MIL/uL — ABNORMAL LOW (ref 4.22–5.81)
RDW: 15.2 % (ref 11.5–15.5)
RDW: 15.4 % (ref 11.5–15.5)
RDW: 15.8 % — ABNORMAL HIGH (ref 11.5–15.5)
RDW: 15.8 % — ABNORMAL HIGH (ref 11.5–15.5)
RDW: 15.8 % — ABNORMAL HIGH (ref 11.5–15.5)
RDW: 15.9 % — ABNORMAL HIGH (ref 11.5–15.5)
RDW: 16.1 % — ABNORMAL HIGH (ref 11.5–15.5)
RDW: 16.2 % — ABNORMAL HIGH (ref 11.5–15.5)
RDW: 16.5 % — ABNORMAL HIGH (ref 11.5–15.5)
RDW: 16.5 % — ABNORMAL HIGH (ref 11.5–15.5)
WBC: 14.2 10*3/uL — ABNORMAL HIGH (ref 4.0–10.5)
WBC: 5 10*3/uL (ref 4.0–10.5)
WBC: 5.2 10*3/uL (ref 4.0–10.5)
WBC: 5.4 10*3/uL (ref 4.0–10.5)
WBC: 5.6 10*3/uL (ref 4.0–10.5)
WBC: 5.9 10*3/uL (ref 4.0–10.5)
WBC: 6.3 10*3/uL (ref 4.0–10.5)
WBC: 6.9 10*3/uL (ref 4.0–10.5)
WBC: 7.2 10*3/uL (ref 4.0–10.5)
WBC: 7.4 10*3/uL (ref 4.0–10.5)
WBC: 7.9 10*3/uL (ref 4.0–10.5)

## 2010-05-16 LAB — D-DIMER, QUANTITATIVE: D-Dimer, Quant: 1.69 ug/mL-FEU — ABNORMAL HIGH (ref 0.00–0.48)

## 2010-05-16 LAB — BASIC METABOLIC PANEL WITH GFR
BUN: 36 mg/dL — ABNORMAL HIGH (ref 6–23)
CO2: 24 meq/L (ref 19–32)
Calcium: 8.4 mg/dL (ref 8.4–10.5)
Chloride: 101 meq/L (ref 96–112)
Creatinine, Ser: 3.29 mg/dL — ABNORMAL HIGH (ref 0.4–1.5)
GFR calc non Af Amer: 19 mL/min — ABNORMAL LOW
Glucose, Bld: 92 mg/dL (ref 70–99)
Potassium: 4 meq/L (ref 3.5–5.1)
Sodium: 133 meq/L — ABNORMAL LOW (ref 135–145)

## 2010-05-16 LAB — CLOSTRIDIUM DIFFICILE EIA: C difficile Toxins A+B, EIA: 29

## 2010-05-16 LAB — BASIC METABOLIC PANEL
BUN: 33 mg/dL — ABNORMAL HIGH (ref 6–23)
BUN: 33 mg/dL — ABNORMAL HIGH (ref 6–23)
BUN: 43 mg/dL — ABNORMAL HIGH (ref 6–23)
BUN: 46 mg/dL — ABNORMAL HIGH (ref 6–23)
CO2: 19 mEq/L (ref 19–32)
CO2: 30 mEq/L (ref 19–32)
CO2: 31 mEq/L (ref 19–32)
CO2: 33 mEq/L — ABNORMAL HIGH (ref 19–32)
Calcium: 8.5 mg/dL (ref 8.4–10.5)
Calcium: 8.8 mg/dL (ref 8.4–10.5)
Chloride: 102 mEq/L (ref 96–112)
Chloride: 102 mEq/L (ref 96–112)
Chloride: 106 mEq/L (ref 96–112)
Chloride: 107 mEq/L (ref 96–112)
Creatinine, Ser: 2.26 mg/dL — ABNORMAL HIGH (ref 0.4–1.5)
Creatinine, Ser: 2.39 mg/dL — ABNORMAL HIGH (ref 0.4–1.5)
Creatinine, Ser: 2.67 mg/dL — ABNORMAL HIGH (ref 0.4–1.5)
Creatinine, Ser: 3.42 mg/dL — ABNORMAL HIGH (ref 0.4–1.5)
Creatinine, Ser: 3.54 mg/dL — ABNORMAL HIGH (ref 0.4–1.5)
GFR calc Af Amer: 21 mL/min — ABNORMAL LOW (ref >60)
GFR calc Af Amer: 22 mL/min — ABNORMAL LOW (ref >60)
GFR calc Af Amer: 33 mL/min — ABNORMAL LOW (ref >60)
GFR calc non Af Amer: 18 mL/min — ABNORMAL LOW (ref >60)
GFR calc non Af Amer: 24 mL/min — ABNORMAL LOW (ref >60)
Glucose, Bld: 132 mg/dL — ABNORMAL HIGH (ref 70–99)
Potassium: 4.4 mEq/L (ref 3.5–5.1)
Sodium: 144 mEq/L (ref 135–145)

## 2010-05-16 LAB — CULTURE, RESPIRATORY W GRAM STAIN

## 2010-05-16 LAB — IRON AND TIBC
Iron: 41 ug/dL — ABNORMAL LOW (ref 42–135)
Saturation Ratios: 17 % — ABNORMAL LOW (ref 20–55)
TIBC: 248 ug/dL (ref 215–435)
UIBC: 207 ug/dL

## 2010-05-16 LAB — POCT I-STAT 3, ART BLOOD GAS (G3+)
Acid-base deficit: 10 mmol/L — ABNORMAL HIGH (ref 0.0–2.0)
Bicarbonate: 16.1 mEq/L — ABNORMAL LOW (ref 20.0–24.0)
Bicarbonate: 16.3 mEq/L — ABNORMAL LOW (ref 20.0–24.0)
O2 Saturation: 100 %
Patient temperature: 98.6
pCO2 arterial: 43.8 mmHg (ref 35.0–45.0)
pH, Arterial: 7.178 — CL (ref 7.350–7.450)
pO2, Arterial: 194 mmHg — ABNORMAL HIGH (ref 80.0–100.0)

## 2010-05-16 LAB — HEPARIN LEVEL (UNFRACTIONATED)
Heparin Unfractionated: 0.1 IU/mL — ABNORMAL LOW (ref 0.30–0.70)
Heparin Unfractionated: 0.17 IU/mL — ABNORMAL LOW (ref 0.30–0.70)
Heparin Unfractionated: 0.2 IU/mL — ABNORMAL LOW (ref 0.30–0.70)
Heparin Unfractionated: 0.34 IU/mL (ref 0.30–0.70)
Heparin Unfractionated: 0.43 IU/mL (ref 0.30–0.70)

## 2010-05-16 LAB — SAMPLE TO BLOOD BANK

## 2010-05-16 LAB — TYPE AND SCREEN

## 2010-05-16 LAB — MAGNESIUM
Magnesium: 1.9 mg/dL (ref 1.5–2.5)
Magnesium: 2 mg/dL (ref 1.5–2.5)

## 2010-05-16 LAB — PROTIME-INR
INR: 1.16 (ref 0.00–1.49)
INR: 1.19 (ref 0.00–1.49)
INR: 1.36 (ref 0.00–1.49)
INR: 1.4 (ref 0.00–1.49)
INR: 1.62 — ABNORMAL HIGH (ref 0.00–1.49)
Prothrombin Time: 15 s (ref 11.6–15.2)
Prothrombin Time: 15.3 s — ABNORMAL HIGH (ref 11.6–15.2)
Prothrombin Time: 16.2 seconds — ABNORMAL HIGH (ref 11.6–15.2)
Prothrombin Time: 17 s — ABNORMAL HIGH (ref 11.6–15.2)
Prothrombin Time: 17.4 seconds — ABNORMAL HIGH (ref 11.6–15.2)
Prothrombin Time: 19.4 s — ABNORMAL HIGH (ref 11.6–15.2)

## 2010-05-16 LAB — LACTIC ACID, PLASMA
Lactic Acid, Venous: 1.4 mmol/L (ref 0.5–2.2)
Lactic Acid, Venous: 13.1 mmol/L — ABNORMAL HIGH (ref 0.5–2.2)

## 2010-05-16 LAB — HEPATIC FUNCTION PANEL
Albumin: 3.1 g/dL — ABNORMAL LOW (ref 3.5–5.2)
Alkaline Phosphatase: 41 U/L (ref 39–117)
Bilirubin, Direct: 0.3 mg/dL (ref 0.0–0.3)
Indirect Bilirubin: 0.5 mg/dL (ref 0.3–0.9)
Indirect Bilirubin: 0.8 mg/dL (ref 0.3–0.9)
Total Protein: 6 g/dL (ref 6.0–8.3)
Total Protein: 6.9 g/dL (ref 6.0–8.3)

## 2010-05-16 LAB — CULTURE, BLOOD (ROUTINE X 2): Culture: NO GROWTH

## 2010-05-16 LAB — COMPREHENSIVE METABOLIC PANEL
ALT: 141 U/L — ABNORMAL HIGH (ref 0–53)
ALT: 490 U/L — ABNORMAL HIGH (ref 0–53)
ALT: 697 U/L — ABNORMAL HIGH (ref 0–53)
AST: 237 U/L — ABNORMAL HIGH (ref 0–37)
AST: 671 U/L — ABNORMAL HIGH (ref 0–37)
Albumin: 2.6 g/dL — ABNORMAL LOW (ref 3.5–5.2)
Alkaline Phosphatase: 37 U/L — ABNORMAL LOW (ref 39–117)
Alkaline Phosphatase: 38 U/L — ABNORMAL LOW (ref 39–117)
CO2: 25 mEq/L (ref 19–32)
CO2: 32 mEq/L (ref 19–32)
Calcium: 8.3 mg/dL — ABNORMAL LOW (ref 8.4–10.5)
Chloride: 104 mEq/L (ref 96–112)
GFR calc Af Amer: 23 mL/min — ABNORMAL LOW (ref >60)
GFR calc non Af Amer: 21 mL/min — ABNORMAL LOW (ref >60)
Glucose, Bld: 117 mg/dL — ABNORMAL HIGH (ref 70–99)
Glucose, Bld: 135 mg/dL — ABNORMAL HIGH (ref 70–99)
Potassium: 2.8 mEq/L — ABNORMAL LOW (ref 3.5–5.1)
Potassium: 4.1 mEq/L (ref 3.5–5.1)
Sodium: 138 mEq/L (ref 135–145)
Sodium: 141 mEq/L (ref 135–145)
Sodium: 142 mEq/L (ref 135–145)
Total Bilirubin: 0.7 mg/dL (ref 0.3–1.2)
Total Protein: 6.1 g/dL (ref 6.0–8.3)
Total Protein: 6.3 g/dL (ref 6.0–8.3)

## 2010-05-16 LAB — POCT CARDIAC MARKERS: Troponin i, poc: <0.05 ng/mL (ref 0.00–0.09)

## 2010-05-16 LAB — DIFFERENTIAL
Basophils Absolute: 0.1 10*3/uL (ref 0.0–0.1)
Basophils Relative: 1 % (ref 0–1)
Eosinophils Relative: 1 % (ref 0–5)
Lymphocytes Relative: 12 % (ref 12–46)
Lymphs Abs: 1.7 10*3/uL (ref 0.7–4.0)
Monocytes Absolute: 1.3 10*3/uL — ABNORMAL HIGH (ref 0.1–1.0)
Monocytes Relative: 7 % (ref 3–12)
Monocytes Relative: 9 % (ref 3–12)
Neutro Abs: 11 10*3/uL — ABNORMAL HIGH (ref 1.7–7.7)

## 2010-05-16 LAB — URINALYSIS, ROUTINE W REFLEX MICROSCOPIC
Bilirubin Urine: NEGATIVE
Bilirubin Urine: NEGATIVE
Glucose, UA: NEGATIVE mg/dL
Ketones, ur: NEGATIVE mg/dL
Ketones, ur: NEGATIVE mg/dL
Nitrite: NEGATIVE
Specific Gravity, Urine: 1.008 (ref 1.005–1.030)
Urobilinogen, UA: 1 mg/dL (ref 0.0–1.0)
pH: 5.5 (ref 5.0–8.0)

## 2010-05-16 LAB — CARBOXYHEMOGLOBIN
Carboxyhemoglobin: 1.3 % (ref 0.5–1.5)
Total hemoglobin: 9.1 g/dL — ABNORMAL LOW (ref 13.5–18.0)

## 2010-05-16 LAB — URINE MICROSCOPIC-ADD ON

## 2010-05-16 LAB — APTT: aPTT: 38 seconds — ABNORMAL HIGH (ref 24–37)

## 2010-05-16 LAB — CARDIAC PANEL(CRET KIN+CKTOT+MB+TROPI)
CK, MB: 17.3 ng/mL (ref 0.3–4.0)
Total CK: 1031 U/L — ABNORMAL HIGH (ref 7–232)
Total CK: 786 U/L — ABNORMAL HIGH (ref 7–232)
Total CK: 825 U/L — ABNORMAL HIGH (ref 7–232)
Troponin I: 0.64 ng/mL (ref 0.00–0.06)

## 2010-05-16 LAB — HEMOCCULT GUIAC POC 1CARD (OFFICE): Fecal Occult Bld: NEGATIVE

## 2010-05-16 LAB — WOUND CULTURE

## 2010-05-16 LAB — CK TOTAL AND CKMB (NOT AT ARMC)
CK, MB: 8.9 ng/mL (ref 0.3–4.0)
Total CK: 348 U/L — ABNORMAL HIGH (ref 7–232)

## 2010-05-16 LAB — SEDIMENTATION RATE: Sed Rate: 114 mm/h — ABNORMAL HIGH (ref 0–16)

## 2010-05-16 LAB — CLOSTRIDIUM DIFFICILE BY PCR

## 2010-05-16 LAB — VITAMIN B12: Vitamin B-12: 1009 pg/mL — ABNORMAL HIGH (ref 211–911)

## 2010-05-16 LAB — BRAIN NATRIURETIC PEPTIDE: Pro B Natriuretic peptide (BNP): 205 pg/mL — ABNORMAL HIGH (ref 0.0–100.0)

## 2010-05-16 LAB — TROPONIN I: Troponin I: 0.15 ng/mL — ABNORMAL HIGH (ref 0.00–0.06)

## 2010-05-16 LAB — VANCOMYCIN, RANDOM: Vancomycin Rm: 18.3 ug/mL

## 2010-05-16 LAB — FERRITIN: Ferritin: 291 ng/mL (ref 22–322)

## 2010-05-16 LAB — URINE CULTURE

## 2010-05-16 LAB — LEGIONELLA ANTIGEN, URINE

## 2010-05-16 LAB — FOLATE: Folate: 12.6 ng/mL

## 2010-05-16 LAB — LIPASE, BLOOD: Lipase: 17 U/L (ref 11–59)

## 2010-05-16 LAB — RETICULOCYTES
Retic Count, Absolute: 78.3 10*3/uL (ref 19.0–186.0)
Retic Ct Pct: 2.9 % (ref 0.4–3.1)

## 2010-05-16 LAB — PHOSPHORUS: Phosphorus: 3.1 mg/dL (ref 2.3–4.6)

## 2010-05-17 LAB — URINALYSIS, ROUTINE W REFLEX MICROSCOPIC
Bilirubin Urine: NEGATIVE
Glucose, UA: NEGATIVE mg/dL
Hgb urine dipstick: NEGATIVE
Ketones, ur: NEGATIVE mg/dL
Nitrite: NEGATIVE
Protein, ur: NEGATIVE mg/dL
Specific Gravity, Urine: 1.012 (ref 1.005–1.030)
Urobilinogen, UA: 0.2 mg/dL (ref 0.0–1.0)
pH: 7.5 (ref 5.0–8.0)

## 2010-05-17 LAB — COMPREHENSIVE METABOLIC PANEL
ALT: 10 U/L (ref 0–53)
AST: 17 U/L (ref 0–37)
Albumin: 4 g/dL (ref 3.5–5.2)
Alkaline Phosphatase: 36 U/L — ABNORMAL LOW (ref 39–117)
BUN: 36 mg/dL — ABNORMAL HIGH (ref 6–23)
CO2: 30 mEq/L (ref 19–32)
Calcium: 9.4 mg/dL (ref 8.4–10.5)
Chloride: 106 mEq/L (ref 96–112)
Creatinine, Ser: 1.88 mg/dL — ABNORMAL HIGH (ref 0.4–1.5)
GFR calc Af Amer: 43 mL/min — ABNORMAL LOW (ref >60)
GFR calc non Af Amer: 36 mL/min — ABNORMAL LOW (ref >60)
Glucose, Bld: 105 mg/dL — ABNORMAL HIGH (ref 70–99)
Potassium: 4.4 mEq/L (ref 3.5–5.1)
Sodium: 142 mEq/L (ref 135–145)
Total Bilirubin: 0.5 mg/dL (ref 0.3–1.2)
Total Protein: 7.4 g/dL (ref 6.0–8.3)

## 2010-05-17 LAB — APTT: aPTT: 36 seconds (ref 24–37)

## 2010-05-17 LAB — URINE MICROSCOPIC-ADD ON

## 2010-05-17 LAB — GLUCOSE, CAPILLARY: Glucose-Capillary: 98 mg/dL (ref 70–99)

## 2010-05-17 LAB — URINE CULTURE
Colony Count: >=100000
Culture  Setup Time: 201109100121
Special Requests: NEGATIVE

## 2010-05-17 LAB — SURGICAL PCR SCREEN
MRSA, PCR: POSITIVE — AB
Staphylococcus aureus: POSITIVE — AB

## 2010-05-17 LAB — PROTIME-INR
INR: 1.08 (ref 0.00–1.49)
Prothrombin Time: 14.2 seconds (ref 11.6–15.2)

## 2010-05-17 LAB — CBC
HCT: 32 % — ABNORMAL LOW (ref 39.0–52.0)
Hemoglobin: 10.7 g/dL — ABNORMAL LOW (ref 13.0–17.0)
MCH: 31.3 pg (ref 26.0–34.0)
MCHC: 33.6 g/dL (ref 30.0–36.0)
MCV: 93.3 fL (ref 78.0–100.0)
Platelets: 167 10*3/uL (ref 150–400)
RBC: 3.43 MIL/uL — ABNORMAL LOW (ref 4.22–5.81)
RDW: 16.1 % — ABNORMAL HIGH (ref 11.5–15.5)
WBC: 5.2 10*3/uL (ref 4.0–10.5)

## 2010-05-27 ENCOUNTER — Other Ambulatory Visit: Payer: Self-pay | Admitting: Family Medicine

## 2010-06-03 ENCOUNTER — Other Ambulatory Visit: Payer: Self-pay | Admitting: Family Medicine

## 2010-06-04 ENCOUNTER — Telehealth: Payer: Self-pay | Admitting: *Deleted

## 2010-06-04 NOTE — Telephone Encounter (Signed)
Pt wife left VM that Pt receive letter for jury duty. Pt wife indicated that Pt is unable to serve and would like Dr Laury Axon to write a letter noting this. Please advise

## 2010-06-04 NOTE — Telephone Encounter (Signed)
Last seen 01/02/10 and filled 03/30/10 please advise     KP

## 2010-06-05 NOTE — Telephone Encounter (Signed)
Letter completed and faxed to patient, copy also left at check In..... KP

## 2010-06-05 NOTE — Telephone Encounter (Signed)
Ok to give him a note excusing him from jury secondary to several complicated medical conditions.

## 2010-06-07 LAB — COMPREHENSIVE METABOLIC PANEL WITH GFR
ALT: 9 U/L (ref 0–53)
ALT: <8 U/L (ref 0–53)
AST: 11 U/L (ref 0–37)
AST: 13 U/L (ref 0–37)
Albumin: 2.5 g/dL — ABNORMAL LOW (ref 3.5–5.2)
Albumin: 2.6 g/dL — ABNORMAL LOW (ref 3.5–5.2)
Alkaline Phosphatase: 33 U/L — ABNORMAL LOW (ref 39–117)
Alkaline Phosphatase: 38 U/L — ABNORMAL LOW (ref 39–117)
BUN: 46 mg/dL — ABNORMAL HIGH (ref 6–23)
BUN: 48 mg/dL — ABNORMAL HIGH (ref 6–23)
CO2: 23 meq/L (ref 19–32)
CO2: 26 meq/L (ref 19–32)
Calcium: 8.4 mg/dL (ref 8.4–10.5)
Calcium: 8.4 mg/dL (ref 8.4–10.5)
Chloride: 104 meq/L (ref 96–112)
Chloride: 107 meq/L (ref 96–112)
Creatinine, Ser: 2.36 mg/dL — ABNORMAL HIGH (ref 0.4–1.5)
Creatinine, Ser: 2.64 mg/dL — ABNORMAL HIGH (ref 0.4–1.5)
GFR calc non Af Amer: 24 mL/min — ABNORMAL LOW
GFR calc non Af Amer: 28 mL/min — ABNORMAL LOW
Glucose, Bld: 116 mg/dL — ABNORMAL HIGH (ref 70–99)
Glucose, Bld: 132 mg/dL — ABNORMAL HIGH (ref 70–99)
Potassium: 4.1 meq/L (ref 3.5–5.1)
Potassium: 4.3 meq/L (ref 3.5–5.1)
Sodium: 136 meq/L (ref 135–145)
Sodium: 138 meq/L (ref 135–145)
Total Bilirubin: 0.4 mg/dL (ref 0.3–1.2)
Total Bilirubin: 0.6 mg/dL (ref 0.3–1.2)
Total Protein: 6.2 g/dL (ref 6.0–8.3)
Total Protein: 6.4 g/dL (ref 6.0–8.3)

## 2010-06-07 LAB — COMPREHENSIVE METABOLIC PANEL
AST: 15 U/L (ref 0–37)
Albumin: 2.4 g/dL — ABNORMAL LOW (ref 3.5–5.2)
Albumin: 2.9 g/dL — ABNORMAL LOW (ref 3.5–5.2)
Alkaline Phosphatase: 41 U/L (ref 39–117)
BUN: 29 mg/dL — ABNORMAL HIGH (ref 6–23)
BUN: 38 mg/dL — ABNORMAL HIGH (ref 6–23)
CO2: 26 mEq/L (ref 19–32)
Calcium: 8.5 mg/dL (ref 8.4–10.5)
Calcium: 8.7 mg/dL (ref 8.4–10.5)
Chloride: 102 mEq/L (ref 96–112)
Creatinine, Ser: 1.79 mg/dL — ABNORMAL HIGH (ref 0.4–1.5)
Creatinine, Ser: 2.05 mg/dL — ABNORMAL HIGH (ref 0.4–1.5)
Creatinine, Ser: 2.41 mg/dL — ABNORMAL HIGH (ref 0.4–1.5)
GFR calc Af Amer: 39 mL/min — ABNORMAL LOW (ref >60)
GFR calc non Af Amer: 27 mL/min — ABNORMAL LOW (ref >60)
GFR calc non Af Amer: 32 mL/min — ABNORMAL LOW (ref >60)
Glucose, Bld: 155 mg/dL — ABNORMAL HIGH (ref 70–99)
Glucose, Bld: 157 mg/dL — ABNORMAL HIGH (ref 70–99)
Potassium: 4 mEq/L (ref 3.5–5.1)
Total Bilirubin: 0.6 mg/dL (ref 0.3–1.2)
Total Bilirubin: 0.8 mg/dL (ref 0.3–1.2)
Total Protein: 6.9 g/dL (ref 6.0–8.3)

## 2010-06-07 LAB — DIFFERENTIAL
Basophils Absolute: 0 10*3/uL (ref 0.0–0.1)
Basophils Absolute: 0 10*3/uL (ref 0.0–0.1)
Basophils Absolute: 0 10*3/uL (ref 0.0–0.1)
Basophils Absolute: 0 10*3/uL (ref 0.0–0.1)
Basophils Absolute: 0 10*3/uL (ref 0.0–0.1)
Basophils Relative: 0 % (ref 0–1)
Basophils Relative: 0 % (ref 0–1)
Basophils Relative: 0 % (ref 0–1)
Basophils Relative: 1 % (ref 0–1)
Eosinophils Absolute: 0.1 10*3/uL (ref 0.0–0.7)
Eosinophils Absolute: 0.1 10*3/uL (ref 0.0–0.7)
Eosinophils Absolute: 0.1 10*3/uL (ref 0.0–0.7)
Eosinophils Relative: 2 % (ref 0–5)
Eosinophils Relative: 2 % (ref 0–5)
Eosinophils Relative: 3 % (ref 0–5)
Eosinophils Relative: 3 % (ref 0–5)
Lymphocytes Relative: 12 % (ref 12–46)
Lymphocytes Relative: 16 % (ref 12–46)
Lymphocytes Relative: 19 % (ref 12–46)
Lymphocytes Relative: 21 % (ref 12–46)
Lymphocytes Relative: 29 % (ref 12–46)
Lymphs Abs: 0.6 10*3/uL — ABNORMAL LOW (ref 0.7–4.0)
Lymphs Abs: 0.6 10*3/uL — ABNORMAL LOW (ref 0.7–4.0)
Lymphs Abs: 0.9 10*3/uL (ref 0.7–4.0)
Monocytes Absolute: 0.4 10*3/uL (ref 0.1–1.0)
Monocytes Absolute: 0.5 10*3/uL (ref 0.1–1.0)
Monocytes Absolute: 0.5 10*3/uL (ref 0.1–1.0)
Monocytes Absolute: 0.6 10*3/uL (ref 0.1–1.0)
Monocytes Relative: 10 % (ref 3–12)
Monocytes Relative: 11 % (ref 3–12)
Monocytes Relative: 11 % (ref 3–12)
Monocytes Relative: 9 % (ref 3–12)
Neutro Abs: 2.4 10*3/uL (ref 1.7–7.7)
Neutro Abs: 2.8 10*3/uL (ref 1.7–7.7)
Neutro Abs: 3.2 10*3/uL (ref 1.7–7.7)
Neutro Abs: 3.3 10*3/uL (ref 1.7–7.7)
Neutro Abs: 4.2 10*3/uL (ref 1.7–7.7)
Neutrophils Relative %: 57 % (ref 43–77)
Neutrophils Relative %: 65 % (ref 43–77)
Neutrophils Relative %: 68 % (ref 43–77)
Neutrophils Relative %: 71 % (ref 43–77)
Neutrophils Relative %: 76 % (ref 43–77)

## 2010-06-07 LAB — RENAL FUNCTION PANEL
Albumin: 2.4 g/dL — ABNORMAL LOW (ref 3.5–5.2)
BUN: 20 mg/dL (ref 6–23)
BUN: 35 mg/dL — ABNORMAL HIGH (ref 6–23)
CO2: 25 mEq/L (ref 19–32)
Calcium: 8.5 mg/dL (ref 8.4–10.5)
Chloride: 108 mEq/L (ref 96–112)
Creatinine, Ser: 2.15 mg/dL — ABNORMAL HIGH (ref 0.4–1.5)
GFR calc Af Amer: 37 mL/min — ABNORMAL LOW (ref >60)
GFR calc non Af Amer: 31 mL/min — ABNORMAL LOW (ref >60)
Glucose, Bld: 105 mg/dL — ABNORMAL HIGH (ref 70–99)
Phosphorus: 2.9 mg/dL (ref 2.3–4.6)
Potassium: 4.1 mEq/L (ref 3.5–5.1)
Sodium: 141 mEq/L (ref 135–145)

## 2010-06-07 LAB — GLUCOSE, CAPILLARY
Glucose-Capillary: 101 mg/dL — ABNORMAL HIGH (ref 70–99)
Glucose-Capillary: 102 mg/dL — ABNORMAL HIGH (ref 70–99)
Glucose-Capillary: 102 mg/dL — ABNORMAL HIGH (ref 70–99)
Glucose-Capillary: 105 mg/dL — ABNORMAL HIGH (ref 70–99)
Glucose-Capillary: 105 mg/dL — ABNORMAL HIGH (ref 70–99)
Glucose-Capillary: 106 mg/dL — ABNORMAL HIGH (ref 70–99)
Glucose-Capillary: 106 mg/dL — ABNORMAL HIGH (ref 70–99)
Glucose-Capillary: 109 mg/dL — ABNORMAL HIGH (ref 70–99)
Glucose-Capillary: 112 mg/dL — ABNORMAL HIGH (ref 70–99)
Glucose-Capillary: 114 mg/dL — ABNORMAL HIGH (ref 70–99)
Glucose-Capillary: 114 mg/dL — ABNORMAL HIGH (ref 70–99)
Glucose-Capillary: 116 mg/dL — ABNORMAL HIGH (ref 70–99)
Glucose-Capillary: 122 mg/dL — ABNORMAL HIGH (ref 70–99)
Glucose-Capillary: 125 mg/dL — ABNORMAL HIGH (ref 70–99)
Glucose-Capillary: 129 mg/dL — ABNORMAL HIGH (ref 70–99)
Glucose-Capillary: 132 mg/dL — ABNORMAL HIGH (ref 70–99)
Glucose-Capillary: 133 mg/dL — ABNORMAL HIGH (ref 70–99)
Glucose-Capillary: 137 mg/dL — ABNORMAL HIGH (ref 70–99)
Glucose-Capillary: 137 mg/dL — ABNORMAL HIGH (ref 70–99)
Glucose-Capillary: 157 mg/dL — ABNORMAL HIGH (ref 70–99)
Glucose-Capillary: 246 mg/dL — ABNORMAL HIGH (ref 70–99)
Glucose-Capillary: 73 mg/dL (ref 70–99)
Glucose-Capillary: 80 mg/dL (ref 70–99)
Glucose-Capillary: 83 mg/dL (ref 70–99)
Glucose-Capillary: 83 mg/dL (ref 70–99)
Glucose-Capillary: 85 mg/dL (ref 70–99)
Glucose-Capillary: 88 mg/dL (ref 70–99)
Glucose-Capillary: 88 mg/dL (ref 70–99)
Glucose-Capillary: 90 mg/dL (ref 70–99)
Glucose-Capillary: 91 mg/dL (ref 70–99)
Glucose-Capillary: 92 mg/dL (ref 70–99)
Glucose-Capillary: 92 mg/dL (ref 70–99)
Glucose-Capillary: 95 mg/dL (ref 70–99)
Glucose-Capillary: 99 mg/dL (ref 70–99)

## 2010-06-07 LAB — CROSSMATCH
ABO/RH(D): O POS
Antibody Screen: NEGATIVE

## 2010-06-07 LAB — CBC
HCT: 25.6 % — ABNORMAL LOW (ref 39.0–52.0)
HCT: 26 % — ABNORMAL LOW (ref 39.0–52.0)
HCT: 26.7 % — ABNORMAL LOW (ref 39.0–52.0)
HCT: 26.8 % — ABNORMAL LOW (ref 39.0–52.0)
HCT: 27.3 % — ABNORMAL LOW (ref 39.0–52.0)
HCT: 30.3 % — ABNORMAL LOW (ref 39.0–52.0)
Hemoglobin: 10.3 g/dL — ABNORMAL LOW (ref 13.0–17.0)
Hemoglobin: 8.7 g/dL — ABNORMAL LOW (ref 13.0–17.0)
Hemoglobin: 9.1 g/dL — ABNORMAL LOW (ref 13.0–17.0)
Hemoglobin: 9.3 g/dL — ABNORMAL LOW (ref 13.0–17.0)
Hemoglobin: 9.3 g/dL — ABNORMAL LOW (ref 13.0–17.0)
Hemoglobin: 9.4 g/dL — ABNORMAL LOW (ref 13.0–17.0)
MCHC: 33.9 g/dL (ref 30.0–36.0)
MCHC: 34.6 g/dL (ref 30.0–36.0)
MCHC: 34.8 g/dL (ref 30.0–36.0)
MCHC: 35.1 g/dL (ref 30.0–36.0)
MCV: 89.9 fL (ref 78.0–100.0)
MCV: 91.1 fL (ref 78.0–100.0)
MCV: 91.1 fL (ref 78.0–100.0)
MCV: 91.3 fL (ref 78.0–100.0)
MCV: 91.9 fL (ref 78.0–100.0)
Platelets: 170 10*3/uL (ref 150–400)
Platelets: 171 10*3/uL (ref 150–400)
Platelets: 173 10*3/uL (ref 150–400)
Platelets: 192 10*3/uL (ref 150–400)
RBC: 2.85 MIL/uL — ABNORMAL LOW (ref 4.22–5.81)
RBC: 2.85 MIL/uL — ABNORMAL LOW (ref 4.22–5.81)
RBC: 2.93 MIL/uL — ABNORMAL LOW (ref 4.22–5.81)
RBC: 2.93 MIL/uL — ABNORMAL LOW (ref 4.22–5.81)
RBC: 3.08 MIL/uL — ABNORMAL LOW (ref 4.22–5.81)
RDW: 14.1 % (ref 11.5–15.5)
RDW: 14.3 % (ref 11.5–15.5)
RDW: 15.6 % — ABNORMAL HIGH (ref 11.5–15.5)
WBC: 3.7 10*3/uL — ABNORMAL LOW (ref 4.0–10.5)
WBC: 4.3 10*3/uL (ref 4.0–10.5)
WBC: 4.4 10*3/uL (ref 4.0–10.5)
WBC: 5.4 10*3/uL (ref 4.0–10.5)
WBC: 5.5 10*3/uL (ref 4.0–10.5)

## 2010-06-07 LAB — BASIC METABOLIC PANEL WITH GFR
BUN: 28 mg/dL — ABNORMAL HIGH (ref 6–23)
CO2: 25 meq/L (ref 19–32)
Calcium: 8.7 mg/dL (ref 8.4–10.5)
Chloride: 113 meq/L — ABNORMAL HIGH (ref 96–112)
Creatinine, Ser: 1.81 mg/dL — ABNORMAL HIGH (ref 0.4–1.5)
GFR calc non Af Amer: 37 mL/min — ABNORMAL LOW
Glucose, Bld: 81 mg/dL (ref 70–99)
Potassium: 4.3 meq/L (ref 3.5–5.1)
Sodium: 141 meq/L (ref 135–145)

## 2010-06-07 LAB — TSH: TSH: 4.151 u[IU]/mL (ref 0.350–4.500)

## 2010-06-07 LAB — TRANSFERRIN: Transferrin: 186 mg/dL — ABNORMAL LOW (ref 212–360)

## 2010-06-07 LAB — HEPARIN LEVEL (UNFRACTIONATED): Heparin Unfractionated: <0.1 IU/mL — ABNORMAL LOW (ref 0.30–0.70)

## 2010-06-07 LAB — CULTURE, ROUTINE-ABSCESS

## 2010-06-07 LAB — ANAEROBIC CULTURE

## 2010-06-07 LAB — IRON AND TIBC
Iron: 30 ug/dL — ABNORMAL LOW (ref 42–135)
Saturation Ratios: 13 % — ABNORMAL LOW (ref 20–55)
TIBC: 223 ug/dL (ref 215–435)
UIBC: 193 ug/dL

## 2010-06-07 LAB — PHOSPHORUS
Phosphorus: 2.7 mg/dL (ref 2.3–4.6)
Phosphorus: 2.7 mg/dL (ref 2.3–4.6)

## 2010-06-07 LAB — CARDIAC PANEL(CRET KIN+CKTOT+MB+TROPI)
CK, MB: 1 ng/mL (ref 0.3–4.0)
CK, MB: 1 ng/mL (ref 0.3–4.0)
Relative Index: INVALID (ref 0.0–2.5)
Relative Index: INVALID (ref 0.0–2.5)
Total CK: 26 U/L (ref 7–232)
Total CK: 38 U/L (ref 7–232)

## 2010-06-07 LAB — MAGNESIUM
Magnesium: 1.8 mg/dL (ref 1.5–2.5)
Magnesium: 1.9 mg/dL (ref 1.5–2.5)
Magnesium: 1.9 mg/dL (ref 1.5–2.5)
Magnesium: 1.9 mg/dL (ref 1.5–2.5)

## 2010-06-07 LAB — PROTIME-INR
INR: 1.18 (ref 0.00–1.49)
Prothrombin Time: 14.9 s (ref 11.6–15.2)

## 2010-06-07 LAB — FERRITIN: Ferritin: 246 ng/mL (ref 22–322)

## 2010-06-08 LAB — CBC
HCT: 33.2 % — ABNORMAL LOW (ref 39.0–52.0)
MCV: 90.8 fL (ref 78.0–100.0)
Platelets: 185 10*3/uL (ref 150–400)
RDW: 14.4 % (ref 11.5–15.5)

## 2010-06-08 LAB — HEMOGLOBIN A1C
Hgb A1c MFr Bld: 6.2 % — ABNORMAL HIGH (ref 4.6–6.1)
Mean Plasma Glucose: 131 mg/dL

## 2010-06-08 LAB — URINE CULTURE: Colony Count: >=100000

## 2010-06-08 LAB — BASIC METABOLIC PANEL
BUN: 34 mg/dL — ABNORMAL HIGH (ref 6–23)
Creatinine, Ser: 1.89 mg/dL — ABNORMAL HIGH (ref 0.4–1.5)
GFR calc non Af Amer: 36 mL/min — ABNORMAL LOW (ref >60)
Glucose, Bld: 205 mg/dL — ABNORMAL HIGH (ref 70–99)
Potassium: 4.1 mEq/L (ref 3.5–5.1)

## 2010-06-08 LAB — URINALYSIS, ROUTINE W REFLEX MICROSCOPIC
Ketones, ur: NEGATIVE mg/dL
Nitrite: NEGATIVE
Protein, ur: 30 mg/dL — AB
Urobilinogen, UA: 1 mg/dL (ref 0.0–1.0)

## 2010-06-08 LAB — DIFFERENTIAL
Basophils Absolute: 0 10*3/uL (ref 0.0–0.1)
Eosinophils Absolute: 0.1 10*3/uL (ref 0.0–0.7)
Eosinophils Relative: 1 % (ref 0–5)
Lymphocytes Relative: 12 % (ref 12–46)
Neutrophils Relative %: 77 % (ref 43–77)

## 2010-06-08 LAB — CULTURE, BLOOD (ROUTINE X 2)
Culture: NO GROWTH
Culture: NO GROWTH

## 2010-06-08 LAB — TSH: TSH: 1.988 u[IU]/mL (ref 0.350–4.500)

## 2010-06-13 ENCOUNTER — Other Ambulatory Visit: Payer: Self-pay | Admitting: Family Medicine

## 2010-06-30 ENCOUNTER — Other Ambulatory Visit: Payer: Self-pay | Admitting: Family Medicine

## 2010-07-12 ENCOUNTER — Other Ambulatory Visit: Payer: Self-pay | Admitting: Family Medicine

## 2010-07-17 NOTE — Op Note (Signed)
[name], [name] NO.:  [id number]   MEDICAL RECORD NO.:  [id number]          PATIENT TYPE:  INP   LOCATION:  1507                         FACILITY:  [facility]   PHYSICIAN:  Charlynne Pander, D.D.S.DATE OF BIRTH:  [birth year]   DATE OF PROCEDURE:  10/01/2007  DATE OF DISCHARGE:                               OPERATIVE REPORT   PREOPERATIVE DIAGNOSES:  1. History of decubitus ulcer with abscess.  2. History of sepsis secondary to #1.  3. Chronic periodontitis.   POSTOPERATIVE DIAGNOSES:  1. History of decubitus ulcer with abscess.  2. History of sepsis secondary to #1.  3. Chronic periodontitis.   OPERATIONS:  1. Extraction of remaining teeth (tooth numbers 6, 7 and 8).  2. One quadrant of alveoloplasty.   SURGEON:  Charlynne Pander, D.D.S.   ASSISTANT:  Zettie Pho, (Sales executive).   ANESTHESIA:  Monitored anesthesia care per the Anesthesia Team.   MEDICATIONS:  Local anesthesia with a total utilization of two Carpules  each containing 34 mg of lidocaine with 0.017 mg of epinephrine.   SPECIMENS:  There were two teeth that were discarded.   CULTURES:  None.   DRAINS:  None.   COMPLICATIONS:  None.   ESTIMATED BLOOD LOSS:  Less than 10 mL.   FLUIDS:  200 mL of Lactated Ringer solution.   INDICATIONS:  The patient was admitted with a history of decubitus ulcer  and abscess formation.  The patient also suffered sepsis secondary to  the abscess.  The patient was placed on IV antibiotic therapy.  Dental  consultation was then requested to evaluate loose teeth.  The patient  was examined and treatment planned for extraction of remaining upper  teeth with alveoloplasty as indicated.  This treatment plan is  formulated to decrease the risk and complications associated with dental  infection from further affecting the patient's systemic health.   OPERATIVE FINDINGS:  The patient was examined in operating room #6.  The  teeth were  identified for extraction.  The patient noted be affected by  chronic periodontitis.  The aforementioned necessitated removal of all  remaining teeth with alveoloplasty as indicated.   DESCRIPTION OF PROCEDURE:  The patient was brought to the main operating  room #6.  The patient was then placed in the supine position on his  existing hospital bed.  Monitored anesthesia care was then induced per  the Anesthesia Team.  The patient was then prepped and draped in the  usual manner for a dental medicine procedure.  The oral cavity was then  thoroughly examined with findings noted above.  The patient was then  ready for the dental medicine procedure as follows:   Local anesthesia was administered with a total utilization of two  Carpules each containing 34 mg of lidocaine 0.017 mg of epinephrine.  This was via infiltration to the area of tooth #6 through 8.   The maxillary right quadrant was first approached.  Tooth #6, 7 and 8  were subluxated with a series of straight elevators.  Tooth #6, 7 and 8  were then removed with a 150 forceps  without complications.  A 15 blade  incision was then made from the mesial #8 and extended to the distal of  #5.  A surgical flap was then carefully reflected.  Alveoplasty was then  performed utilizing rongeurs and bone file.  The surgical site was then  irrigated with copious amounts of sterile saline.  A piece of Surgicel  was placed in each extraction socket appropriately.  The tissues were  then approximated and trimmed appropriately.  The surgical site was then  closed from distal #5 to the mesial of #8 utilizing 3-0 chromic gut  suture in a continuous interrupted suture technique x1.  Three  individual interrupted sutures were then placed to further close the  surgical site.  A series of 4x4 gauze were placed in the mouth to aid  hemostasis.  The patient was then handed back over to the Anesthesia  Team for final disposition.  After an appropriate  amount of time, the  patient was taken to the post anesthesia care unit with stable vital  signs and good oxygenation level.  All counts were correct for the  dental medicine procedure.  The patient will be seen in approximately 7-  10 days for evaluation for suture removal.  The patient will then follow-  up with a dentist of his choice for fabrication of upper and lower  complete dentures.      Charlynne Pander, D.D.S.  Electronically Signed     RFK/MEDQ  D:  10/01/2007  T:  10/01/2007  Job:  [id number]   cc:   [facility] Hospitalist Team

## 2010-07-17 NOTE — Discharge Summary (Signed)
NAME:  [name], [name] NO.:  [id number]   MEDICAL RECORD NO.:  [id number]          PATIENT TYPE:  INP   LOCATION:  1411                         FACILITY:  [facility]   PHYSICIAN:  Raenette Rover. Felicity Coyer, MDDATE OF BIRTH:  Dec 01, 1940   DATE OF ADMISSION:  12/15/2007  DATE OF DISCHARGE:  12/23/2007                               DISCHARGE SUMMARY   PRIMARY CARE PHYSICIAN:  Lelon Perla, DO   DISCHARGE DIAGNOSES:  1. Proteus mirabilis sepsis secondary to urinary tract infection with      septic shock on admission.  2. Diarrhea with presumed toxin negative Clostridium difficile      relapse.  3. Gout.  4. Type 2 diabetes   HISTORY OF PRESENT ILLNESS:  Mr. [name] is 70 year old white male with  multiple medical problems including multiple admissions in July 2009 for  incision and drainage of decubitus ulcer with subsequent sepsis.  The  patient was discharged from [facility] back to his home  on October 7 after prolonged treatment for decubitus ulcer and C. diff.  Wife called EMS on day of admission with reports of significant weakness  since arriving home from nursing home facility with onset of confusion  and hypotension noted by home health nurse on morning of admission.  Wife also stated at time of admission that Foley catheter was not  draining at home.  Therefore, was changed on day of admission by home  health nurse and began draining purulent yellow pus.  The patient's wife  also reported several loose stools since being discharged from the  hospital on October 02, 2007.  Upon evaluation in the emergency room, the  patient found to be quite hypotensive with systolic blood pressure in  the  80s and heart rate in the 140s not responding very well to IV  fluids.  The patient also found to have a white cell count of 16.2,  creatinine of 5.19.  Urinalysis revealed white blood cells too numerous  to count.  The patient was admitted at that time for  septic shock  thought presumably secondary to urinary tract infection.  Pulmonary  critical care medicine was called at time of admission for placement of  central line and septic shock protocol.   PAST MEDICAL HISTORY:  1. Type 2 diabetes.  2. 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  3. Decubitus ulcer status post I and D July 2009 per Dr. Avel Peace.  4. History of sepsis secondary to problem #04 September 2007.  5. Depression.  6. Hypothyroidism.  7. Gout.  8. Chronic anemia.  9. History of C. diff colitis with hospitalization in July 2009.  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  11.History of acute renal insufficiency requiring hemodialysis in May      2009.  12.Morbid obesity.  13.Indwelling Foley catheter secondary to chronic immobility.   COURSE OF HOSPITALIZATION:  Problem #1:  Proteus mirabilis septic shock  secondary to UTI.  Again, critical care was asked to see the patient in  consultation at time of admission for placement of central line.  The  patient  was admitted from the emergency room to the intensive care unit  and placed on IV pressor therapy.  The patient was also placed on  empiric vancomycin and imipenem pending cultures.  Urine culture did  grow greater than 100,000 colonies of Proteus mirabilis sensitive to  ceftriaxone.  At this time the patient has transitioned to telemetry  floor.  The patient's vital signs have remained stable off IV pressors  greater than 72 hours.  The patient to continue Rocephin for a total of  10 days' treatment.  Problem #2:  Diarrhea presumed toxin negative C. diff reoccurrence.  As  mentioned above, the patient was hospitalized in July 2009 for C. diff.  Wife reported at time of admission the diarrhea had not slowed since  that admission with continued loose stools several times daily.  C. diff  samples sent during this admission were negative.  However due to the  patient's persistent diarrhea, GI was asked to see the patient in   consultation.  At this time the patient will be treated with p.o.  vancomycin taper dose as listed below as well as Florastor, Questran.  The patient's diarrhea has greatly improved at the time of admission.  Problem %3:  Gout.  The patient complaining of acute gout exacerbation  to the left foot.  No erythema or deformity noted on exam.  However, the  patient's symptoms are improved with prednisone.  Therefore, we will  continue short prednisone taper at the time of discharge.  We will avoid  colchicine in setting of problem #2.  Problem#4.  Type 2 diabetes.  Upon discharge from skilled nursing  facility just prior to this admission, the patient was not on any  diabetic medications, A1c obtained in July 2009 was 5.8.  The patient's  blood sugars have been well-maintained during hospitalization with low-  dose Lantus as well as sliding scale insulin.  We will discharge the  patient home on low-dose Lantus as he has been on this medication in the  past and it is very important to control the patient's blood sugars in  setting of multiple medical problems.  The patient will need followup  with primary care physician regarding titration of insulin regimen.  Problem #5:  Chronic debilitation secondary to BKA and multiple medical  problems.  The patient and family do not wish for skilled nursing  facility placement at this time.  The patient to return home with home  health nursing as well as home health physical and occupational therapy.   MEDICATIONS:  At time of discharge:  1. Ceftin 250 mg p.o. b.i.d. until gone.  2. Vancomycin 125 mg tablet 250 mg 4 times daily until January 06, 2008, then 125 mg 3 times daily until January 13, 2008, then 125      mg  b.i.d. until January 20, 2008, then 125 mg p.o. daily until      January 27, 2008, then stop.  3. Florastor 250 mg p.o. b.i.d. until January 23, 2008, then stop.  4. Questran 4 grams daily until December 30, 2007, then stop.  5.  Prednisone 10 mg tablets as directed.  6. Lantus insulin 5 units subcutaneous daily.  7. Allopurinol 150 mg p.o. daily.  8. Aspirin 81 mg p.o. daily.  9. Centrum multivitamin p.o. daily.  10.Prozac 40 p.o. daily.  11.Zinc 220 mg p.o. daily.  12.Vitamin C daily.  13.Synthroid 150 mcg p.o. daily.  14.Prilosec 20 mg p.o. daily.  15.Ambien 10  mg p.o. nightly p.r.n. insomnia.  16.Oxycodone 5 mg 1 tablet p.o. every 6 hours p.r.n. pain.  17.Neurontin 300 mg p.o. daily.   LABORATORY WORK AT TIME OF DISCHARGE:  White cell count 10.5, platelet  count 177, hemoglobin 9.3, hematocrit 28.2.  Sodium 136, potassium 3.6,  BUN 16, creatinine 1.14.  Blood cultures obtained on December 15, 2007  are negative for any growth.  C. diff toxin is negative x3.  TSH 2.355.   DISPOSITION:  The patient felt medically stable for discharge home at  this time with continued home health nursing, and physical and  occupational therapy.  Again, patient with multiple comorbidities that  will need to be followed closely outpatient.  The patient is instructed  to call his primary care physician's office for appointment in 1-2 weeks  after discharge.  Appointment not scheduled at this time as wife needs  to arrange schedule to get patient to the office.   Greater than 30 minutes spent on discharge planning.      Cordelia Pen, NP      Raenette Rover. Felicity Coyer, MD  Electronically Signed    LE/MEDQ  D:  12/23/2007  T:  12/23/2007  Job:  [id number]   cc:   Lelon Perla, DO  [address] [place], [place] [address]

## 2010-07-17 NOTE — Op Note (Signed)
NAME:  [name], [name] NO.:  [id number]   MEDICAL RECORD NO.:  [id number]          PATIENT TYPE:  INP   LOCATION:  1507                         FACILITY:  [facility]   PHYSICIAN:  Adolph Pollack, M.D.DATE OF BIRTH:  December 26, 1940   DATE OF PROCEDURE:  09/24/2007  DATE OF DISCHARGE:                               OPERATIVE REPORT   PREOPERATIVE DIAGNOSIS:  Infected left buttock sacral decubitus ulcer.   POSTOPERATIVE DIAGNOSIS:  Infected left buttock sacral decubitus ulcer.   PROCEDURE:  Incision, drainage and debridement of infected left buttock  decubitus ulcer.   SURGEON:  Adolph Pollack, M.D.   ANESTHESIA:  Local.   TECHNIQUE:  The patient was placed in the right lateral decubitus  position and the left buttock area was sterilely prepped.  He was  anesthetized with 1% lidocaine.  I then began a sharp debridement of  overlying skin over a deep abscess cavity and opened it up so the wound  could be exposed.  No further purulent drainage was noted.  I had to do  minimal debridement of subcutaneous tissues as most of this appeared  viable.  I then packed the wound tightly with Kerlix gauze for  hemostatic effect and applied a bulky dressing.  He tolerated the  procedure well.      Adolph Pollack, M.D.  Electronically Signed     TJR/MEDQ  D:  09/24/2007  T:  09/24/2007  Job:  [id number]

## 2010-07-17 NOTE — Discharge Summary (Signed)
[name], [name] NO.:  [id number]   MEDICAL RECORD NO.:  [id number]          PATIENT TYPE:  INP   LOCATION:  1507                         FACILITY:  [facility]   PHYSICIAN:  [name], MD    DATE OF BIRTH:  [birth year]   DATE OF ADMISSION:  09/22/2007  DATE OF DISCHARGE:  10/02/2007                               DISCHARGE SUMMARY   FINAL DISCHARGE DIAGNOSES:  Include:  1. Infected sacral decubitus with left gluteal abscess.  2. Chronic, periodontitis.  3..  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  1. Hypothyroidism.  2. Gout with acute flare in the hospital.  3. Depression.  4. Anemia of chronic disease.  5. Clostridium difficile colitis.   DISCHARGE MEDICATIONS:  Include:  1. Primaxin 500 mg IV q.6 h. for total of 6 weeks, starting date was      September 26, 2007.  2. Vancomycin 1250 mg IV q.24 h. for 6 weeks, starting date is September 26, 2007.  3. Aspirin 81 mg p.o. once daily.  4. Colchicine 0.6 mg p.o. once daily.  5. Colace 100 mg p.o. once daily.  6. Prozac 40 mg p.o. once daily.  7. Neurontin 100 mg p.o. at bedtime.  8. Lantus 30 units subcu at bedtime.  9. Synthroid 150 mcg p.o. once daily.  10.Oxycodone 5 mg p.o. q.4 h. as needed.  11.Protonix 40 mg p.o. once daily.  12.Renagel 1600 mg p.o. 3 times daily.  13.MiraLax 17 grams with water once daily as needed.  14.Sliding scale insulin per nursing home protocol.  15.Vancomycin 125 mg p.o. q.6 h. for 2 weeks and starting date is September 29, 2007.   CONSULTATIONS:  Since the last admission there is dental consultation  done on September 29, 2007.   PROCEDURES SINCE LAST ADMISSION:  The patient underwent extraction of  teeth #6, 7 and 8 and 1 quadrant of alveoloplasty.   RADIOLOGY STUDIES:  Since last dictation include chest x-ray on September 28, 2007 showed right upper extremity PICC line.   COURSE OF HOSPITALIZATION:  Course of hospitalization from admission up  to September 27, 2007  was dictated in the previous discharge summary that was  done on September 27, 2007  by Dr. Isidor Holts.  Since that time, the  patient has been stable.  All listed medical problems have been managed  in the same way.  The patient was continued on Primaxin and Primaxin and  vancomycin for treatment of his decubitus ulcer.  Recommendation by ID  and surgery is to continue that for 6 weeks.  The starting date is September 26, 2007.  The patient had PIC line placed for those antibiotics.   The patient was found to have C. diff colitis.  He was put in contact  isolation for that.  Recommendation by ID is to give vancomycin p.o. and  to be taken for total of 2 weeks.  Start date of the vancomycin is September 29, 2007.   Dental consultation was done.  The patient is diagnosed with  chronic  periodontitis.  The patient's heparin was stopped on October 01, 2007.  He  was taken to the OR for extraction of teeth 6, 7, and 8 on October 01, 2007  and he had no complications following his procedure.  The patient will  follow with his dentist as an outpatient sometime next week.   FOLLOWUP:  1. Primary care physician.  2. Cindra Eves, D.D.S. next week.   Otherwise, other medical conditions are stable at this time.   NURSING INSTRUCTIONS:  Dressing change daily.   TOTAL ASSESSMENT TIME:  40 minutes.      [name], MD  Electronically Signed     NAE/MEDQ  D:  10/02/2007  T:  10/02/2007  Job:  [id number]

## 2010-07-17 NOTE — Consult Note (Signed)
NAMELANG, ZINGG NO.:  [id number]   MEDICAL RECORD NO.:  [id number]          PATIENT TYPE:  INP   LOCATION:  1507                         FACILITY:  [facility]   PHYSICIAN:  Adolph Pollack, M.D.DATE OF BIRTH:  November 12, 1940   DATE OF CONSULTATION:  09/24/2007  DATE OF DISCHARGE:                                 CONSULTATION   REQUESTING PHYSICIAN:  Isidor Holts, M.D.   REASON FOR CONSULTATION:  Infected left buttock decubitus ulcer.   HISTORY OF PRESENT ILLNESS:  This is a 70 year old male who has been in  rehab following a right below-knee amputation in May 2009.  He has had  problems with developing decubitus ulcers.  He was admitted with a fever  of 104.2 September 22, 2007 and found to have infected decubitus ulcers.  He  was started on broad-spectrum antibiotics and wound care.  Wound care  nurse was examining his left buttock this morning and noted that there  was a small wound.  When she probed it, a large amount of brown purulent  material was evacuated and it was tunneling.  She recommended that the  wound be enlarged to facilitate drainage and thus I was called to see  him.  He does have neuropathy and has some feeling down there.   PAST MEDICAL HISTORY:  1. Type 2 diabetes mellitus.  2. Right lower extremity gangrene status post right BKA  3. Depression.  4. Hypothyroidism.  5. Gout.  6. Peripheral neuropathy.  7. Anemia.  8. Decubitus ulcers.   CURRENT MEDICATIONS:  1. Neurontin.  2. Colace.  3. MiraLax.  4. Synthroid.  5. Prozac.  6. Aspirin.  7. Protonix.  8. Lovenox.  9. Insulin sliding scale.  10.Vancomycin.  11.Darvocet p.r.n. pain.  12.Envance.  13.Lantus insulin.  14.Prednisone which is being tapered off tomorrow.  15.Lactulose.  16.Other p.r.n. medicines.   ALLERGIES:  NO KNOWN DRUG ALLERGIES.   SOCIAL HISTORY:  He is currently in a skilled nursing facility.  No  tobacco or alcohol use.   PHYSICAL EXAMINATION:  GENERAL:   An obese male who is no acute distress,  pleasant and cooperative.  VITAL SIGNS:  Temperature is 97.3, blood pressure 101/54, pulse 86.  MUSCULOSKELETAL:  In the buttock area, there is a right-sided open wound  that is clean with good granulation tissue.  On the left side, there is  a wound that is partially opened, minimal cellulitis.  It does have  undermining superiorly and medially.   LABORATORY DATA:  Cultures demonstrate Klebsiella from the wound as well  as gram positive cocci.  White cell count 7500, hemoglobin 11.   IMPRESSION:  Incompletely drained infected left buttock decubitus ulcer.   PLAN:  We will incise, debride and open up the wound further so better  wound care can be performed.  We will then start dressing changes and  wound care on September 25, 2007.      Adolph Pollack, M.D.  Electronically Signed     TJR/MEDQ  D:  09/24/2007  T:  09/24/2007  Job:  [id number]

## 2010-07-17 NOTE — H&P (Signed)
NAME:  [name], [name] NO.:  [id number]   MEDICAL RECORD NO.:  [id number]          PATIENT TYPE:  EMS   LOCATION:  ED                           FACILITY[facility]   PHYSICIAN:  Vania Rea, M.D. DATE OF BIRTH:  November 26, 1940   DATE OF ADMISSION:  09/22/2007  DATE OF DISCHARGE:                              HISTORY & PHYSICAL   PRIMARY CARE PHYSICIAN:  Unassigned.   CHIEF COMPLAINT:  Fever and chills since yesterday.   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  Since that amputation, which was  associated with complications his diabetes, the patient has had impaired  mobility and has developed decubitus ulcers of the buttocks.  These have  been difficult to heal and have required debridement.  However, because  the patient is also incontinent of urine and wears diapers, healing has  been difficult.  The patient's mobility is usually by [facility] lift into a  wheelchair, although, he is also getting physical therapy to learn to  use an artificial leg.  In any event, the patient developed fever and  chills yesterday and temperature of 104.2 as recorded, and he was  brought to the emergency room.  He was evaluated and the hospitalist  service was called to assist with management.   The patient denies fever, cough or cold.  He denies dysuria, nausea or  vomiting.  He reports that the decubitus ulcers are painful.   PAST MEDICAL HISTORY:  1. Diabetes type 2.  2. 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  3. Depression.  4. Hypothyroidism.  5. Gout.  6. Peripheral neuropathy.  7. Anemia.   MEDICATIONS:  1. Protonix 40 mg daily.  2. Neurontin 100 mg at bedtime.  3. Proscar 40 mg daily.  4. Colace 100 mg daily.  5. Renagel 1600 mg t.i.d.  6. MiraLax 17 g with water daily.  7. Ambien 5 mg at  bedtime.  8. Lantus 40 units at bedtime.  9. Lactulose 30 mg every four hours p.r.n. constipation.  10.Synthroid 150 mcg daily.  11.Aspirin 81 mg daily.  12.Mentax one tablet b.i.d.  13.Vicodin 5/500 p.r.n.  14.Dulcolax 10 g rectally for constipation.  15.Sliding scale insulin.  16.Prozac 40 mg each morning.   ALLERGIES:  No known drug allergies.   SOCIAL HISTORY:  Currently a resident at the skilled nursing facility  where he is getting rehab and wound care.  Remote history of tobacco  use.  Currently denies tobacco, alcohol or illicit drug use.   FAMILY HISTORY:  Denied any family history of diabetes, hypertension or  coronary artery disease.   REVIEW OF SYSTEMS:  Other than noted above, a 10-point review of systems  unremarkable.   PHYSICAL EXAMINATION:  GENERAL:  Morbidly obese, elderly Caucasian  gentleman lying on the stretcher.  Does not appear to be in distress at  this time.  VITAL SIGNS:  Temperature 100.3, pulse  88, respirations 90, blood  pressure 104/54.  HEENT:  His pupils are round and equal.  Mucous membranes moist.  Nonicteric.  dehydrated.  NECK:  No jugular venous distention. no thyromegaly.  CHEST:  Clear to auscultation bilaterally.  CARDIOVASCULAR:  Regular rate and rhythm.  ABDOMEN:  Obese, soft.  non-tender.  EXTREMITIES:  Legs are without edema.  Toes are warm.  Dorsalis pedis  1+.  Right lower extremity status post BKA.  The wound is somewhat  reddish, but appears to be healed.  There is no drainage.  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  They are pink and look clean, but there is a foul  smell emanating.  CNS:  Cranial nerves II-XII are grossly intact.  Has no lateralizing  sign.   LABORATORY DATA:  Hemoglobin 10.4, white count unremarkable.  CBC is  otherwise unremarkable.  White count 5.7, platelets 217.  Sodium 136,   potassium 3.2, chloride 100, CO2 24, BUN 10, creatinine 1.1.  Glucose  162.  Urinalysis shows turbid urine specific gravity of 1.017.  Small  amount of blood, 30 mg/dl protein, negative for nitrites, large amounts  of leukocyte esterase.  Microscopy has too numerous to count white  cells.  Chest x-ray shows no acute abnormalities.   ASSESSMENT:  1. Sepsis in an immunocompromised host.  2. Urinary tract infection.  3. Infected decubitus ulcers.  4. Hypokalemia.  5. Anemia.   PLAN:  Admit this gentleman for IV antibiotics, fluids, keep his wound  clean.  Will need referral to wound care and will also replete his  potassium.  Other plans as per orders.      Vania Rea, M.D.  Electronically Signed     LC/MEDQ  D:  09/22/2007  T:  09/22/2007  Job:  [id number]   cc:   Maxwell Caul, M.D.

## 2010-07-17 NOTE — Discharge Summary (Signed)
NAME:  [name], [name] NO.:  [id number]   MEDICAL RECORD NO.:  [id number]          PATIENT TYPE:  INP   LOCATION:  5024                         FACILITY:  [facility]   PHYSICIAN:  Nadara Mustard, MD     DATE OF BIRTH:  01-05-1941   DATE OF ADMISSION:  07/08/2007  DATE OF DISCHARGE:  08/03/2007                               DISCHARGE SUMMARY   DIAGNOSES:  1. Gangrene, right foot.  2. Acute renal failure.  3. Diabetes.  4. Decubitus sacral ulcer.  5. Morbid obesity.   DISCHARGE MEDICATIONS:  Include:  1. Synthroid/Levothroid 150 mcg p.o. daily.  2. Aspirin 81 mg p.o. daily.  3. Metanx 1 p.o. b.i.d.  4. Protonix 40 mg p.o. daily.  5. NovoLog sliding scale insulin with meals.  6. CBZ 101-150 three units, 151-200 four units, 201-250 seven units,      251-300 nine units, 301-350 twelve units, and 351-400 fifteen      units.  7. Neurontin 100 mg p.o. nightly.  8. Beta-protein powder one scoop t.i.d. with meals.  9. Prozac 40 mg p.o. daily.  10.Nystatin cream, apply to groin t.i.d.  11.Diflucan 100 mg p.o. daily.  12.Colace 100 mg p.o. daily.  13.Renagel 1600 mg p.o. t.i.d.  14.Aranesp 100 mcg subcu Monday.  15.MiraLax 17 gram powder b.i.d.  16.Lantus insulin 40 mg subcu nightly.  17.Colchicine 0.6 mg p.o. daily.  18.Renal vitamin p.o. b.i.d.  19.Vicodin 1 p.o. q.4 h. p.r.n. for pain.  20.Ativan 0.5 mg p.o. q.8 h. p.r.n. nerves.  21.Lactulose 30 mL p.o. q.4 h. p.r.n.  22.Dulcolax 10 mg p.o. p.r.n.  23.Tylenol 650 mg p.o. q.8 h. p.r.n.  24.Ambien 5 mg p.o. nightly p.r.n. sleep.   HISTORY OF PRESENT ILLNESS:  The patient is a 70 year old gentleman who  was admitted for gangrene of the right foot and underwent a transtibial  amputation.  He has morbid obesity and type 2 diabetes.  The patient  progressed well initially postoperatively; however, on hospital day 4,  the patient was noted to be hypoxic and saw me on Jul 12, 2007.  The  patient was hypotensive and  decreased O2 sats.  Narcan was given, the  patient improved.  The patient was placed on BiPAP; however, he  continued to deteriorate and he continued to have deteriorating  creatinine levels.  The patient was placed in the unit and was placed on  a ventilator, was started on dialysis, and was monitored closely in the  ICU.  The patient did develop a decubitus ulcer and this was treated by  wound care.  The patient had slow recovery of his renal functions, and  at the time of discharge, the patient had rapid improvement of his BUN  and creatinine and rapid improvement of his kidney function with a  creatinine decreased down to 4.4.  The patient was alert and oriented.  His incision was healing nicely; however, he did have some small areas  of ischemic changes around the wound edges.  This was less than a  centimeter in diameter.  This was to be expected due to the patient's  prolonged hypoxic episode.  The patient was kept on a KinAir bed and  underwent decubitus ulcer dressing changes and will need to continue  decubitus dressing ulcer changes on his sacrum with turning side-to-side  every 2 hours.  Physical therapy, progressive ambulation,  nonweightbearing on the right, and compressive dry dressing changes to  the right transtibial amputation daily.   FOLLOWUP:  Follow up with Dr. Lajoyce Corners in 3 weeks.  Follow up with  Nephrology in 2 weeks.      Nadara Mustard, MD  Electronically Signed     MVD/MEDQ  D:  08/03/2007  T:  08/03/2007  Job:  [id number]

## 2010-07-17 NOTE — Discharge Summary (Signed)
NAME:  [name], [name]              ACCOUNT NO.:  [id number]   MEDICAL RECORD NO.:  [id number]          PATIENT TYPE:  INP   LOCATION:  5526                         FACILITY:  [facility]   PHYSICIAN:  Mobolaji B. Bakare, M.D.DATE OF BIRTH:  [birth year]   DATE OF ADMISSION:  08/13/2007  DATE OF DISCHARGE:                               DISCHARGE SUMMARY   INTERIM DISCHARGE SUMMARY   PRIMARY CARE PHYSICIAN:  Unassigned.   DATE OF DISCHARGE:  Pending.   FINAL DIAGNOSES:  1. Bilateral gluteal decubitus with right gluteal abscess, status post      debridement.  2. Infected right below-knee amputation, status post revision.  3. Acute-on-chronic renal failure.  4. Morbid obesity.  5. Normocytic anemia, status post 2 units of packed red blood cells.  6. Diabetes mellitus, uncontrolled.  7. Hyponatremia, resolved.  8. Constipation.   SECONDARY DIAGNOSES:  1. Peripheral neuropathy.  2. History of gangrene, right foot, status post below-knee amputation.  3. Hypothyroidism.  4. History of acute renal failure in May 2009.   PROCEDURES:  1. Surgical debridement of bilateral gluteal decubitus and drainage of      right buttock abscess.  2. Revision of right below-knee amputation.   BRIEF HISTORY:  Please refer to the admission H&P for full details.   HOSPITAL COURSE:  1. Bilateral gluteal decubitus and right gluteal abscess.  The patient      was admitted with foul-smelling offensive discharge from right      gluteal decubitus.  He apparently had a deep-seated abscess.  He      was taken to the operating room, and this was debrided by Dr.      Ezzard Standing.  Cultures were sent, and this is growing E. coli, sensitive      to Zosyn and cefazolin but resistant to Unasyn.  The patient was      initially started on vancomycin and Zosyn.  The vancomycin has been      discontinued and the antibiotic has been transitioned to Ceftin.      He is currently undergoing hydrotherapy to the wound.  Wound is    improving.  2. Right below-knee amputation stump infection.  He underwent revision      by Dr. Lajoyce Corners.  Further management per orthopedics.  3. Acute-on-chronic renal failure.  The patient has a history of acute      renal failure during his most recent hospitalization in May.  He      received dialysis at that time.  His renal function improved.  He      was admitted with a BUN and creatinine of 34/1.84 on August 13, 2007.      This remained stable until the 16th of June, 2009, whereby BUN was      31 and creatinine was 3.53.  Urinalysis:  Urine sodium and      creatinine have been requested.  Noted is that the patient was      hypotensive during the surgical procedure on August 15, 2007.  He      required phenylephrine during the procedure.  Nephrology will be  consulted.  4. Diabetes mellitus.  Blood glucose control has been suboptimal      during the course of hospitalization due to n.p.o. status and also      infection.  We have optimized treatment at this time by increasing      his Lantus insulin.  CBG control is improving.  5. Normocytic anemia.  The patient was admitted with a hemoglobin and      hematocrit of 9.4 and 27.1.  This has been relatively stable since      discharge in May with a hemoglobin of 8.8 and 26.1.  Hemoglobin      dropped to 7.7 on August 17, 2007.  He required 2 units of packed red      cells.  Hemoglobin has remained stable post transfusion.   An addendum will be made to this dictation at the time of discharge.      Mobolaji B. Corky Downs, M.D.  Electronically Signed     MBB/MEDQ  D:  08/18/2007  T:  08/18/2007  Job:  [id number]

## 2010-07-17 NOTE — Consult Note (Signed)
NAME:  [name], [name] NO.:  [id number]   MEDICAL RECORD NO.:  [id number]          PATIENT TYPE:  INP   LOCATION:  1507                         FACILITY:  [facility]   PHYSICIAN:  Charlynne Pander, D.D.S.DATE OF BIRTH:  06-04-1940   DATE OF CONSULTATION:  09/29/2007  DATE OF DISCHARGE:                                 CONSULTATION   [name], Isenberg., is a 70 year old male referred by Dr. Ricke Hey for  a dental consultation.  The patient was recently admitted with a history  of infected decubitus ulcer and gluteal abscess with sepsis secondary to  the ulceration/abscess.  Dental consultation was requested to evaluate  loose teeth.   MEDICAL HISTORY:  1. Infected sacral decubiti and left gluteal abscess.      a.     Patient currently on IV antibiotic therapy and followed by       infectious disease.  2. Sepsis secondary to #1.  3. Diabetes mellitus - type 2.  4. Peripheral neuropathy secondary to diabetes mellitus.  5. Hypothyroidism.  6. History of gout.  7. History of depression.  8. Anemia.  9. History of gangrene involving the right lower extremity, status      post right below-knee amputation in May 2009.  10.Current Clostridium difficile colitis with treatment with oral      vancomycin per infectious disease.   ALLERGIES:  NONE KNOWN.   MEDICATIONS:  1. Aspirin 81 mg daily.  2. Colchicine 0.6 mg daily.  3. Colace 100 mg daily.  4. Prozac 40 mg daily.  5. Neurontin 100 mg at bedtime.  6. Heparin 5000 units every 8 hours.  7. Ibuprofen 400 mg three times daily after meals.  8. Novolin insulin per sliding scale.  9. Lantus insulin 30 units at bedtime.  10.Levothyroxine 150 mcg daily.  11.Protonix 40 mg daily.  12.Darvocet-N 100 three times daily.  13.Vancomycin orally 125 mg three times daily.  14.IV vancomycin 1250 mg every 24 hours.  15.Primaxin 500 mg IV every 6 hours.   SOCIAL HISTORY:  The patient is married and had previously been  residing  in a skilled nursing home facility for rehabilitation following the  right below-knee amputation.  History of previous tobacco use - remote.  No current alcohol intake.Marland Kitchen   FAMILY HISTORY:  Noncontributory.   FUNCTIONAL ASSESSMENT:  Patient with a history of being dependent for  multiple ADLs.   REVIEW OF SYSTEMS:  This is reviewed from the chart and health history  assessment form for this admission.   DENTAL HISTORY:   CHIEF COMPLAINT:  Dental consultation requested to evaluate loose  teeth.   HISTORY OF PRESENT ILLNESS:  Patient with a history of several loose  teeth.  These are remaining in the maxillary arch.  The patient has not  been complaining of pain associated with this, but knows that the need  to be pulled.  The patient does have a history of upper partial  denture and a lower complete denture, which was lost by [facility].  The  patient wife indicates that she did contact risk management, who had  indicated  that they would pay for a new set of dentures.  The patient  indicates that the upper partial denture and lower complete denture were  fabricated approximately 10 years ago.   DENTAL EXAMINATION:  GENERAL:  The patient is a well-developed, well-  nourished male in no acute distress.  VITAL SIGNS:  Blood pressure is 126/72, pulse rate is 74, respirations  21, temperature 99.2.  HEAD/NECK:  There is no palpable lymphadenopathy.  There are no acute  TMJ symptoms.  INTRAORAL EXAM:  Patient with normal saliva.  Patient with significant  atrophy of the lower alveolar ridges, as well as remaining maxillary  alveolar ridges.  There is no evidence of abscess formation within the  mouth.  DENTITION:  The patient is missing all teeth with the exception of tooth  numbers 6, 7 and 8.  PERIODONTAL:  Patient with chronic advanced periodontal disease  involving remaining lower teeth.  There is generalized gingival  recession, generalized tooth mobility and moderate to  severe bone loss.  DENTAL CARIES:  There does appear to be dental caries affecting the  remaining teeth.  ENDODONTIC:  The patient currently denies acute pulpitis symptoms.  There is no evidence of abscess formation involving the remaining upper  teeth.  CROWN OR BRIDGE:  There are no crown or bridge restorations.  PROSTHODONTIC:  Patient with a history of upper partial denture and  lower complete denture, which was lost by [facility].  The patient will most  likely need a new set of upper and lower complete dentures after  adequate healing and once medically stable.   RADIOGRAPHIC INTERPRETATION:  A panoramic x-ray was obtained by the  department of radiology today.   There are multiple missing teeth with the exception of tooth #6, 7 and  8.  There is atrophy of the edentulous alveolar ridges.  There is  moderate to severe bone loss associated with remaining upper teeth.  There is no evidence of periapical pathology.   ASSESSMENT:  1. Remaining teeth numbers 6, 7 and 8.  2. Chronic periodontitis with bone loss.  3. Generalized gingival recession.  4. Generalized tooth mobility.  5. Atrophy of the remaining edentulous alveolar ridges.  6. History of upper partial denture and lower complete denture which      were lost by [facility].  The patient will need a new upper and lower      complete denture after anticipated extractions.  Will check with      risk management to determine who is responsible for the loss      dentures.  7. Current heparin therapy with risk for bleeding with invasive dental      procedures.   PLAN/RECOMMENDATIONS:  1. I discussed the risks, benefits and complications of various      treatment options with the patient in relationship to the patient's      medical and dental conditions.  We discussed various treatment      options, to include no treatment, extraction of remaining teeth      with alveoloplasty, periodontal therapy, dental restorations, root      canal  therapy, crown or bridge therapy, implant therapy, and      replacement of missing teeth as indicated after adequate healing      with complete dentures.  The patient and wife are currently      interested in proceeding with extraction of the remaining teeth.      The patient will then follow with a dentist of  his choice for      fabrication of upper and lower complete dentures.  2. Discussion of findings with Dr. Ricke Hey and medical team      concerning scheduling dental extractions with alveoloplasty once      the heparin has been discontinued appropriately.      Charlynne Pander, D.D.S.  Electronically Signed     RFK/MEDQ  D:  09/29/2007  T:  09/29/2007  Job:  [id number]   cc:   Isidor Holts, M.D.   Lacretia Leigh. Ninetta Lights, M.D.  Fax: [phone]   Adolph Pollack, M.D.  [address], [address]  [place]  [place] [address]

## 2010-07-17 NOTE — Consult Note (Signed)
NAME:  [name], [name] NO.:  [id number]   MEDICAL RECORD NO.:  [id number]          PATIENT TYPE:  INP   LOCATION:  5526                         FACILITY:  [facility]   PHYSICIAN:  Cherylynn Ridges, M.D.    DATE OF BIRTH:  May 30, 1940   DATE OF CONSULTATION:  08/13/2007  DATE OF DISCHARGE:                                 CONSULTATION   Mobolaji B. Corky Downs, M.D.  [address]  [place], [place] [address]   Dear Dr. Corky Downs:   Thank you for asking me to see [name], a very unfortunate 70-year-  old gentleman with severe diabetes, morbid obesity, and stage IV  gluteal/ischial decubitus ulcers, which have developed over the last  several weeks.   The patient was in the hospital in the middle of May for below-knee  amputation, at which time, the wife reports that the patient was very  immobile and developed his decubitus ulcers.  He comes in today from a  nursing home with draining, foul-smelling fluid from his stage IV  ulcers.  I was asked by Dr. Corky Downs to see the patient.  He has had  multiple medical problems most of which are related to his morbid  obesity and his insulin dependent diabetes.   CURRENT MEDICATIONS:  Include aspirin, Lantus, Humalog, Synthroid,  Lasix, Ambien, Ativan, colchicine, Covera-HS, Diflucan, doxycycline,  Dulcolax, MiraLax, Neurontin, NovoLog, Phenergan, and Protonix.   ALLERGIES:  He has known drug allergies.   I examined the patient, he is morbidly obese, probably weighing over 350  pounds.  I do not know his height.  He has a draining foul-smelling and  poor healing BKA stump on his right side with staples in place on  bilateral gluteal cheeks.  On the ischiums, he has two stage IV, one  sort of stage III on the left side, one on the right side with stage IV  with an open tract with brown necrotic fluid and debris extruding from  the wound, which is markedly foul-swelling with a deep pocket.  It is  freely open and draining, it was covered  with gauze.  I was able to pass  my finger into pocket and without much discomfort, although the patient  states they are painful.  The one on the left side with a stage III to  IV but is not opening and draining yet, but likely has necrotic tissue  underneath that needs to be debrided.   My impression is that the patient has stage IV ulcers and need  debridement; however, because of the patient's severe immobility, his  morbid obesity, and likely recurrent contamination of these wounds with  stool, I think that a colostomy may be beneficial to the patient at the  same time he has debridement.  This has not  __________; however, the  patient may get adequate care without a colostomy; however currently  with these ulcers, he is going to end up with much larger wounds once  they are debrided, because they are widely open and draining.  The  patient's white count is normal.  He is afebrile, does not  look  septated, and has a pulse of 101.  I do not think that we need to  urgently take him to the operating room to night.  He also has some  renal insufficiency and some mild anemia that  need to be addressed.  He will be admitted to the [facility].  We will likely place him on the schedule to be debrided  tomorrow and discussed this with Dr. Ezzard Standing.   Sincerely,      Cherylynn Ridges, M.D.  Electronically Signed     JOW/MEDQ  D:  08/13/2007  T:  08/14/2007  Job:  [id number]   cc:   Mobolaji B. Corky Downs, M.D.

## 2010-07-17 NOTE — Discharge Summary (Signed)
NAME:  [name], [name] NO.:  [id number]   MEDICAL RECORD NO.:  [id number]          PATIENT TYPE:  INP   LOCATION:  5526                         FACILITY:  [facility]   PHYSICIAN:  Beckey Rutter, MD  DATE OF BIRTH:  August 31, 1940   DATE OF ADMISSION:  08/13/2007  DATE OF DISCHARGE:  09/01/2007                               DISCHARGE SUMMARY   For history of present illness and hospital course up to August 25, 2007,  please refer to the H&P and the previously dictated discharge summations  by Dr. Corky Downs. On August 18, 2007, and Dr. Midge Minium on August 25, 2007.   The patient continued to improve for the last few days.  He has been  fever free for almost 4 days now.  His wound culture was showing E. coli  sensitive to most antibiotics and currently the patient was kept on  Ceftin.  Recommendation is to continue on Ceftin for at least 14 days.   DISCHARGE MEDICATIONS:  1. Ceftin 500 mg p.o. b.i.d. for 15 days.  2. Synthroid 150 mcg daily.  3. Aspirin 81 mg daily.  4. Mentax 1 tab p.o. b.i.d.  5. Protonix 40 mg daily.  6. Humalog sliding scale.  7. Lantus 40 mg subcutaneous at night.  8. Neurontin 100 mg at night.  9. Proscar 40 mg daily.  10.Nystatin cream topically apply twice a day.  11.Colace 100 mg daily.  12.Renagel 1600 mg 3 times a day.  13.Aranesp 100 mcg subcutaneous on Monday.  14.MiraLax 17 g daily.  15.Vicodin 1-2 tablets every 4 hours p.r.n.  16.Atrovent 0.5 puff p.r.n.  17.Lactulose 30 mg p.o. q.4 h. p.r.n.  18.Dulcolax 10 mg p.o. p.r.n. daily.  19.Tylenol 650 mg p.r.n. daily, should not be given if the Vicodin was      given.  20.Ambien 5 mg p.o. at night.   DISCHARGE/PLAN:  The patient is stable, now to continue on Wound VAC and  antibiotic for a couple of more weeks.  It was recommended to reevaluate  the wound status and maybe send wound culture after 10 days.  The  patient is stable for discharge to skilled nursing facility.  He is  aware and agreeable to the discharge plan.      Beckey Rutter, MD  Electronically Signed     EME/MEDQ  D:  08/31/2007  T:  09/01/2007  Job:  [id number]   cc:   Dr. Michiel Cowboy, MD

## 2010-07-17 NOTE — Op Note (Signed)
NAME:  [name], [name] NO.:  [id number]   MEDICAL RECORD NO.:  [id number]          PATIENT TYPE:  INP   LOCATION:  5526                         FACILITY:  [facility]   PHYSICIAN:  Sandria Bales. Ezzard Standing, M.D.  DATE OF BIRTH:  January 25, 1941   DATE OF PROCEDURE:  08/14/2007  DATE OF DISCHARGE:                               OPERATIVE REPORT   Date of Surgery ??   PREOPERATIVE DIAGNOSIS:  Decubitus of right buttock and grade 2/3  decubiti of left buttock and intergluteal cleft.   POSTOPERATIVE DIAGNOSIS:  10 x 5 cm decubitus with subcutaneous muscular  necrosis of the right buttock, 7 x 6 cm grade 2/3 decubitus left  buttock, and 3 x 2 grade 2/3 intergluteal cleft.   PROCEDURE:  Debridement of right buttock decubitus necrosis.   SURGEON:  Kandis Cocking, MD.   FIRST ASSISTANT:  Rennis Harding.   ANESTHESIA:  General endotracheal.   ESTIMATED BLOOD LOSS:  50 mL.   DRAINS:  We left a 4 inch Kerlix pack in his right buttock wound.   INDICATIONS FOR PROCEDURE:  [name] is an unfortunate 70 year old  white male who had a recent right BK amputation by Dr. Aldean Baker, had  a prolonged hospital course with acute renal failure which slowly  resolved.  He was discharged home, but now he was readmitted with  problems with his rigth BK stump which Dr. Lajoyce Corners will work on tomorrow.  Dr. Lajoyce Corners was unavailable for today's surgery.  However, he has a  decubitus which has become worse and has necrosis with foul smelling  drainage from under the decubitus.  He now comes for incision and  drainage of the right buttock decubitus with evaluation of the left  buttocks decubitus.   The indications and risks were explained to the patient.   OPERATIVE NOTE:  The patient was placed in a left lateral decubitus  position after a general anesthetic.  His buttock was prepped with  Betadine solution and sterilely draped.  A time out was held identifying  the patient and the procedure and he was started on  Zosyn as antibiotic.   The right buttock decubitus measured 10 x 5 cm, the left buttock  decubitus measured 7 x 6 cm and in between his buttocks and intergluteal  cleft area, he had skin breakdown/decubital changes.  At first we went  up to the right decubitus.  This tunneled in multiple directions, it  tunneled down towards his leg, it tunneled out laterally and then  tunneled cranial. I opened up all the pockets of abscess.  I opened the  skin wide enough about full length and width of the abscess.  I  irrigated it with half-strength hydrogen peroxide and packed it with  Betadine gauze.   On his left side, this decubitus that he has is more superficial, it  appears 7 x 6 cm.  I did cut out 2 cm button of tissue in the middle to  make sure that no subcutaneous abscesses and I found none.   I packed the right buttock with the Betadine soaked 4 inch Kling, the  left buttock with gauze.  Sterilely dressed.  The patient was  transported to the recovery room in good condition.  I think most of his  dressing changes may be done at bedside now.  Because of the patient's  size and immobility, it will be difficult for these areas to heal any  time soon.      Sandria Bales. Ezzard Standing, M.D.  Electronically Signed     DHN/MEDQ  D:  08/14/2007  T:  08/15/2007  Job:  [id number]   cc:   Nadara Mustard, MD  Mobolaji B. Corky Downs, M.D.

## 2010-07-17 NOTE — Discharge Summary (Signed)
NAME:  [name], [name] NO.:  [id number]   MEDICAL RECORD NO.:  [id number]          PATIENT TYPE:  INP   LOCATION:  1507                         FACILITY:  [facility]   PHYSICIAN:  Isidor Holts, M.D.  DATE OF BIRTH:  22-Feb-1941   DATE OF ADMISSION:  09/22/2007  DATE OF DISCHARGE:                               DISCHARGE SUMMARY   PRIMARY MEDICAL DOCTOR:  Dr. Clearance [place].  The  patient is unassigned to [place].   PRIMARY ORTHOPEDIC SURGEON:  Dr. Aldean Baker.   DISCHARGE DIAGNOSES:  1. Infected sacral decubitus and left gluteal abscess.  2. Sepsis secondary to #1 above.  3. Type 2 diabetes mellitus with peripheral neuropathy.  4. Hypothyroidism.  5. Gout. Acute flare-up this hospitalization.  6. History of depression.  7. Anemia of chronic disease.  8. Loose teeth right upper jaw.   DISCHARGE MEDICATIONS:  These will be listed in addendum at the  appropriate time by discharging M.D.   PROCEDURES:  Chest x-ray dated September 22, 2007.  This showed stable  cardiomegaly and COPD/emphysema.  No acute cardiopulmonary disease.   CONSULTATIONS:  1. Dr. Avel Peace, general surgeon.  2. Dr. Johny Sax, infectious diseases specialist.   ADMISSION HISTORY:  As in H and P notes of September 22, 2007, dictated by  Dr. Vania Rea.  However, in brief, this is a 70 year old male,  with known history of type 2 diabetes mellitus with peripheral  neuropathy, history of gangrene right lower extremity status post right  below-knee amputation, depression, gout, hypothyroidism, anemia of  chronic disease, presenting with a 2-day history of fever and chills.  he was referred to the emergency department from the skilled nursing  facility where he was undergoing rehab since his right BKA in May 2009.  He has had impaired mobility, developed sacral decubiti, which have  proven difficult to heal, and have required debridement.  Apparently, he  had been found  to have a pyrexia of 104.2.  He was subsequently referred  to the emergency department, where on initial evaluation, he was found  to have a temperature of 100.3.  Pulse rate of 88.  Blood pressure  104/54 mmHg.  He had stage IV decubitus ulcers across the upper  buttocks, associated with a foul smell, and was admitted for further  evaluation, investigation and management.   CLINICAL COURSE:  1. Sepsis.  This was deemed secondary to infected sacral decubiti.      The patient was placed on a combination of intravenous  Ertapenem      and Vancomycin.  Septic workup was done.  Blood cultures remained      negative.  Chest x-ray was unremarkable for acute findings.  Urine      culture showed over 100,000 colonies of multiple bacterial      morphotypes, suggesting contamination.  However, wound culture grew      moderate gram-positive cocci in pairs, Klebsiella pneumonia      confirmed to be extended spectrum beta lactamase producing, as well      as abundant diphtheroids (corynebacterium species).  Infectious  diseases consultation was called which was kindly provided by Dr.      Verlee Rossetti who has rationalized the patient's antibiotic      therapy to a combination of Vancomycin and Imipenem, and has      recommended a total of 6 weeks of antibiotic treatment.  As of the      day of this dictation on September 27, 2007, the patient was on day #6      of antibiotic therapy.  The patient's wounds were managed by the      wound management care team during the course of this      hospitalization.  However, on September 24, 2007, the patient was noted      to have an abscess in the left gluteal region.  This necessitated      calling a surgical consultation, which was kindly provided by Dr.      Abbey Chatters, who performed an I&D at the bedside, on the same day.      With the above-mentioned management measures, the patient's      clinical condition has significantly improved, and as of September 27, 2007, he no longer appeared to be septic and his wounds appeared to      be healing well.   1. Type 2 diabetes mellitus.  This was managed with a combination of      carbohydrate-modified diet, sliding-scale insulin coverage and      scheduled Lantus insulin, with euglycemia. On September 27, 2007, the      morning CBG was 116.   1. Peripheral neuropathy.  This did not prove problematic.  The      patient continues on Neurontin.   1. Hypothyroidism.  The patient continues replacement therapy with      Synthroid.   1. Gout.  The patient is a known case of chronic gout.  He developed      an acute flare on September 23, 2007, affecting his elbows.  This      responded to a short course of oral steroids, as well as simple      analgesics.  The patient was thereafter placed on a week`s course      of NSAIDS, with satisfactory clinical response.  As of September 27, 2007, i.e., the date of this dictation, the patient was completely      asymptomatic from the point of view of gout.   1. Anemia of chronic disease.  The patient has a known history of      anemia of chronic disease.  During the course of his      hospitalization; however, his hemoglobin has remained      stable/reasonable.  As a matter of fact, on September 27, 2007,      hemoglobin was 10.3 with a hematocrit of 30.1.   1. History of depression.  The patient's mood has remained stable      during the course of his hospitalization.   1. Dental problems.  The patient on September 25, 2007, complained of loose      teeth in the right the upper jaw. He is quite edentulous, but these      teeth have become quite loose and he finds it difficult to manage      his food or diet.  He therefore requested a dental consultation.      Unfortunately, we were unable to reach Dr. Kristin Bruins, dental surgeon  on that date, and Dr. Luretha Murphy office was closed through the      weekend.  We plan to consult Dr. Kristin Bruins on September 28, 2007, and it      is  likely that the patient will require dental extraction.   DISPOSITION:  This will be elucidated in detail in an addendum at the  appropriate time, by discharging M.D.  However, it is anticipated that  over the course of the next few days the patient will recover  sufficiently to be discharged on for continued management in the nursing  facility environment.      Isidor Holts, M.D.  Electronically Signed     CO/MEDQ  D:  09/27/2007  T:  09/27/2007  Job:  [id number]

## 2010-07-17 NOTE — Op Note (Signed)
NAME:  [name], [name] NO.:  [id number]   MEDICAL RECORD NO.:  [id number]          PATIENT TYPE:  INP   LOCATION:  5526                         FACILITY:  [facility]   PHYSICIAN:  Nadara Mustard, MD     DATE OF BIRTH:  Mar 15, 1940   DATE OF PROCEDURE:  08/15/2007  DATE OF DISCHARGE:                               OPERATIVE REPORT   PREOPERATIVE DIAGNOSIS:  Dehiscence and gangrenous abscess, right  transtibial amputation.   POSTOPERATIVE DIAGNOSIS:  Dehiscence and gangrenous abscess, right  transtibial amputation.   PROCEDURE:  Revision, right transtibial amputation.   SURGEON:  Nadara Mustard, MD   ANESTHESIA:  General.   ESTIMATED BLOOD LOSS:  Minimal.   ANTIBIOTICS:  Vancomycin and Zosyn preoperatively.   DRAINS:  None.   COMPLICATIONS:  None.   BLOOD PRODUCTS:  1 unit of packed red blood cells.   DISPOSITION:  To PACU in stable condition.   CULTURES:  Obtained.   INDICATIONS FOR PROCEDURE:  The patient is a 70 year old gentleman  status post a right transtibial amputation who returned to the hospital  secondary to decubitus ulcers as well as abscess from the right  transtibial amputation.  He was brought to the operating room yesterday  for debridement of decubitus ulcers and was brought to the OR today for  revision of a transtibial amputation.  Risks and benefits were discussed  with the patient and his wife including infection, neurovascular injury,  nonhealing of wound, need for higher-level amputation.  The patient and  his wife state they understand and wished to proceed at this time.   PROCEDURE:  The patient was brought to OR room-5 and underwent general  anesthetic.  After adequate level of anesthesia was obtained, the  patient was placed on the operating table, and the right lower extremity  was prepped using Betadine paint and draped into a sterile field.  The  incision was ellipsed out in one block of tissue down to bone.  The  tibia and  fibula were revised.  The muscle was debrided back to bleeding  viable tissue.  The vascular bundles were suture ligated with the 2-0  silk.  The wound was then irrigated with pulsatile lavage.  There was no  necrotic tissue, no abscess, and no nonviable tissue.  The incision was  closed using a 2-0 nylon in approximate staples.  The wound was covered  with Adaptic, orthopedic sponges, ABD dressing, Kerlix, and Coban.  The  patient was then extubated and taken to the PACU in stable condition.      Nadara Mustard, MD  Electronically Signed     MVD/MEDQ  D:  08/15/2007  T:  08/15/2007  Job:  [id number]

## 2010-07-17 NOTE — Op Note (Signed)
NAME:  [name], [name] NO.:  [id number]   MEDICAL RECORD NO.:  [id number]          PATIENT TYPE:  INP   LOCATION:  5036                         FACILITY:  [facility]   PHYSICIAN:  Nadara Mustard, MD     DATE OF BIRTH:  01/23/1941   DATE OF PROCEDURE:  07/08/2007  DATE OF DISCHARGE:                               OPERATIVE REPORT   PREOPERATIVE DIAGNOSIS:  Gangrene, right ankle.   POSTOPERATIVE DIAGNOSIS:  Gangrene, right ankle.   PROCEDURE:  Right transtibial amputation.   SURGEON:  Nadara Mustard, MD   ANESTHESIA:  General.   ESTIMATED BLOOD LOSS:  Minimal.   ANTIBIOTICS:  2 g of Kefzol.   DRAINS:  None.   COMPLICATIONS:  None.   TOURNIQUET TIME:  10 minutes at 350 mmHg of the thigh.   DISPOSITION:  To PACU in stable condition.   SPECIMEN:  Sent to Pathology.   INDICATIONS FOR PROCEDURE:  The patient is a 70 year old gentleman with  type 2 diabetes, who has had progressive ulceration and gangrenous  changes to the right ankle.  He has complete collapse of the soft tissue  with necrotic tissue, cellulitis, and abscess, and due to failed  conservative care, the patient presents at this time for transtibial  amputation.  Risks and benefits were discussed including infection,  neurovascular injury, persistent pain, bleeding, DVT, nonhealing of the  wound, and need for higher-level amputation.  The patient states he  understands and wished to proceed at this time.   DESCRIPTION OF PROCEDURE:  The patient was brought to OR #10 and  underwent a general anesthetic.  After adequate level of anesthesia  obtained, the patient's right foot and ankle were draped with an  impervious Ioban and draped out of the sterile field.  The right lower  extremity was then prepped using DuraPrep, draped into sterile field,  and the foot was draped into an impervious stockinette.  A transverse  incision was made 10 cm distal to the tibial tubercle.  This curved  proximally and  a large posterior flap was created.  The tibia was  transected 1 cm proximal to the skin incision.  The fibula was  transected 1 cm proximal to the tibia incision.  An amputation knife was  used to create a large posterior flap.  The sciatic nerve was pulled,  cut, and allowed to retract.  The vascular bundles were suture ligated  x2 each with the 2-0 silk.  The tourniquet was deflated after 10  minutes.  Hemostasis was obtained.  The deep and superficial fascial  layers were closed using #1 PDS.  The skin was closed using  approximating staples.  The wound was covered with Adaptic orthopedic sponges, ABD dressing,  Webril, and Coban.  The patient was extubated and taken to PACU in  stable condition.  Follow  up in the office in 2 weeks after discharge.  Anticipate he will need rehab postoperatively.      Nadara Mustard, MD  Electronically Signed     MVD/MEDQ  D:  07/08/2007  T:  07/09/2007  Job:  [id number]

## 2010-07-17 NOTE — Consult Note (Signed)
NAMEMAZE, CORNIEL NO.:  [id number]   MEDICAL RECORD NO.:  [id number]          PATIENT TYPE:  INP   LOCATION:  2109                         FACILITY:  [facility]   PHYSICIAN:  Dyke Maes, M.D.DATE OF BIRTH:  [birth year]   DATE OF CONSULTATION:  07/13/2007  DATE OF DISCHARGE:                                 CONSULTATION   CHIEF COMPLAINT:  Acute renal failure.   HISTORY OF PRESENT ILLNESS:  This is a 70 year old male with gangrene  who was admitted for right BKA.  He has type 2 diabetes.  He had had a  worsening ulcer and therefore needed amputation.  Amputation was  performed on Jul 08, 2007.  He remained in the hospital postoperatively  without consultation until he was noted to be hypoxic and somnolent on  Jul 12, 2007.  He also was hypotensive at that time.  He had  desaturation as well.  Narcan was given with improvement.  The patient  was initially placed on BiPAP.  However, this was discontinued rather  quickly.  We were asked to see the patient because his labs were  checked.  At the time of this incident, it was noted his creatinine went  from 1.47 to 5 to almost 6.  His baseline creatinine was approximately  1.7.  Please note that he did have a creatinine of 3 in April 2007.  Also noted that at the time of admission, the patient was tachycardic  and hypotensive.  He received 2 units of packed red blood cell on Jul 12, 2007, as well as FFP.   PAST MEDICAL HISTORY:  Type 2 diabetes, morbid obesity, hypothyroidism,  chronic gait disorder, edema, and hyperlipidemia.  He denies history  of hypertension, although it appears in his records at some point.   SOCIAL HISTORY:  Did not obtain.   FAMILY HISTORY:  He has had mother and grandmother with dialysis  secondary to hypertension.   REVIEW OF SYSTEMS:  Denies all complaints except for weakness and  sleepiness.   MEDICATIONS:  1. Vancomycin.  2. Zosyn 2.25 mg q.8 h.  3. Neurontin 300 b.i.d.  4. Aspirin 81 mg.  5. Protonix 40 mg.  6. Normal saline with 100 mL an hour.  7. Synthroid 150 mcg daily.  8. Metanx 1 tablet p.o. b.i.d. with meals.   The patient's home meds include Lasix, Actos, colchicine 0.6 two tablets  b.i.d. as well as Crestor.  Previously, during his hospitalization, he  had been on Lovenox and given vitamin K as well.   PHYSICAL EXAMINATION:  VITAL SIGNS:  Temperature 99.7, which is his T-  max; pulse 75, respiratory rate 22, blood pressure 133/46, ranges from  86 to 123 over 37 to 60, O2 99% on 3 liters.  I's and O's, he has 3120  mL of urine in and 447 out over the last few hours.  His urine output  was 5 mL of urine.  GENERAL:  Not in acute distress, obtunded but arouses to noise.  HEENT:  Normocephalic and atraumatic.  Extraocular muscles are intact.  Sclerae clear.  Dry mucous  membranes.  NECK:  Negative JVD.  CARDIOVASCULAR:  Distant heart sounds, regular rate and rhythm, no rubs,  gallops, or murmurs.  Pulses are 2+ and equal bilaterally without  bruits.  LUNGS:  Clear to auscultation bilaterally, although exam was limited by  large body habitus.  SKIN:  No rash or lesions.  GASTROINTESTINAL:  Obese abdomen, soft, and nontender.  Normal bowel  sounds.  No rebound.  No guarding.  EXTREMITIES:  No rash.  There is 2+ edema in the left lower extremity,  right lower extremity is status post amputation with bandage at site.  ACCESS:  Right IJ.  MUSCULOSKELETAL:  No deformity.  NEUROLOGIC:  Oriented to person only.  Cranial nerves II through XII  intact.  He does have asterixis.   Chest x-ray, vascular congestion and mild bilateral basilar airspace  disease.  Renal ultrasound, no hydronephrosis and unremarkable bladder.   LABORATORY DATA:  ABG; 7.29/51.7/109/24.1.  White count 7.1, hemoglobin  8.9, hematocrit 22.2, and platelets 183.  Sodium 138, potassium 5.4,  chloride 99, bicarb 26, BUN 85, creatinine 5.96, calcium 9, total  bilirubin 1.3, and  direct 0.6.  AST 7748, ALT 1915, amylase 138, LDH  7020, ESR 68, lipase 32, troponin 2.54, 2.61, 2.13; CK-MB 12.1, 10.2;  CPK 7402, 7917.   ASSESSMENT:  A 70 year old male with acute renal failure and chronic  renal disease, elevated transaminase, status post right below-knee  amputation.  1. Acute renal failure.  Unclear etiology.  The patient does have a      baseline kidney insufficiency per records and has seen Dr. Caryn Section in      the past.  Differentials include acute tubular necrosis secondary      to hypotension, ischemia, acute interstitial nephritis secondary to      antibiotic.  An non-ST elevation myocardial infarction causing      ischemia could be etiology of the hypoxic event.  We will check      FENa.  We will start dialysis today.  He is unable to clear the      toxins and his creatinine seems to worsening.  We will keep the      patient euvolemic and not pull off any fluid at this point.  We      will follow electrolytes and urine output as well.  We expect to      regain full renal function at his baseline.  We will continue to      dialyze the patient  based on how his clinical exam and labs look.  2. Elevated transaminase with again unclear etiology per ortho; this      is without of signs of infection. Blood cultures are no growth to      date  3. Hypotension:  Body habitus limits imaging.  Primary team is      managing this problem  It could also be related to due to      hypertensives and anesthetics given.  4. Diabetes type 2.  Per primary team.  5. Hypothyroidism.  Per primary team.  6. Hyperlipidemia.  We are going to hold statin, but it is also being      managed by primary team.      Johney Maine, M.D.  Electronically Signed     ______________________________  Dyke Maes, M.D.    JT/MEDQ  D:  07/13/2007  T:  07/14/2007  Job:  [id number]

## 2010-07-17 NOTE — Consult Note (Signed)
NAME:  [name], [name] NO.:  [id number]   MEDICAL RECORD NO.:  [id number]          PATIENT TYPE:  EMS   LOCATION:  ED                           FACILITY:  [facility]   PHYSICIAN:  Charlcie Cradle. Delford Field, MD, FCCPDATE OF BIRTH:  12/06/1940   DATE OF CONSULTATION:  12/15/2007  DATE OF DISCHARGE:                                 CONSULTATION   CHIEF COMPLAINT:  Septic shock.   HISTORY OF PRESENT ILLNESS:  A 70 year old white male with multiple  medical problems just discharged from [facility] a week  ago after a prolonged treatment for decubitus ulcer and Clostridium  difficile toxin.  Wife had noted significant weakness and increased  confusion today with low blood pressure.  Foley catheter was not  draining well.  Was changed by home-health RN today and began draining  pus that was thick and yellow.  Also notes blue stools several times a  day since discharge.  Had been on six weeks of oral vancomycin in the  past.  Did require hemodialysis in the past but has been now free of  this since May, 2009.   PAST MEDICAL HISTORY:  1. Diabetes.  2. 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  3. Decubitus ulcer in July, 2009.  4. Previous sepsis of July, 2009 with Klebsiella and Corynebacterium.  5. Depression.  6. Hypothyroidism.  7. Gout.  8. Chronic anemia.  9. Clostridium difficile colitis in the past.  10.Chronic periodontitis.  11.Renal insufficiency.  12.Morbid obesity.  13.Indwelling Foley catheter.   ALLERGIES:  None.   MEDICATIONS PRIOR TO ADMISSION:  1. Allopurinol.  2. Aspirin.  3. Centrum.  4. Prozac.  5. Zinc.  6. Synthroid.  7. Prilosec.  8. Renagel.  9. Ambien.  10.Roxicodone.  11.Neurontin.   FAMILY HISTORY:  Reviewed and no changes.   SOCIAL HISTORY:  Has just been discharged to home from the nursing home  on December 09, 2007.  Lives at home with his wife.  Does not smoke or  drink.   PHYSICAL EXAMINATION:  Blood pressure 80/40, heart  rate 130,  respirations 16, temperature 97.5 rectal.  Saturation 98% on 2 liters.  GENERAL:  A morbidly obese, dusky-appearing male.  HEENT:  Normocephalic and atraumatic.  No icterus.  Dry mucous  membranes.  NECK:  Thick without thyromegaly or adenopathy.  CHEST:  Clear bilaterally.  CARDIAC:  Regular rate and rhythm without S3.  Normal S1 and S2.  ABDOMEN:  Distended.  Bowel sounds hypoactive.  Abdomen is nontender.  The Foley is draining purulent drainage.  MUSCULOSKELETAL:  No joint pain or swelling.  SKIN:  Decubitus ulcer noted on the back.  NEUROLOGIC:  Moves all four.  Is lethargic but oriented partially and is  conversational.   LABORATORY DATA:  Hemoglobin 10, white count 16,000, platelet count  347,000.  Sodium 135, potassium 2.8, chloride 107, CO2 18, BUN 59,  creatinine 5.2, blood sugar 182.  Liver functions normal.  Albumin 2.  Lactic acid is normal.  Urinalysis:  Too-numerous-to-count white cells,  many bacteria, red cells 11-20.   CHEST X-RAY:  Cardiomegaly.  Mild vascular congestion.   IMPRESSION:  1. Septic shock with urinary tract infection.  2. Recent Clostridium difficile colitis.  3. Decubitus ulcer.   PLAN:  1. Admit to the ICU with early goal-directed therapy.  Placement of      central line.  Empiric vancomycin and Primaxin.  Send stool for C.      diff.  Given empiric Flagyl.  Renal insufficiency.  Patient's      baseline creatinine is 1, now 5.19.  Is elevated.  Has acute renal      failure, likely due to volume depletion and clogged Foley catheter.      Will follow up BMET for this.  2. Hypokalemia:  Likely due to diarrhea.  Will replete gently.  3. Decubitus ulcer:  Ask wound care team to follow.  4. Anemia of chronic disease:  Will follow.  5. Diabetes:  Give hyperglycemia protocol.  6. Metabolic acidosis:  Non-anion gap likely due to diarrhea.  Will      monitor.  7. Hypothyroidism:  Synthroid when stable.  Previous BK on the right.      Stump  is well-healed.  Will follow.  8. Prophylaxis:  Will give heparin subcu and proton pump inhibitor.      Charlcie Cradle Delford Field, MD, [facility]  Electronically Signed     PEW/MEDQ  D:  12/15/2007  T:  12/15/2007  Job:  [id number]   cc:   Lelon Perla, DO  [address] [place], [place] [address]

## 2010-07-17 NOTE — Discharge Summary (Signed)
NAME:  [name], [name] NO.:  [id number]   MEDICAL RECORD NO.:  [id number]          PATIENT TYPE:  INP   LOCATION:  5526                         FACILITY:  [facility]   PHYSICIAN:  Eduard Clos, MDDATE OF BIRTH:  [birth year]   DATE OF ADMISSION:  08/13/2007  DATE OF DISCHARGE:                               DISCHARGE SUMMARY   INTERIM DISCHARGE SUMMARY:  Please refer to the discharge summary  dictated  on August 18, 2007, for the hospital course and the procedures  done prior to this discharge summary.   Subsequent to the last discharge summary, the patient's condition  gradually started improving.  The patient was found to be anemic, for  which 2 units of packed red blood cells were transfused.  His creatinine  started improving from 3.53 and now it is 1.18.  The patient is having  good urine output.  The plans were to discharge the patient home but at  the time of planning, the patient started developing some chills and  rigors.  The patient also started spiking a fever.  So at this time  blood cultures are being ordered.  The patient's antibiotics have been  increased to vancomycin and Zosyn.  The patient's wound cultures did  show E. coli which was susceptible to cyclosporin and Cipro, but at this  time the patient had a fever and also had chills.  We will follow up his  blood cultures and further recommendations accordingly.   FINAL DIAGNOSES:  1. Bilateral gluteal decubitus with radial collapse, status post      debridement.  2. Infected right below-the-knee amputation, status post revision.  3. Acute and chronic renal failure.  4. Morbid obesity.  5. Anemia, status post packed red blood cell transfusion.  6. Diabetes mellitus type 2, getting under better control.  7. Hyponatremia, resolved.  8. History of peripheral neuropathy.  9. History of hypothyroidism.   PLAN:  At this time surgery feels that if the patient is discharged, the  patient is to be on  a wet to dry dressing and to follow with them for  wound care.  The orthopedic doctor, Dr. Nadara Mustard, has advised that  the patient needs to follow up with him in two weeks' time after  discharge.  Cultures have been ordered, and we will follow up with the  cultures.  If the patient's  fevers are controlled and the blood  cultures show no growth, then to consider discharge in 24-48 hours from  now.      Eduard Clos, MD  Electronically Signed     ANK/MEDQ  D:  08/25/2007  T:  08/25/2007  Job:  [id number] [id number]

## 2010-07-17 NOTE — H&P (Signed)
NAME:  [name], [name] NO.:  [id number]   MEDICAL RECORD NO.:  [id number]          PATIENT TYPE:  EMS   LOCATION:  MAJO                         FACILITY:  [facility]   PHYSICIAN:  Mobolaji B. Bakare, M.D.DATE OF BIRTH:  12/07/1940   DATE OF ADMISSION:  08/13/2007  DATE OF DISCHARGE:                              HISTORY & PHYSICAL   PRIMARY CARE PHYSICIAN:  Unassigned (Dr. Clearance Coots in [place])   ORTHOPEDIC SURGEON:  Nadara Mustard, MD   CHIEF COMPLAINT:  Mild odorous discharge from decubitus ulcers.   HISTORY OF PRESENTING COMPLAINT:  Mr. Mitro is a 70 year old diabetic  and obese, morbidly obese patient who was recently hospitalized in [facility] for about a month.  During that admission, he developed a sacral  decubitus ulcers. He was treated during that hospitalization for right  foot gangrene.  He underwent a right below-knee amputation.  He was  doing well until 2 days ago when nursing staff noticed increasing  drainage from both bilateral decubiti ulcers. They became worse today  and he was sent to the emergency room. He was reported to have had a  fever at the nursing home with decreased appetite, nausea.   REVIEW OF SYSTEMS:  He denies cough, chest pain, shortness of breath,  abdominal pain.  There is some pain at the incisional sites of below-  knee amputation.  No dysuria or foul-smelling urine. No confusion or  dizziness.   PAST MEDICAL HISTORY:  1. Diabetes mellitus.  2. Morbid obesity.  3. Sacral decubiti ulcers.  4. Hypoxic respiratory failure during recent hospitalization.  5. Acute renal failure during recent hospitalization.  6. Peripheral neuropathy.  7. History of gangrene right foot status post below-knee amputation.  8. Hypothyroidism.  9. Constipation.   CURRENT MEDICATIONS:  1. Synthroid 150 mcg daily.  2. Aspirin 81 mg daily.  3. Mentax one p.o. b.i.d.  4. Protonix 40 mg daily.  5. Humalog sliding-scale.  6. Neurontin 100 mg  nightly.  7. Proscar 40 mg daily.  8. Nystatin cream topically.  9. Colace 100 mg daily.  10.Renagel 1600 mg t.i.d.  11.Aranesp 100 mcg subcutaneous on Mondays.  12.MiraLax 17 grams daily.  13.Colchicine 0.6 p.o. daily.  14.Lantus 40 mg subcutaneous nightly.  15.Green vitamin one b.i.d.  16.Vicodin p.r.n. one p.o. q.4 h p.r.n.  17.Atrovent 0.5 p.o. nightly p.r.n.  18.Lactulose 30 mL p.o. q.4 h p.r.n.  19.Dulcolax 10 mg p.o. p.r.n. daily.  20.Tylenol 650 mg p.o. q.8 h p.r.n.  21.Ambien 5 mg p.o. nightly p.r.n. sleep.   ALLERGIES:  No known drug allergies.   FAMILY HISTORY:  Noncontributory.   SOCIAL HISTORY:  The patient is a resident of a skilled nursing  facility.  He is dependent on activities of daily living.  Does not  smoke cigarettes nor drink alcohol.   INITIAL VITALS: Temperature 97.8, blood pressure 121/50, pulse of 101,  respiratory rate of 16, O2 sats of 98%.  On examination, the patient is awake, alert, oriented to time, place and  person.  Normocephalic, atraumatic.  Pupils equal, round and reactive to  light.  Mucous  membranes dry.  No elevated JVD.  No carotid bruit.  LUNGS: Clear clinically to auscultation.  CVS: S1-S2 regular.  ABDOMEN: Not distended, obese, soft, nontender.  No palpable  organomegaly.  EXTREMITIES: Right below-knee amputation incision site is gaping with  pockets of drainage, some sutures intact.  BUTTOCKS:  The patient has a deep-seated abscess with offensive  discharge from both buttocks bilaterally.  CNS: No focal neurological deficit.   INITIAL LABORATORY DATA:  Sodium 130, potassium 4.2, chloride 101, CO2  21, glucose 222, BUN 34, creatinine 1.84, calcium 8.7 white cells 8.7,  hemoglobin 10.0, hematocrit 29.3, platelets 325, neutrophils 73%,  absolute neutrophil count 6.3.   ASSESSMENT AND PLAN:  1. Mr. Wentworth is a 70 year old morbidly obese diabetic patient      presenting with purulent mild odorous discharge from both  buttocks      decubitus ulcers. He has had fever at the nursing home.  White cell      count is normal.  He is hemodynamically stable. The patient will      need debridement of these decubiti. Wound cultures will be sent.      He will be started on vancomycin and Zosyn. Surgical consult has      been called.  2. Right below-knee amputation with infected incision site and gaping      wound. Orthopedic surgeon (Dr. Lajoyce Corners) will need to followup with the      patient.  Wound care will be instituted.  The patient to be placed      on antibiotics as mentioned above.  3. Diabetes mellitus.  Will resume Lantus and sliding scale insulin.      Will optimize treatment. Will check hemoglobin A1c.  4. Mild hyponatremia likely secondary to diuretics.  Given the      patient's underlying medical problems, will hold diuretics for now      and start IV fluids.  5. Renal insufficiency. BUN and creatinine appears to have improved      from recent hospitalization.  Will monitor      closely during this hospitalization.  It is noted that BUN was 50      and creatinine of 3.95 on August 03, 2007.  Given underlying      infection, will hold diuretics and give gentle hydration.  6. Normocytic anemia.  The patient is on Aranesp will continue this to      keep hemoglobin above 10 gm/dL.      Mobolaji B. Corky Downs, M.D.  Electronically Signed     MBB/MEDQ  D:  08/13/2007  T:  08/13/2007  Job:  [id number]   cc:   Nadara Mustard, MD

## 2010-07-20 NOTE — Discharge Summary (Signed)
NAME:  [name], [name] NO.:  [id number]   MEDICAL RECORD NO.:  [id number]          PATIENT TYPE:  INP   LOCATION:  3003                         FACILITY:  [facility]   PHYSICIAN:  Hollice Espy, M.D.DATE OF BIRTH:  10-09-1940   DATE OF ADMISSION:  06/19/2005  DATE OF DISCHARGE:  06/23/2005                                 DISCHARGE SUMMARY   PRIMARY CARE PHYSICIAN:  Leanne Chang, M.D.   DISCHARGE DIAGNOSES:  1.  Acute renal failure, now improved secondary to diuretic use and ACE      inhibitor.  2.  Deconditioning requiring physical therapy.  3.  Acute gout.  4.  Diarrhea secondary to colchicine medications.  5.  Diabetes mellitus.  6.  Hypothyroidism.  7.  Depression.  8.  Hyperlipidemia.  9.  History of peripheral neuropathy.  10. B-12 deficiency.   DISCHARGE MEDICATIONS:  The patient will continue on the following  medications as before:  1.  Lantus 70 units subcu q.h.s.  2.  Humalog 20 mg subcu with meals.  3.  Actos 45 p.o. daily.  4.  Crestor 20 p.o. daily.  5.  Synthroid 150 mcg p.o. daily.  6.  Aspirin 81 p.o. daily.  7.  Oxycodone 10/325 p.o. b.i.d.  8.  Requip 2 mg p.o. q.h.s.  9.  Diovan/hydrochlorothiazide will be held until he follows up with his      PCP.  His Lasix will be held for two more days and then resume at a      lower dose of 20 mg p.o. daily, previously it was 60 mg p.o. daily.  10. I believe he is on Cymbalta for depression, and he receives 1000 mcg of      vitamin B12 subcu monthly.   NEW MEDICATIONS:  The patient will be discharged on:  Colchicine 0.6 mg p.o.  daily for gout.  He has instructions that if heavy diarrhea occurs, the  patient will stop at 48 hours and resume the dose at 0.6 mg every other day.   HISTORY OF PRESENT ILLNESS:  The patient is a 70 year old white male with  past medical history of morbid obesity, diabetes mellitus, hypertension,  hyperlipidemia and gout who presented to the emergency room  after  complaining of weakness, lower extremity swelling and leg pain.  According  to the patient's wife, for the past several weeks, he has been more and more  fatigued, barely moving.  She was concerned that either he was having an  illness or his depression was flaring up.  She brought him to the emergency  room for evaluation.  There, a chest x-ray was done which showed no evidence  of any congestive heart failure changes, and in regard to the patient's  lower extremity swelling, it was mostly confined to his below the posterior  calves and down to the ankles.  Most concerning, however, was the patient's  labs on admission.  The patient had a BUN of 115 and a creatinine of 2.4.  The patient has no previous history of any kidney issues, with his markedly  elevated BUN.  I  suspect that most of this is all prerenal secondary to  severe dehydration.  In addition, the patient showed no signs of acute  congestive heart failure.  BNP was not checked secondary to it being falsely  elevated secondary to his renal failure.  The patient was started on gentle  IV hydration.  Initially, I started him on 75 cc an hour.  He is currently  actually increased to 3 by hospital day #2.  In addition, I have held his  ACE inhibitor, hydrochlorothiazide and Lasix as well as aspirin.   HOSPITAL COURSE:  #1 - ACUTE RENAL FAILURE.  As mentioned, the patient's  creatinine increased to 3.  I increased his IV fluids to 100 cc an hour and  he responded well.  Over the next several days, the patient's BUN and  creatinine continued to improve, and by day of discharge, the patient was  felt to be even closer to his baseline normal baseline.  His BUN and  creatinine at time of discharge were 39 and 1.6.  He still has room to show  some significant improvement.  At this point, in terms of his renal  function, I am recommending that he be discharged home.  Hold his Diovan and  hydrochlorothiazide, until he is followed up  by his PCP at the end of the  week.  In addition, I am holding his Lasix for several more days.  When it  is restarted, it is restarted at a much lower dose of 20 mg p.o. daily.  In  regards to the patient's gout, the patient was telling me that with the leg  pain that he has, it felt different than his neuropathy, and he was  concerned that he may have gout which he has had in the past.  His serum  uric acid level was shown to be markedly elevated at 14.5.  Started the  patient on colchicine 0.6, p.o. q.i.d. which greatly improved the patient's  symptoms.  On April 21, the patient started having episodes of diarrhea  heavily, and I discontinued his colchicine.  I am restarting it now that the  diarrhea is passed.  He tells me his pain has resolved p.o. daily dose.  Discussed with him and his wife about heavy diarrhea reoccurrence.  Stopped  the medication at 48 hours and then resume the dose to every other day.  In  regards to the patient's B-12 deficiency, the patient was due for his B-12  shots.  Prior to discharge, he was giving 1000 mcg subcu IM.  In regards to  the patient's diabetes mellitus, this is actually quite well controlled  given the patient's morbid obesity.  I checked a hemoglobin A1C, and it was  only minimally elevated at 6.7.  The patient showed signs of recurrent  hypothyroidism which was stable.  His TSH was stable.  In regards to the  patient's morbid obesity, this is a stable issue as well.  In regards to his  hypertension and hyperlipidemia, these were stable issues.  His medications  were continued.   PLAN:  Patient will follow up with Dr. Leanne Chang at the end of the  week.  The patient was evaluated on physical therapy.  He recommended Home  Health and PT, and this has been set up.  The patient will then be  discharged on a carb modified, low sodium diet.   CONDITION ON DISCHARGE:  Improved.   DISPOSITION:  Discharged to home under the care of his  wife.      Hollice Espy, M.D.  Electronically Signed     SKK/MEDQ  D:  06/23/2005  T:  06/25/2005  Job:  [id number]   cc:   Leanne Chang, M.D.  Fax: [phone]

## 2010-07-20 NOTE — H&P (Signed)
NAMESAN, LOHMEYER NO.:  [id number]   MEDICAL RECORD NO.:  [id number]          PATIENT TYPE:  INP   LOCATION:  0106                         [facility]ACILITY[facility]  [facility]   PHYSICIAN:  Georgina Quint. Plotnikov, MDDATE OF BIRTH:  Jul 07, 1940   DATE OF ADMISSION:  04/16/2006  DATE OF DISCHARGE:                              HISTORY & PHYSICAL   CHIEF COMPLAINT:  Foot problem.   HISTORY OF PRESENT ILLNESS:  Patient is a 70 year old male with type 2  diabetes, who is admitted due to a problem with diabetic foot.  He was  unaware of a problem until Sunday when he took off his sock and a lot of  skin came off his left foot with the sock, revealing a vast red area on  the bottom of his foot.  He went to see Dr. Blossom Hoops today and was sent  to the wound center at [facility].  Subsequently, he was sent to the ER  for admission.  He received IV vancomycin.  He really has no active  complaints otherwise.   PAST MEDICAL HISTORY:  1. Diabetes.  2. Morbid obesity.  3. Hypothyroidism.  4. History of ankle fracture.  5. Chronic gait disorder.  6. Chronic leg swelling.   CURRENT MEDICATIONS:  1. Lantus 70 units at bedtime.  2. Diovan/HCT 160/12.5 daily.  3. Humalog 20 units before meals.  4. Actos 45 mg daily.  5. Crestor 20 mg daily.  6. Synthroid 150 mcg daily.  7. Lasix 60 mg daily.  8. Aspirin 81 mg daily.  9. Percocet 10/650 q.i.d. p.r.n.   ALLERGIES:  No known drug allergies.   SOCIAL HISTORY:  He is married.  He is a retired Scientist, product/process development.  Does  smoke or drink alcohol.   FAMILY HISTORY:  Positive for diabetes.   REVIEW OF SYSTEMS:  No personal history of smoke or MI.  Chronic problem  with weight.  Chronic problem with arthritis.  Uses two canes to  ambulate.  Somewhat decreased sensation in the lower extremities.  Denies chest pain or shortness of breath.  No syncopal spells.  The rest  is as above or negative.  Blood sugars have been high lately, over 200.   PHYSICAL EXAMINATION:  VITAL SIGNS:  Blood pressure 157/81, temp 97.8,  heart rate 83, respirations 20, sats 95% on room air.  GENERAL:  He is in no acute distress.  Morbidly obese.  HEENT:  Moist mucosa.  NECK:  Short.  Supple.  LUNGS:  Clear.  Decreased breath sounds.  No wheezes or rales.  HEART:  S1 and S2.  Distant tones.  ABDOMEN:  Obese.  Soft and nontender.  No organomegaly.  No masses felt.  EXTREMITIES:  Lower extremities 2-3+ edema bilaterally.  Pulses  nonpalpable.  SKIN:  Dry.  There is a vast erosion on the bottom of his left mid foot,  measuring about 5 x 15 cm.  Bright red with granulating tissue.  There  is a gray to brown necrotic area involving the left fifth toe and  extending laterally to the end of the erosion.  It is  foul-smelling and  fairly dry.   LABS:  White count 8.2, hemoglobin 12.2, platelets 184,000.   ASSESSMENT/PLAN:  1. Left diabetic foot/necrotic foot wound:  Will obtain ortho consult      tomorrow, Dr. Lajoyce Corners.  Start IV Unasyn (he received vanco tonight).      Obtain MRI of the foot.  2. Lower extremity edema:  Will use IV Lasix.  Elevate feet.  3. Uncontrolled diabetes:  Will use Lantus and sliding-scale insulin.      Continue other therapy.  4. Morbid obesity:  Will need dietary consult.  5. Deep venous thrombosis prophylaxis.  6. Other problems.  Will address as we go.      Georgina Quint. Plotnikov, MD  Electronically Signed     AVP/MEDQ  D:  04/16/2006  T:  04/16/2006  Job:  [id number]   cc:   Leanne Chang, M.D.  Fax: [phone]   Nadara Mustard, MD  Fax: [phone]

## 2010-07-20 NOTE — H&P (Signed)
NAME:  [name], [name] NO.:  [id number]   MEDICAL RECORD NO.:  [id number]          PATIENT TYPE:  EMS   LOCATION:  MAJO                         FACILITY:  [facility]   PHYSICIAN:  Hollice Espy, M.D.DATE OF BIRTH:  1940/04/19   DATE OF ADMISSION:  06/19/2005  DATE OF DISCHARGE:                                HISTORY & PHYSICAL   PRIMARY CARE PHYSICIAN:  Leanne Chang, M.D.   CHIEF COMPLAINT:  Leg pain and swelling.   HISTORY OF PRESENT ILLNESS:  The patient is a 70 year old white male with a  past medical history of obesity.  Diabetes mellitus and hypertension.  Who  presents to the emergency room after several weeks of worsening lower  extremity swelling and pain.  This is according to his wife.  He has not had  a very good appetite lately.  He has not been getting around lately, very  much.  He denies any shortness of breath or chest pain.  He tells me that he  is compliant with his blood pressure and diabetic medications, and that his  blood pressure and diabetes are under good control.   On arrival to the emergency room, the patient was noted to have a normal  blood pressure and heart rate.  However, when labs were checked he was found  to be in acute renal failure with a BUN of 115 and a creatinine of 2.4.  Chest x-ray was unremarkable and showed no evidence of any acute CHF.  Other  labs were normal with a white count of 7 with an 84% shift; and an elevated  D-dimer.  The patient tells me that the lower extremity swelling is from the  gout which has flared up before in the past.  The patient, otherwise is  doing well.  He denies any headaches, vision changes, dysphagia, chest pain,  palpitations, shortness of breath, wheeze, cough, abdominal pain, hematuria,  dysuria, constipation, diarrhea.  He does complain of some bilateral leg  pain, but more so on the feet.   REVIEW OF SYSTEMS:  The review of systems is otherwise negative.   PAST MEDICAL  HISTORY:  1.  Includes hypertension.  2.  Diabetes.  3.  Hyperlipidemia.  4.  Hypothyroidism.  5.  Obesity.   MEDICATIONS:  1.  The patient is on Lantus 70 units subcu q.h.s.  2.  Diovan plus HCT 160/12.5 p.o. daily.  3.  Humalog 20 mg subcu t.i.d. with meals.  4.  Actos 45 p.o. daily.  5.  Crestor 20 p.o. daily.  6.  Synthroid 150 mcg p.o. daily.  7.  Lasix 60 p.o. daily.  8.  Aspirin 81 p.o. daily.  9.  Percocet 10/325 p.o. b.i.d.  10. He also is on Requip 2 mg p.o. q.h.s.   ALLERGIES:  The patient has no known drug allergies.   SOCIAL HISTORY:  He denies any tobacco, alcohol, or drug use.  He lives with  his wife.   FAMILY HISTORY:  Noncontributory.   PHYSICAL EXAMINATION:  VITAL SIGNS:  The patient's vitals on admission,  temperature 96.6, heart rate 85, blood pressure  147/76, respirations 16, O2  saturation 96% on room air.  GENERAL:  In general the patient is alert and oriented x3 in no apparent  distress.  HEENT:  Normocephalic, atraumatic.  His mucous membranes are dry.  He has a  very narrow airway.  HEART:  Regular rate and rhythm.  Soft, S1-S2.  LUNGS:  Clear to auscultation bilaterally.  ABDOMEN:  Soft, obese, nontender, positive bowel sounds.  EXTREMITIES:  Show no clubbing or cyanosis.  He has signs of  peripheralneuropathy with smooth skin, no hair, and poor peripheral pulses  and about a 2+ pitting edema from about the lower calf down.   LAB WORK:  Sodium 134, potassium 4.6, chloride 102, bicarb 23, BUN 113,  creatinine 2.6, glucose 198.  White count 7.7, 84%shift hemoglobin 10.5 and  hematocrit 38.7,  D-dimer is elevated at 2.11.   ASSESSMENT AND PLAN:  1.  Acute renal failure.  Most likely this is from the patient being over      diuresed with Lasix as well as an ACE inhibitor. We will put these      medications on hold as well as his aspirin and gently hydrate.  2.  Lower extremity swelling.  This does not appear to be secondary to      congestive  heart failure; it may be venous stasis versus possible gout.      We will check a serum uric acid level versus full protein stores.  We      will check LFTs for an albumin level and see if he is third spacing.  3.  Diabetes mellitus.  We will check a hemoglobin A1c, continue his      medication and  4.  Hypertension, only if antihypertensive.  5.  Hypothyroidism.  Continue Synthroid check a TSH.  6.  Morbid obesity.  7.  Deconditioning.  Will check with PT consult.      Hollice Espy, M.D.  Electronically Signed     SKK/MEDQ  D:  06/19/2005  T:  06/19/2005  Job:  [id number]   cc:   Leanne Chang, M.D.  Fax: [phone]

## 2010-07-20 NOTE — Discharge Summary (Signed)
[name], ROBSON NO.:  [id number]   MEDICAL RECORD NO.:  [id number]          PATIENT TYPE:  INP   LOCATION:  1514                         FACILITY:  [facility]   PHYSICIAN:  Rosalyn Gess. Norins, MD  DATE OF BIRTH:  09/28/1940   DATE OF ADMISSION:  04/16/2006  DATE OF DISCHARGE:  04/19/2006                               DISCHARGE SUMMARY   ADMISSION DIAGNOSES:  1. Left diabetic foot wound.  2. Lower extremity edema.  3. Diabetes.  4. Morbid obesity.   DISCHARGE DIAGNOSES:  1. Left diabetic foot wound.  2. Lower extremity edema.  3. Diabetes.  4. Morbid obesity.   CONSULTANTS:  Vanita Panda. Magnus Ivan, M.D., orthopedics.   PROCEDURE:  1. Bedside debridement of foot wound.  2. Plain x-ray of the foot which revealed inflammation of the tissue      and soft tissue swelling with no bony abnormality.   HISTORY OF PRESENT ILLNESS:  The patient is a 70 year old type 2  diabetic admitted with a diabetic foot.  He did not notice a problem  until Sunday when he took off his sock and had an automatic debridement  revealing a vast area of redness along the bottom of his foot.  He saw  Dr. Blossom Hoops in the office and was sent to the Wound Center at [facility] where he was subsequently referred to the emergency department for  admission.  He had received IV vancomycin at the Wound Center.   Please see H&P for past medical history, family history, social history,  and medications.   HOSPITAL COURSE:  1. Diabetic foot. The patient had a large wound on his foot with      debridement of the superficial dermis across the entire plantar      aspect of the foot extending approximately to 2 1/2 inches below      the MCP joint.  He also had involvement with blistering of the      fifth and fourth digit.  The patient was seen in consultation by      Dr. Magnus Ivan.  The patient did come to bedside debridement.  Dr.      Magnus Ivan felt that there was robust granulation tissue.   He did not      feel an MRI was indicated.  Following debridement, he dressed the      wound with Silvadene and recommended the patient have wet to dry      dressings on a daily basis afterwards.  Dr. Magnus Ivan fully      instructed the patient and his wife, by his note, and they had full      understanding of the plan.  With the patient's wound being debrided      with no need for further intervention, he is felt to be stable and      ready for discharge home.   1. Diabetes.  The patient had a hemoglobin A1c of 6.6% indicating      adequate control.   With the patient's with foot wound being evaluated, treated, and ready  for discharge, the patient himself is  stable for discharge.   DISCHARGE EXAM:  Temperature 97.8, blood pressure 109/62, pulse 74,  respirations 20, O2 sats 94% on room air.  CBG was 149.  General  Appearance:  This is a massively obese Caucasian male lying in bed in no  acute distress.  There is no increased work of respiration or other  signs of distress. His foot is dressed and no further examination  conducted.   FINAL LABORATORY:  April 16, 2006, his hemoglobin was 12.2 grams,  white count at admission was 8200.  Chemistries at admission revealed a  sodium of 131, glucose of 139, BUN of 17, creatinine 1.44.  Liver  functions were normal.  Albumin was normal at 3.7, calcium was normal.  Hemoglobin A1c was 6.6%.  Sedimentation rate was 68.  TSH was 7.034.   DISPOSITION:  The patient is discharged home.  He has instructions for  wound care.  He is to follow up Dr. Aldean Baker in one week.   DISCHARGE MEDICATIONS:  The patient will resume all his home medications  including Lantus 70 units q.h.s., Diovan/HCT 160/12.5 mg daily, Humalog  20 units before meals, Actos 45 mg daily, Crestor 20 mg daily, Synthroid  150 mcg daily, Lasix 60 mg daily, aspirin 81 mg daily, Percocet 10/650  q.i.d. p.r.n.  Will add Augmentin 875 mg b.i.d. for an additional seven  days.    FOLLOW UP:  The patient is instructed to call Dr. Laqueta Linden office for  an appointment for follow-up in 7-10 days.   The patient's condition at the time of discharge dictation is stable and  improved.      Rosalyn Gess Norins, MD  Electronically Signed     MEN/MEDQ  D:  04/19/2006  T:  04/19/2006  Job:  [id number]   cc:   Nadara Mustard, MD  Fax: [phone]   Leanne Chang, M.D.  Fax: [phone]

## 2010-07-20 NOTE — Assessment & Plan Note (Signed)
[facility]                        [place] OFFICE NOTE   NAME:Rabelo, Lamere                       MRN:          [id number]  DATE:04/16/2006                            DOB:          Mar 15, 1940    CONTINUATION   Pedal pulses were absent.  There was 1+ pedal edema which is baseline  for patient.   IMPRESSION:  A 4x5 inch open wound on the plantar aspect of the right  foot in a type 2 diabetic with peripheral neuropathy.   PLAN:  Given the impressive nature of the wound I felt it was in the  patient's best interest, to decrease the risk of severe morbidity and  mortality to be evaluated by a wound specialist.  I personally spoke to  Dr. Tanda Rockers at the wound center and he agreed to se Mr. Pile this  afternoon.   A wet-to-dry dressing was placed and patient was transferred there by  his wife.  Due to the type of insurance patient had, a preauthorization  was required and according to the reviewer at [facility], i.e.,  Mindi Junker, she states that she had 48 hours to review the case and there  appeared to be some question of requiring a 30 day outpatient treatment  for the wound.  I spoke to my nurse who relayed the information to me  and therefore I went ahead and called [employer] again, spoke to [place],  and advised her of my concerns.  She had Dr. Signa Kell return my call.  I  explained to him in details the wound and the comorbidities the patient  had.  He did agree that the patient, based on my assessment required  immediate attention.  Unfortunately due to the time span with this  telephonic discussion the wound center could not treat the patient due  to the delay of the [facility] Medicare process.  The patient was then  sent to the emergency department for further treatment and will be seen  at [facility] ER.  I advised this to Dr. Signa Kell and he states that as  soon as the patient is discharged, and if he does require  outpatient  wound care, for [place] to call back to get it approved.  [employer]  was made aware of my concerns in avoiding significant complications in a  high risk patient and hopefully this discussion with Dr. Signa Kell will  expedite the outpatient treatment after patient is treated in the  emergency department.     Leanne Chang, M.D.     LA/MedQ  DD: 04/16/2006  DT: 04/16/2006  Job #: [id number]

## 2010-07-20 NOTE — Assessment & Plan Note (Signed)
[facility]                        [place] OFFICE NOTE   CONRAD, ZAJKOWSKI                       MRN:          [id number]  DATE:04/16/2006                            DOB:          [birth year]    REASON FOR VISIT:  Skin off right foot.   Mr. Weide is a 70 year old male with type 2 diabetes, renal  insufficiency, and diabetic neuropathy.  He reports that after coming  back from [place] on Saturday he took of his shoes and socks and his  skin came off the plantar aspect of his right foot.  He reports that  there was no significant pain.  His wife has been dressing it but has  not applied any ointment.  It has been draining blood.  Patient denied  any trauma.  The only change in activity was a drive from [place]  earlier that day.   PAST MEDICAL HISTORY:  1. Type 2 diabetes.  2. Hypertension.  3. Hyperlipidemia.  4. Diabetic neuropathy.  5. B-12 deficiency.  6. Gout.  7. Renal insufficiency.   MEDICATIONS:  1. Lantus 70 units nightly.  2. Humalog 20 units with meals.  3. Aspirin 81 mg.  4. Lasix 20 mg.  5. Prozac 20 mg b.i.d.  6. Levothyroxine 0.125 mg.  7. Actos 45 mg.  8. Colchicine 0.6 mg three tablets daily.  9. Requip 2 mg nightly.  10.Crestor 40 mg daily.   ALLERGIES:  NO KNOWN DRUG ALLERGIES.   OBJECTIVE:  Weight unable to obtain secondary to the injury of the right  foot.  Examination of the right foot is significant for a very  impressive wound which is located on the plantar aspect of the foot.  It  is completely denuded of the skin in a 4 x 5 inch in diameter area from  the ball of the foot and to the base of the toes.  No purulent discharge  noted.   Pedal pulses were absent.  There was 2+ pedal edema which is (1+baseline  for patient.}   IMPRESSION:  A 4x5 inch open wound on the plantar aspect of the right  foot in a type 2 diabetic with peripheral neuropathy.   PLAN:  Given the impressive nature of the  wound I felt it was in the  patient's best interest, to decrease the risk of severe morbidity and  mortality to be evaluated by a wound specialist.  I personally spoke to  Dr. Tanda Rockers at the wound center and he agreed to see Mr. Pauley this  afternoon.   A wet-to-dry dressing was placed and patient was transferred there by  his wife.  Due to the type of insurance patient had, a preauthorization  was required and according to the reviewer at [facility], i.e.,  Mindi Junker, she states that she had 48 hours to review the case and there  appeared to be some question of requiring a 30 day outpatient treatment  for the wound.  I spoke to my nurse who relayed the above information to  me and therefore I went ahead and called [employer] again, spoke to  [place],  and advised her of my concerns.  She had Dr. Signa Kell return my  call.  I explained to him in details the wound and the comorbidities the  patient had.  He did agree that the patient, based on my assessment  required immediate attention.  Unfortunately due the delay in obtaining  expeditious approval, the wound center could not treat the patient.  The  patient was then sent to the emergency department for further treatment  and will be seen at [facility] ER.  I advised this to Dr. Signa Kell and  he states that as soon as the patient is discharged, and if he does  require outpatient wound care, for [place] to call back to get it approved.  [employer] was made aware of my concerns in avoiding significant  complications in a high risk patient and hopefully this discussion with  Dr. Signa Kell will expedite the outpatient treatment if the patient is  discharge from the E.D. There is a high possibly of admission when E.D.  physician sees the wound given patient was sent for the Wound Clinic.     Leanne Chang, M.D.  Electronically Signed    LA/MedQ  DD: 04/16/2006  DT: 04/16/2006  Job #: [id number]

## 2010-07-20 NOTE — Op Note (Signed)
[place]  Patient:    [name], [name]                    MRN: [id number] Proc. Date: 11/15/99 Adm. Date:  [id number] Disc. 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  PROCEDURE:  Left knee arthroscopy with debridement, abrasion chondroplasty, medial femoral condyle, lateral femoral condyle, trochlea and patella with partial medial and lateral meniscectomy and removal of lose bodies.  SURGEON:  Nadara Mustard, M.D.  ANESTHESIA:  Knee block.  ESTIMATED BLOOD LOSS:  Minimal.  ANTIBIOTICS:  None.  DRAINS:  None.  COMPLICATIONS:  None.  DISPOSITION:  To PACU in stable condition.  INDICATIONS FOR PROCEDURE:  The patient was a 69 year old gentleman, type 2 diabetic with severe osteoarthritis and internal derangement of his left knee. The patient has had mechanical symptoms, failed conservative care with injections, and anti-inflammatories and presents at this time for arthroscopic intervention.  The risks and benefits were discussed including infection, neurovascular injury, persistent pain, need for additional surgery, and need for knee replacement.  The patient states he understands and wishes to proceed at this time.  DESCRIPTION OF PROCEDURE:  The patient was brought to outpatient room #6 after undergoing a knee block.  After adequate level of anesthesia was obtained the patients left lower extremity was prepped using DuraPrep and draped in a sterile field.  The scope was inserted through the inferior lateral portal. Visualization showed significant eburnated bone on the trochlea, essentially a hard eburnated bone with no cartilage with grade 3 to grade 4 chondromalacia of the patella.   Visualization of the medial joint line showed a degenerative meniscal tear with grade 4 chondromalacia of the medial femoral condyle and tibia.  Instruments were inserted through the inferior medial portal and abrasion chondroplasty was performed on the femur and tibia.  A partial medial meniscectomy was also performed.  Visualization of the notch showed an intact ACL.  Visualization of the lateral joint line again showed degenerative lateral meniscal tears and these were also debrided as well as abrasion chondroplasty.  Visualization of the medial and lateral gutters showed no lose bodies.  Visualization of the patellofemoral joint showed the grade IV chondromalacia and the patella was debrided as well as the trochlea.  A further survey of all 3 compartments was then performed.  There were no lose bodies.  The instruments were removed.  The joint was infused with 20 cc of 0.50% Marcaine plane and 4 mg of morphine.  The wound was covered with Adaptic, orthopedic sponges, sterile Webril and an Ace wrap from toes to thigh.  The patient was extubated, taken to PACU in stable condition.  PLAN:  Follow up in the office in 1-2 weeks, touch down weightbearing on the left. DD:  11/15/99 TD:  11/17/99 Job: [id number]

## 2010-07-26 ENCOUNTER — Other Ambulatory Visit: Payer: Self-pay | Admitting: Family Medicine

## 2010-07-28 ENCOUNTER — Other Ambulatory Visit: Payer: Self-pay | Admitting: Family Medicine

## 2010-07-31 NOTE — Telephone Encounter (Signed)
Last seen 01/11/10 and filled 05/17/10 please advise     KP

## 2010-07-31 NOTE — Telephone Encounter (Signed)
Faxed.   KP 

## 2010-08-01 ENCOUNTER — Other Ambulatory Visit: Payer: Self-pay | Admitting: Family Medicine

## 2010-08-02 ENCOUNTER — Other Ambulatory Visit: Payer: Self-pay | Admitting: Family Medicine

## 2010-08-03 ENCOUNTER — Other Ambulatory Visit: Payer: Self-pay | Admitting: Family Medicine

## 2010-08-06 ENCOUNTER — Encounter: Payer: Self-pay | Admitting: Family Medicine

## 2010-08-06 ENCOUNTER — Ambulatory Visit (INDEPENDENT_AMBULATORY_CARE_PROVIDER_SITE_OTHER): Payer: Medicare Other | Admitting: Family Medicine

## 2010-08-06 DIAGNOSIS — I10 Essential (primary) hypertension: Secondary | ICD-10-CM

## 2010-08-06 DIAGNOSIS — D518 Other vitamin B12 deficiency anemias: Secondary | ICD-10-CM

## 2010-08-06 DIAGNOSIS — D510 Vitamin B12 deficiency anemia due to intrinsic factor deficiency: Secondary | ICD-10-CM

## 2010-08-06 DIAGNOSIS — E119 Type 2 diabetes mellitus without complications: Secondary | ICD-10-CM

## 2010-08-06 DIAGNOSIS — E785 Hyperlipidemia, unspecified: Secondary | ICD-10-CM

## 2010-08-06 DIAGNOSIS — D519 Vitamin B12 deficiency anemia, unspecified: Secondary | ICD-10-CM

## 2010-08-06 DIAGNOSIS — Z7901 Long term (current) use of anticoagulants: Secondary | ICD-10-CM

## 2010-08-06 DIAGNOSIS — E039 Hypothyroidism, unspecified: Secondary | ICD-10-CM

## 2010-08-06 LAB — LIPID PANEL
HDL: 36.8 mg/dL — ABNORMAL LOW (ref >39.00)
Total CHOL/HDL Ratio: 4

## 2010-08-06 LAB — CBC WITH DIFFERENTIAL/PLATELET
Basophils Absolute: 0 10*3/uL (ref 0.0–0.1)
Basophils Relative: 0.3 % (ref 0.0–3.0)
HCT: 36 % — ABNORMAL LOW (ref 39.0–52.0)
Hemoglobin: 12.1 g/dL — ABNORMAL LOW (ref 13.0–17.0)
Lymphocytes Relative: 14.2 % (ref 12.0–46.0)
Lymphs Abs: 0.9 10*3/uL (ref 0.7–4.0)
Monocytes Relative: 5.9 % (ref 3.0–12.0)
Neutro Abs: 4.9 10*3/uL (ref 1.4–7.7)
RBC: 3.79 Mil/uL — ABNORMAL LOW (ref 4.22–5.81)
RDW: 15.2 % — ABNORMAL HIGH (ref 11.5–14.6)

## 2010-08-06 LAB — BASIC METABOLIC PANEL
CO2: 28 mEq/L (ref 19–32)
GFR: 38.46 mL/min — ABNORMAL LOW (ref >60.00)
Glucose, Bld: 162 mg/dL — ABNORMAL HIGH (ref 70–99)
Potassium: 4.2 mEq/L (ref 3.5–5.1)
Sodium: 139 mEq/L (ref 135–145)

## 2010-08-06 LAB — HEPATIC FUNCTION PANEL
ALT: 15 U/L (ref 0–53)
AST: 21 U/L (ref 0–37)
Albumin: 4 g/dL (ref 3.5–5.2)
Alkaline Phosphatase: 34 U/L — ABNORMAL LOW (ref 39–117)
Bilirubin, Direct: 0.2 mg/dL (ref 0.0–0.3)
Total Protein: 7.5 g/dL (ref 6.0–8.3)

## 2010-08-06 LAB — POCT URINALYSIS DIPSTICK
Ketones, UA: NEGATIVE
Leukocytes, UA: NEGATIVE
Nitrite, UA: NEGATIVE
Protein, UA: NEGATIVE
Urobilinogen, UA: 0.2
pH, UA: 6

## 2010-08-06 LAB — VITAMIN B12: Vitamin B-12: >1500 pg/mL — ABNORMAL HIGH (ref 211–911)

## 2010-08-06 LAB — MICROALBUMIN / CREATININE URINE RATIO
Creatinine,U: 37.5 mg/dL
Microalb Creat Ratio: 4.5 mg/g (ref 0.0–30.0)
Microalb, Ur: 1.7 mg/dL (ref 0.0–1.9)

## 2010-08-06 LAB — POCT INR: INR: 2.5

## 2010-08-06 MED ORDER — CYANOCOBALAMIN 1000 MCG/ML IJ SOLN
1000.0000 ug | Freq: Once | INTRAMUSCULAR | Status: AC
  Administered 2010-08-06: 1000 ug via INTRAMUSCULAR

## 2010-08-06 MED ORDER — CEPHALEXIN 500 MG PO CAPS: 500.0000 mg | ORAL_CAPSULE | Freq: Four times a day (QID) | ORAL | Status: AC

## 2010-08-06 NOTE — Assessment & Plan Note (Signed)
Check labs con't meds 

## 2010-08-06 NOTE — Assessment & Plan Note (Signed)
con't meds  Check labs 

## 2010-08-06 NOTE — Assessment & Plan Note (Signed)
Check labs b12 given today 

## 2010-08-06 NOTE — Patient Instructions (Signed)
Diabetes Meal Planning Guide The diabetes meal planning guide is a tool to help you plan your meals and snacks. It is important for people with diabetes to manage their blood sugar levels. Choosing the right foods and the right amounts throughout your day will help control your blood sugar. Eating right can even help you improve your blood pressure and reach or maintain a healthy weight. CARBOHYDRATE COUNTING MADE EASY When you eat carbohydrates, they turn to sugar (glucose). This raises your blood sugar level. Counting carbohydrates can help you control this level so you feel better. When you plan your meals by counting carbohydrates, you can have more flexibility in what you eat and balance your medicine with your food intake. Carbohydrate counting simply means adding up the total amount of carbohydrate grams (g) in your meals or snacks. Try to eat about the same amount at each meal. Foods with carbohydrates are listed below. Each portion below is 1 carbohydrate serving or 15 grams of carbohydrates. Ask your dietician how many grams of carbohydrates you should eat at each meal or snack. Grains and Starches 1 slice bread 1/2 English muffin or hotdog/hamburger bun 3/4 cup cold cereal (unsweetened) 1/3 cup cooked pasta or rice 1/2 cup starchy vegetables (corn, potatoes, peas, beans, winter squash) 1 tortilla (6 inches) 1/4 bagel 1 waffle or pancake (size of a CD) 1/2 cup cooked cereal 4 to 6 small crackers *Whole grain is recommended Fruit 1 cup fresh unsweetened berries, melon, papaya, pineapple 1 small fresh fruit 1/2 banana or mango 1/2 cup fruit juice (4 ounces unsweetened) 1/2 cup canned fruit in natural juice or water 2 tablespoons dried fruit 12 to 15 grapes or cherries Milk and Yogurt 1 cup fat-free or 1% milk 1 cup soy milk 6 ounces light yogurt with sugar-free sweetener 6 ounces low-fat soy yogurt 6 ounces plain yogurt Vegetables 1 cup raw or 1/2 cup cooked is counted as 0  carbohydrates or a "free" food. If you eat 3 or more servings at one meal, count them as 1 carbohydrate serving. Other Carbohydrates 3/4 ounces chips or pretzels 1/2 cup ice cream or frozen yogurt 1/4 cup sherbet or sorbet 2 inch square cake, no frosting 1 tablespoon honey, sugar, jam, jelly, or syrup 2 small cookies 3 squares of graham crackers 3 cups popcorn 6 crackers 1 cup broth-based soup Count 1 cup casserole or other mixed foods as 2 carbohydrate servings. Foods with less than 20 calories in a serving may be counted as 0 carbohydrates or a "free" food. You may want to purchase a book or computer software that lists the carbohydrate gram counts of different foods. In addition, the nutrition facts panel on the labels of the foods you eat are a good source of this information. The label will tell you how big the serving size is and the total number of carbohydrate grams you will be eating per serving. Divide this number by 15 to obtain the number of carbohydrate servings in a portion. Remember: 1 carbohydrate serving equals 15 grams of carbohydrate. SERVING SIZES Measuring foods and serving sizes helps you make sure you are getting the right amount of food. The list below tells how big or small some common serving sizes are.  1 ounce (oz) of cheese.................................4 stacked dice.   2 to 3 oz cooked meat..................................Deck of cards.   1 teaspoon (tsp)............................................Tip of little finger.   1 tablespoon (tbs).........................................Thumb.   2 tbs.............................................................Golf ball.    cup...........................................................Half of a fist.   1 cup............................................................A fist.    SAMPLE DIABETES MEAL PLAN Below is a sample meal plan that includes foods from the grain and starches, dairy, vegetable, fruit, and  meat groups. A dietician can individualize a meal plan to fit your calorie needs and tell you the number of servings needed from each food group. However, controlling the total amount of carbohydrates in your meal or snack is more important than making sure you include all of the food groups at every meal. You may interchange carbohydrate containing foods (dairy, starches, and fruits). The meal plan below is an example of a 2000 calorie diet using carbohydrate counting. This meal plan has 17 carbohydrate servings (carb choices). Breakfast 1 cup oatmeal (2 carb choices) 3/4 cup light yogurt (1 carb choice) 1 cup blueberries (1 carb choice) 1/4 cup almonds  Snack 1 large apple (2 carb choices) 1 low-fat string cheese stick  Lunch Chicken breast salad:  1 cup spinach   1/4 cup chopped tomatoes   2 oz chicken breast, sliced   2 tbs low-fat Italian dressing  12 whole-wheat crackers (2 carb choices) 12 to 15 grapes (1 carb choice) 1 cup low-fat milk (1 carb choice)  Snack 1 cup carrots 1/2 cup hummus (1 carb choice)  Dinner 3 oz broiled salmon 1 cup brown rice (3 carb choices)  Snack 1 1/2 cups steamed broccoli (1 carb choice) drizzled with 1 tsp olive oil and lemon juice 1 cup light pudding (2 carb choices)  DIABETES MEAL PLANNING WORKSHEET Your dietician can use this worksheet to help you decide how many servings of foods and what types of foods are right for you.  Breakfast Food Group and Servings Carb Choices Grain/Starches _______________________________________ Dairy ______________________________________________ Vegetable _______________________________________ Fruit _______________________________________________ Meat _______________________________________________ Fat _____________________________________________ Lunch Food Group and Servings Carb Choices Grain/Starches ________________________________________ Dairy _______________________________________________ Fruit  ________________________________________________ Meat ________________________________________________ Fat _____________________________________________ Dinner Food Group and Servings Carb Choices Grain/Starches ________________________________________ Dairy _______________________________________________ Fruit ________________________________________________ Meat ________________________________________________ Fat _____________________________________________ Snacks Food Group and Servings Carb Choices Grain/Starches ________________________________________ Dairy _______________________________________________ Vegetable ________________________________________ Fruit ________________________________________________ Meat ________________________________________________ Fat _____________________________________________ Daily Totals Starches _________________________ Vegetable __________________________ Fruit ______________________________ Dairy ______________________________ Meat ______________________________ Fat ________________________________  Document Released: 11/15/2004 Document Re-Released: 08/08/2009 [employer] Patient Information 2011 [employer]. 

## 2010-08-06 NOTE — Progress Notes (Signed)
  Subjective:    Patient ID: [name], male    DOB: Mar 24, 1940, 70 y.o.   MRN: [id number]  HPI HYPERTENSION Disease Monitoring Blood pressure range-not checked at home Chest pain- no      Dyspnea- no Medications Compliance- good Lightheadedness- no   Edema- yes   DIABETES Disease Monitoring Blood Sugar ranges-100-120 Polyuria- no New Visual problems- no Medications Compliance- fair Hypoglycemic symptoms- no   HYPERLIPIDEMIA Disease Monitoring See symptoms for Hypertension Medications Compliance- good RUQ pain- no  Muscle aches- no  ROS See HPI above   PMH Smoking Status noted    Review of Systems As above    Objective:   Physical Exam  Constitutional: He is oriented to person, place, and time. He appears well-developed and well-nourished.  Cardiovascular: Normal rate, regular rhythm and normal heart sounds.   Pulmonary/Chest: Effort normal and breath sounds normal.  Musculoskeletal:       R BKA  Neurological: He is alert and oriented to person, place, and time.  Psychiatric: He has a normal mood and affect. His behavior is normal. Judgment and thought content normal.          Assessment & Plan:

## 2010-08-07 LAB — TSH: TSH: 2.06 u[IU]/mL (ref 0.35–5.50)

## 2010-08-08 ENCOUNTER — Other Ambulatory Visit: Payer: Self-pay | Admitting: Family Medicine

## 2010-08-17 ENCOUNTER — Other Ambulatory Visit: Payer: Self-pay | Admitting: Family Medicine

## 2010-08-30 ENCOUNTER — Other Ambulatory Visit: Payer: Self-pay | Admitting: Family Medicine

## 2010-08-31 ENCOUNTER — Other Ambulatory Visit: Payer: Self-pay | Admitting: Family Medicine

## 2010-08-31 ENCOUNTER — Encounter: Payer: Self-pay | Admitting: Family Medicine

## 2010-08-31 ENCOUNTER — Ambulatory Visit (INDEPENDENT_AMBULATORY_CARE_PROVIDER_SITE_OTHER): Payer: Medicare Other | Admitting: Family Medicine

## 2010-08-31 DIAGNOSIS — N400 Enlarged prostate without lower urinary tract symptoms: Secondary | ICD-10-CM

## 2010-08-31 DIAGNOSIS — I4891 Unspecified atrial fibrillation: Secondary | ICD-10-CM

## 2010-08-31 DIAGNOSIS — E119 Type 2 diabetes mellitus without complications: Secondary | ICD-10-CM

## 2010-08-31 DIAGNOSIS — L0390 Cellulitis, unspecified: Secondary | ICD-10-CM

## 2010-08-31 DIAGNOSIS — E538 Deficiency of other specified B group vitamins: Secondary | ICD-10-CM

## 2010-08-31 DIAGNOSIS — L0291 Cutaneous abscess, unspecified: Secondary | ICD-10-CM

## 2010-08-31 DIAGNOSIS — G2581 Restless legs syndrome: Secondary | ICD-10-CM

## 2010-08-31 DIAGNOSIS — I469 Cardiac arrest, cause unspecified: Secondary | ICD-10-CM

## 2010-08-31 MED ORDER — CEPHALEXIN 500 MG PO CAPS: 500.0000 mg | ORAL_CAPSULE | Freq: Two times a day (BID) | ORAL | Status: DC

## 2010-08-31 MED ORDER — WARFARIN SODIUM 5 MG PO TABS: 5.0000 mg | ORAL_TABLET | Freq: Every day | ORAL | Status: DC

## 2010-08-31 MED ORDER — ROPINIROLE HCL 2 MG PO TABS: 2.0000 mg | ORAL_TABLET | Freq: Every day | ORAL | Status: DC

## 2010-08-31 MED ORDER — TAMSULOSIN HCL 0.4 MG PO CAPS: ORAL_CAPSULE | ORAL | Status: DC

## 2010-08-31 NOTE — Assessment & Plan Note (Signed)
con't keflex for 10-14 days

## 2010-08-31 NOTE — Progress Notes (Signed)
  Subjective:    Patient ID: [name], male    DOB: Aug 16, 1940, 70 y.o.   MRN: [id number]  HPI  Pt here f/u cellulitis.  It is much better but they think they may need more abx.  Here to discuss b12 as well.  No new complaints.    Review of Systems As above    Objective:   Physical Exam  Constitutional: He appears well-developed and well-nourished.  Skin: Skin is warm and dry. No rash noted. There is erythema. No pallor.       L low ext---cellulitis much better.  Still slightly errythematous and warm to touch +tr pitting edema          Assessment & Plan:

## 2010-08-31 NOTE — Assessment & Plan Note (Signed)
Can stop b12 injections and take b12 sublingual Recheck 3 months

## 2010-09-01 ENCOUNTER — Other Ambulatory Visit: Payer: Self-pay | Admitting: Family Medicine

## 2010-09-01 NOTE — Telephone Encounter (Signed)
Refill sent, pt was just seen in office.

## 2010-09-07 ENCOUNTER — Other Ambulatory Visit: Payer: Self-pay | Admitting: Family Medicine

## 2010-09-07 MED ORDER — LIRAGLUTIDE 18 MG/3ML ~~LOC~~ SOLN: SUBCUTANEOUS | Status: DC

## 2010-10-05 ENCOUNTER — Other Ambulatory Visit: Payer: Self-pay | Admitting: Family Medicine

## 2010-10-05 NOTE — Telephone Encounter (Signed)
Ok but mark q 6 hrs PRN only

## 2010-10-05 NOTE — Telephone Encounter (Signed)
Last seen 08/31/10 and filled 08/30/10 please advise    KP

## 2010-10-22 ENCOUNTER — Other Ambulatory Visit: Payer: Self-pay | Admitting: Family Medicine

## 2010-10-22 NOTE — Telephone Encounter (Signed)
Discussed with pharmacy and Rx was sent in error    KP

## 2010-10-30 ENCOUNTER — Other Ambulatory Visit: Payer: Self-pay | Admitting: Internal Medicine

## 2010-10-30 NOTE — Telephone Encounter (Signed)
Dr. Lowne's patient 

## 2010-11-12 ENCOUNTER — Other Ambulatory Visit: Payer: Self-pay | Admitting: Family Medicine

## 2010-11-27 ENCOUNTER — Other Ambulatory Visit: Payer: Medicare Other

## 2010-11-28 ENCOUNTER — Other Ambulatory Visit: Payer: Self-pay | Admitting: Family Medicine

## 2010-11-28 LAB — POCT I-STAT 3, ART BLOOD GAS (G3+)
Bicarbonate: 24.4 — ABNORMAL HIGH
TCO2: 26
pCO2 arterial: 45.3 — ABNORMAL HIGH
pH, Arterial: 7.341 — ABNORMAL LOW
pO2, Arterial: 106 — ABNORMAL HIGH

## 2010-11-28 LAB — RENAL FUNCTION PANEL
Albumin: 1.9 — ABNORMAL LOW
Albumin: 1.8 — ABNORMAL LOW
Albumin: 1.8 — ABNORMAL LOW
Albumin: 1.9 — ABNORMAL LOW
Albumin: 1.9 — ABNORMAL LOW
Albumin: 1.9 — ABNORMAL LOW
Albumin: 2 — ABNORMAL LOW
Albumin: 2 — ABNORMAL LOW
Albumin: 2 — ABNORMAL LOW
BUN: 52 — ABNORMAL HIGH
BUN: 38 — ABNORMAL HIGH
BUN: 48 — ABNORMAL HIGH
BUN: 49 — ABNORMAL HIGH
BUN: 51 — ABNORMAL HIGH
BUN: 60 — ABNORMAL HIGH
BUN: 61 — ABNORMAL HIGH
BUN: 64 — ABNORMAL HIGH
CO2: 25
CO2: 26
CO2: 27
CO2: 27
CO2: 27
CO2: 28
Calcium: 8 — ABNORMAL LOW
Calcium: 7.9 — ABNORMAL LOW
Calcium: 8 — ABNORMAL LOW
Calcium: 8.1 — ABNORMAL LOW
Calcium: 8.1 — ABNORMAL LOW
Calcium: 8.1 — ABNORMAL LOW
Calcium: 8.1 — ABNORMAL LOW
Calcium: 8.2 — ABNORMAL LOW
Calcium: 8.2 — ABNORMAL LOW
Calcium: 8.3 — ABNORMAL LOW
Calcium: 8.5
Calcium: 8.6
Chloride: 100
Chloride: 100
Chloride: 102
Chloride: 96
Chloride: 99
Creatinine, Ser: 6.85 — ABNORMAL HIGH
Creatinine, Ser: 6.87 — ABNORMAL HIGH
Creatinine, Ser: 7.09 — ABNORMAL HIGH
Creatinine, Ser: 7.18 — ABNORMAL HIGH
Creatinine, Ser: 7.81 — ABNORMAL HIGH
Creatinine, Ser: 8.26 — ABNORMAL HIGH
Creatinine, Ser: 8.33 — ABNORMAL HIGH
Creatinine, Ser: 8.44 — ABNORMAL HIGH
GFR calc Af Amer: 12 — ABNORMAL LOW
GFR calc Af Amer: 10 — ABNORMAL LOW
GFR calc Af Amer: 8 — ABNORMAL LOW
GFR calc Af Amer: 8 — ABNORMAL LOW
GFR calc Af Amer: 8 — ABNORMAL LOW
GFR calc Af Amer: 9 — ABNORMAL LOW
GFR calc Af Amer: 9 — ABNORMAL LOW
GFR calc non Af Amer: 6 — ABNORMAL LOW
GFR calc non Af Amer: 7 — ABNORMAL LOW
GFR calc non Af Amer: 8 — ABNORMAL LOW
GFR calc non Af Amer: 8 — ABNORMAL LOW
GFR calc non Af Amer: 8 — ABNORMAL LOW
Glucose, Bld: 93
Glucose, Bld: 107 — ABNORMAL HIGH
Glucose, Bld: 110 — ABNORMAL HIGH
Glucose, Bld: 114 — ABNORMAL HIGH
Glucose, Bld: 119 — ABNORMAL HIGH
Glucose, Bld: 129 — ABNORMAL HIGH
Glucose, Bld: 75
Glucose, Bld: 75
Phosphorus: 4.4
Phosphorus: 5.5 — ABNORMAL HIGH
Phosphorus: 4.6
Phosphorus: 5.5 — ABNORMAL HIGH
Phosphorus: 5.9 — ABNORMAL HIGH
Phosphorus: 6.2 — ABNORMAL HIGH
Phosphorus: 6.9 — ABNORMAL HIGH
Phosphorus: 7.6 — ABNORMAL HIGH
Phosphorus: 7.7 — ABNORMAL HIGH
Phosphorus: 7.7 — ABNORMAL HIGH
Phosphorus: 9.3 — ABNORMAL HIGH
Potassium: 3.9
Potassium: 3.6
Potassium: 3.7
Potassium: 4
Potassium: 4
Sodium: 137
Sodium: 140
Sodium: 135
Sodium: 136
Sodium: 136
Sodium: 137
Sodium: 137
Sodium: 138

## 2010-11-28 LAB — BLOOD GAS, ARTERIAL
Acid-base deficit: 8 — ABNORMAL HIGH
Bicarbonate: 18.3 — ABNORMAL LOW
Drawn by: 14023
O2 Content: 4
O2 Saturation: 99.2
Patient temperature: 98.6
TCO2: 19.7
TCO2: 25.2
pCO2 arterial: 47.6 — ABNORMAL HIGH
pCO2 arterial: 51.7 — ABNORMAL HIGH
pH, Arterial: 7.29 — ABNORMAL LOW
pH, Arterial: 7.321 — ABNORMAL LOW
pO2, Arterial: 209 — ABNORMAL HIGH
pO2, Arterial: 98

## 2010-11-28 LAB — DIFFERENTIAL
Band Neutrophils: 21 — ABNORMAL HIGH
Basophils Absolute: 0
Basophils Absolute: 0
Basophils Relative: 0
Basophils Relative: 0
Basophils Relative: 1
Eosinophils Absolute: 0
Eosinophils Absolute: 0.2
Eosinophils Absolute: 0.1
Eosinophils Relative: 0
Eosinophils Relative: 4
Eosinophils Relative: 1
Lymphocytes Relative: 10 — ABNORMAL LOW
Lymphocytes Relative: 18
Lymphs Abs: 0.8
Monocytes Absolute: 0.4
Monocytes Relative: 1 — ABNORMAL LOW
Monocytes Relative: 9
Neutrophils Relative %: 55
nRBC: 0

## 2010-11-28 LAB — BASIC METABOLIC PANEL
BUN: 85 — ABNORMAL HIGH
BUN: 62 — ABNORMAL HIGH
CO2: 28
CO2: 25
CO2: 26
Calcium: 7.5 — ABNORMAL LOW
Calcium: 9
Calcium: 8.2 — ABNORMAL LOW
Chloride: 95 — ABNORMAL LOW
Chloride: 98
Chloride: 104
Chloride: 98
Chloride: 98
Creatinine, Ser: 5.6 — ABNORMAL HIGH
Creatinine, Ser: 5.96 — ABNORMAL HIGH
Creatinine, Ser: 9.07 — ABNORMAL HIGH
GFR calc Af Amer: 10 — ABNORMAL LOW
GFR calc Af Amer: 12 — ABNORMAL LOW
GFR calc Af Amer: 58 — ABNORMAL LOW
GFR calc Af Amer: 16 — ABNORMAL LOW
GFR calc Af Amer: 7 — ABNORMAL LOW
GFR calc Af Amer: 9 — ABNORMAL LOW
GFR calc non Af Amer: 10 — ABNORMAL LOW
GFR calc non Af Amer: 48 — ABNORMAL LOW
GFR calc non Af Amer: 8 — ABNORMAL LOW
GFR calc non Af Amer: 9 — ABNORMAL LOW
GFR calc non Af Amer: 6 — ABNORMAL LOW
Glucose, Bld: 175 — ABNORMAL HIGH
Glucose, Bld: 203 — ABNORMAL HIGH
Glucose, Bld: 96
Potassium: 4.4
Potassium: 5
Potassium: 3.6
Potassium: 3.6
Sodium: 136
Sodium: 140
Sodium: 136
Sodium: 139

## 2010-11-28 LAB — CBC
HCT: 23.1 — ABNORMAL LOW
HCT: 26.2 — ABNORMAL LOW
HCT: 33.4 — ABNORMAL LOW
HCT: 22.5 — ABNORMAL LOW
HCT: 25.9 — ABNORMAL LOW
HCT: 26.1 — ABNORMAL LOW
HCT: 26.3 — ABNORMAL LOW
Hemoglobin: 11.2 — ABNORMAL LOW
Hemoglobin: 8.8 — ABNORMAL LOW
Hemoglobin: 9 — ABNORMAL LOW
MCHC: 34.4
MCHC: 34.5
MCHC: 33.3
MCHC: 34.1
MCHC: 34.5
MCHC: 34.7
MCV: 85.8
MCV: 86.2
MCV: 86.6
MCV: 86.6
MCV: 87.8
MCV: 88
MCV: 88.1
MCV: 88.1
MCV: 88.4
Platelets: 152
Platelets: 160
Platelets: 163
Platelets: 183
Platelets: 161
Platelets: 163
Platelets: 208
Platelets: 218
Platelets: 291
Platelets: 342
Platelets: 342
RBC: 2.66 — ABNORMAL LOW
RBC: 2.67 — ABNORMAL LOW
RBC: 2.68 — ABNORMAL LOW
RBC: 2.76 — ABNORMAL LOW
RBC: 3.85 — ABNORMAL LOW
RBC: 2.56 — ABNORMAL LOW
RBC: 2.77 — ABNORMAL LOW
RBC: 2.97 — ABNORMAL LOW
RDW: 16.9 — ABNORMAL HIGH
RDW: 17.2 — ABNORMAL HIGH
RDW: 17.3 — ABNORMAL HIGH
RDW: 17.3 — ABNORMAL HIGH
RDW: 17.3 — ABNORMAL HIGH
RDW: 17.4 — ABNORMAL HIGH
RDW: 17.9 — ABNORMAL HIGH
RDW: 18.4 — ABNORMAL HIGH
WBC: 4.9
WBC: 4.9
WBC: 5.1
WBC: 5.3
WBC: 7.1
WBC: 8.4
WBC: 6.9
WBC: 7.2
WBC: 8.1
WBC: 8.2
WBC: 8.2

## 2010-11-28 LAB — HEMOGLOBIN AND HEMATOCRIT, BLOOD
HCT: 24.9 — ABNORMAL LOW
Hemoglobin: 7.6 — CL
Hemoglobin: 8.5 — ABNORMAL LOW

## 2010-11-28 LAB — URINE CULTURE
Colony Count: NO GROWTH
Colony Count: >=100000
Culture: NO GROWTH

## 2010-11-28 LAB — TSH: TSH: 2.543

## 2010-11-28 LAB — CROSSMATCH

## 2010-11-28 LAB — URINALYSIS, ROUTINE W REFLEX MICROSCOPIC
Bilirubin Urine: NEGATIVE
Ketones, ur: NEGATIVE
Nitrite: NEGATIVE
Protein, ur: 100 — AB
Urobilinogen, UA: 1
pH: 7

## 2010-11-28 LAB — CALCIUM, IONIZED: Calcium, Ion: 0.97 — ABNORMAL LOW

## 2010-11-28 LAB — HEPATIC FUNCTION PANEL
ALT: 1915 — ABNORMAL HIGH
AST: 49 — ABNORMAL HIGH
Albumin: 2.4 — ABNORMAL LOW
Alkaline Phosphatase: 93
Bilirubin, Direct: 0.6 — ABNORMAL HIGH
Indirect Bilirubin: 0.8
Total Bilirubin: 1.3 — ABNORMAL HIGH
Total Bilirubin: 1.4 — ABNORMAL HIGH
Total Protein: 6.8

## 2010-11-28 LAB — B-NATRIURETIC PEPTIDE (CONVERTED LAB): Pro B Natriuretic peptide (BNP): 591 — ABNORMAL HIGH

## 2010-11-28 LAB — CULTURE, BLOOD (ROUTINE X 2)
Culture: NO GROWTH
Culture: NO GROWTH
Culture: NO GROWTH
Culture: NO GROWTH
Culture: NO GROWTH

## 2010-11-28 LAB — PROTIME-INR
INR: 1
INR: 1.2
Prothrombin Time: 13.4
Prothrombin Time: 15.7 — ABNORMAL HIGH
Prothrombin Time: 22.3 — ABNORMAL HIGH

## 2010-11-28 LAB — COMPREHENSIVE METABOLIC PANEL
ALT: 2646 — ABNORMAL HIGH
AST: 3441 — ABNORMAL HIGH
AST: 9615 — ABNORMAL HIGH
Albumin: 2.2 — ABNORMAL LOW
Alkaline Phosphatase: 105
CO2: 24
CO2: 26
Chloride: 101
Chloride: 96
Creatinine, Ser: 4.9 — ABNORMAL HIGH
GFR calc Af Amer: 10 — ABNORMAL LOW
GFR calc Af Amer: 14 — ABNORMAL LOW
GFR calc non Af Amer: 12 — ABNORMAL LOW
Potassium: 4.3
Total Bilirubin: 0.9
Total Bilirubin: 1.3 — ABNORMAL HIGH

## 2010-11-28 LAB — LACTATE DEHYDROGENASE
LDH: 7020 — ABNORMAL HIGH
LDH: 7277 — ABNORMAL HIGH

## 2010-11-28 LAB — SODIUM, URINE, RANDOM: Sodium, Ur: 33

## 2010-11-28 LAB — MAGNESIUM
Magnesium: 2.4
Magnesium: 2.4

## 2010-11-28 LAB — ACETAMINOPHEN LEVEL: Acetaminophen (Tylenol), Serum: <10 — ABNORMAL LOW

## 2010-11-28 LAB — APTT
aPTT: 31
aPTT: 36

## 2010-11-28 LAB — HEPARIN ANTI-XA: Heparin LMW: 0.18

## 2010-11-28 LAB — URINE MICROSCOPIC-ADD ON

## 2010-11-28 LAB — HEPATITIS PANEL, ACUTE
HCV Ab: NEGATIVE
Hep A IgM: NEGATIVE

## 2010-11-28 LAB — PREPARE FRESH FROZEN PLASMA

## 2010-11-28 LAB — HEPATITIS B SURFACE ANTIGEN: Hepatitis B Surface Ag: NEGATIVE

## 2010-11-28 LAB — CREATININE, URINE, RANDOM: Creatinine, Urine: 155.9

## 2010-11-28 LAB — CK TOTAL AND CKMB (NOT AT ARMC)
CK, MB: 12.1 — ABNORMAL HIGH
Total CK: 7402 — ABNORMAL HIGH
Total CK: 7917 — ABNORMAL HIGH

## 2010-11-28 LAB — AMYLASE: Amylase: 138 — ABNORMAL HIGH

## 2010-11-28 LAB — LIPASE, BLOOD: Lipase: 32

## 2010-11-28 LAB — ABO/RH: ABO/RH(D): O POS

## 2010-11-28 LAB — TROPONIN I: Troponin I: 2.13

## 2010-11-28 MED ORDER — ROSUVASTATIN CALCIUM 10 MG PO TABS: 10.0000 mg | ORAL_TABLET | Freq: Every day | ORAL | Status: DC

## 2010-11-28 NOTE — Telephone Encounter (Signed)
Addended by: Arnette Norris on: 11/28/2010 03:01 PM   Modules accepted: Orders

## 2010-11-29 LAB — CBC
HCT: 27.1 — ABNORMAL LOW
HCT: 29.3 — ABNORMAL LOW
HCT: 28.2 — ABNORMAL LOW
HCT: 29.5 — ABNORMAL LOW
HCT: 30 — ABNORMAL LOW
Hemoglobin: 10 — ABNORMAL LOW
Hemoglobin: 9.4 — ABNORMAL LOW
Hemoglobin: 10.3 — ABNORMAL LOW
Hemoglobin: 10.3 — ABNORMAL LOW
Hemoglobin: 10.4 — ABNORMAL LOW
Hemoglobin: 8.5 — ABNORMAL LOW
Hemoglobin: 9.1 — ABNORMAL LOW
Hemoglobin: 9.6 — ABNORMAL LOW
MCHC: 33.9
MCHC: 34
MCHC: 34.1
MCHC: 34.2
MCHC: 34.4
MCHC: 34.9
MCV: 88.5
MCV: 89.4
MCV: 89.8
MCV: 90.1
MCV: 90.6
Platelets: 187
Platelets: 211
Platelets: 296
Platelets: 300
RBC: 2.95 — ABNORMAL LOW
RBC: 3.07 — ABNORMAL LOW
RBC: 3.35 — ABNORMAL LOW
RBC: 2.77 — ABNORMAL LOW
RBC: 3.16 — ABNORMAL LOW
RBC: 3.21 — ABNORMAL LOW
RBC: 3.21 — ABNORMAL LOW
RBC: 3.28 — ABNORMAL LOW
RBC: 3.34 — ABNORMAL LOW
RBC: 3.39 — ABNORMAL LOW
RDW: 17.7 — ABNORMAL HIGH
RDW: 17.8 — ABNORMAL HIGH
RDW: 16.7 — ABNORMAL HIGH
RDW: 16.8 — ABNORMAL HIGH
RDW: 17.1 — ABNORMAL HIGH
RDW: 17.9 — ABNORMAL HIGH
WBC: 6
WBC: 7.5
WBC: 10.2
WBC: 7.3
WBC: 7.5
WBC: 7.6
WBC: 8.2

## 2010-11-29 LAB — URINALYSIS, ROUTINE W REFLEX MICROSCOPIC
Nitrite: NEGATIVE
Protein, ur: 30 — AB
Specific Gravity, Urine: 1.01
Urobilinogen, UA: 0.2

## 2010-11-29 LAB — DIFFERENTIAL
Basophils Absolute: 0
Basophils Absolute: 0.1
Basophils Absolute: 0
Basophils Relative: 1
Basophils Relative: 1
Eosinophils Absolute: 0.3
Eosinophils Absolute: 0.3
Eosinophils Relative: 3
Eosinophils Relative: 4
Lymphocytes Relative: 11 — ABNORMAL LOW
Lymphocytes Relative: 16
Lymphocytes Relative: 19
Lymphs Abs: 1
Monocytes Absolute: 1.1 — ABNORMAL HIGH
Monocytes Relative: 12
Monocytes Relative: 12
Monocytes Relative: 13 — ABNORMAL HIGH
Neutro Abs: 4
Neutro Abs: 6.3
Neutrophils Relative %: 66
Neutrophils Relative %: 72
Neutrophils Relative %: 70

## 2010-11-29 LAB — BASIC METABOLIC PANEL
BUN: 50 — ABNORMAL HIGH
BUN: 12
BUN: 15
BUN: 20
BUN: 24 — ABNORMAL HIGH
BUN: 31 — ABNORMAL HIGH
CO2: 21
CO2: 21
CO2: 21
CO2: 24
CO2: 24
CO2: 25
Calcium: 8.5
Calcium: 8.7
Calcium: 6.7 — ABNORMAL LOW
Calcium: 7.6 — ABNORMAL LOW
Calcium: 7.7 — ABNORMAL LOW
Calcium: 7.8 — ABNORMAL LOW
Calcium: 8.1 — ABNORMAL LOW
Chloride: 106
Chloride: 102
Chloride: 107
Chloride: 107
Chloride: 109
Chloride: 109
Chloride: 109
Creatinine, Ser: 1.83 — ABNORMAL HIGH
Creatinine, Ser: 1.29
Creatinine, Ser: 1.34
Creatinine, Ser: 1.4
Creatinine, Ser: 1.49
Creatinine, Ser: 1.9 — ABNORMAL HIGH
Creatinine, Ser: 3.53 — ABNORMAL HIGH
GFR calc Af Amer: 44 — ABNORMAL LOW
GFR calc Af Amer: 45 — ABNORMAL LOW
GFR calc Af Amer: 30 — ABNORMAL LOW
GFR calc Af Amer: >60
GFR calc Af Amer: >60
GFR calc Af Amer: >60
GFR calc Af Amer: >60
GFR calc Af Amer: >60
GFR calc non Af Amer: 37 — ABNORMAL LOW
GFR calc non Af Amer: 37 — ABNORMAL LOW
GFR calc non Af Amer: 17 — ABNORMAL LOW
GFR calc non Af Amer: 24 — ABNORMAL LOW
GFR calc non Af Amer: 35 — ABNORMAL LOW
GFR calc non Af Amer: 47 — ABNORMAL LOW
GFR calc non Af Amer: 54 — ABNORMAL LOW
Glucose, Bld: 103 — ABNORMAL HIGH
Glucose, Bld: 222 — ABNORMAL HIGH
Glucose, Bld: 170 — ABNORMAL HIGH
Glucose, Bld: 84
Glucose, Bld: 89
Potassium: 3.5
Potassium: 4.2
Potassium: 3.8
Potassium: 3.8
Potassium: 3.9
Potassium: 3.9
Potassium: 3.9
Sodium: 130 — ABNORMAL LOW
Sodium: 132 — ABNORMAL LOW
Sodium: 140
Sodium: 136
Sodium: 137
Sodium: 137
Sodium: 138
Sodium: 139
Sodium: 139

## 2010-11-29 LAB — RENAL FUNCTION PANEL
CO2: 22
Calcium: 7.8 — ABNORMAL LOW
Chloride: 111
Creatinine, Ser: 3.7 — ABNORMAL HIGH
GFR calc Af Amer: 20 — ABNORMAL LOW
GFR calc non Af Amer: 16 — ABNORMAL LOW
Glucose, Bld: 84

## 2010-11-29 LAB — COMPREHENSIVE METABOLIC PANEL
ALT: 25
Alkaline Phosphatase: 130 — ABNORMAL HIGH
BUN: 35 — ABNORMAL HIGH
CO2: 23
Chloride: 104
Glucose, Bld: 211 — ABNORMAL HIGH
Potassium: 3.7
Sodium: 135
Total Bilirubin: 1.3 — ABNORMAL HIGH
Total Protein: 7.6

## 2010-11-29 LAB — CROSSMATCH
ABO/RH(D): O POS
Antibody Screen: NEGATIVE

## 2010-11-29 LAB — CULTURE, BLOOD (ROUTINE X 2): Culture: NO GROWTH

## 2010-11-29 LAB — WOUND CULTURE: Culture: NORMAL

## 2010-11-29 LAB — CULTURE, ROUTINE-ABSCESS

## 2010-11-29 LAB — HEMOGLOBIN AND HEMATOCRIT, BLOOD: HCT: 25.9 — ABNORMAL LOW

## 2010-11-29 LAB — HEMOGLOBIN A1C: Hgb A1c MFr Bld: 5.7

## 2010-11-29 LAB — URINE MICROSCOPIC-ADD ON

## 2010-11-29 LAB — CALCIUM, IONIZED: Calcium, Ion: 0.98 — ABNORMAL LOW

## 2010-11-30 LAB — GLUCOSE, CAPILLARY
Glucose-Capillary: 103 — ABNORMAL HIGH
Glucose-Capillary: 108 — ABNORMAL HIGH
Glucose-Capillary: 110 — ABNORMAL HIGH
Glucose-Capillary: 119 — ABNORMAL HIGH
Glucose-Capillary: 121 — ABNORMAL HIGH
Glucose-Capillary: 162 — ABNORMAL HIGH
Glucose-Capillary: 204 — ABNORMAL HIGH
Glucose-Capillary: 209 — ABNORMAL HIGH
Glucose-Capillary: 210 — ABNORMAL HIGH
Glucose-Capillary: 219 — ABNORMAL HIGH
Glucose-Capillary: 220 — ABNORMAL HIGH
Glucose-Capillary: 223 — ABNORMAL HIGH
Glucose-Capillary: 249 — ABNORMAL HIGH
Glucose-Capillary: 298 — ABNORMAL HIGH
Glucose-Capillary: 358 — ABNORMAL HIGH
Glucose-Capillary: 422 — ABNORMAL HIGH
Glucose-Capillary: 74
Glucose-Capillary: 77
Glucose-Capillary: 78
Glucose-Capillary: 79
Glucose-Capillary: 86
Glucose-Capillary: 95

## 2010-11-30 LAB — URINE MICROSCOPIC-ADD ON

## 2010-11-30 LAB — URINALYSIS, ROUTINE W REFLEX MICROSCOPIC
Glucose, UA: NEGATIVE
Hgb urine dipstick: NEGATIVE
Ketones, ur: NEGATIVE
Nitrite: NEGATIVE
Protein, ur: 30 — AB
Specific Gravity, Urine: 1.027
Urobilinogen, UA: 1
pH: 5.5
pH: 6

## 2010-11-30 LAB — BASIC METABOLIC PANEL
BUN: 10
BUN: 12
BUN: 12
BUN: 16
CO2: 29
CO2: 29
Calcium: 8.3 — ABNORMAL LOW
Calcium: 8.5
Calcium: 8.5
Calcium: 8.5
Chloride: 101
Chloride: 105
Chloride: 99
Creatinine, Ser: 0.84
Creatinine, Ser: 0.88
Creatinine, Ser: 0.98
Creatinine, Ser: 1.03
GFR calc Af Amer: >60
GFR calc Af Amer: >60
GFR calc Af Amer: >60
GFR calc non Af Amer: >60
GFR calc non Af Amer: >60
GFR calc non Af Amer: >60
GFR calc non Af Amer: >60
GFR calc non Af Amer: >60
Glucose, Bld: 230 — ABNORMAL HIGH
Glucose, Bld: 72
Glucose, Bld: 83
Potassium: 3.7
Potassium: 4.1
Potassium: 4.2
Potassium: 4.7
Sodium: 138
Sodium: 139

## 2010-11-30 LAB — DIFFERENTIAL
Basophils Relative: 0
Eosinophils Absolute: 0.2
Eosinophils Relative: 4
Lymphs Abs: 1.4
Monocytes Absolute: 0.6
Monocytes Relative: 10
Neutrophils Relative %: 61

## 2010-11-30 LAB — CBC
HCT: 28.1 — ABNORMAL LOW
HCT: 30.1 — ABNORMAL LOW
HCT: 30.4 — ABNORMAL LOW
HCT: 30.6 — ABNORMAL LOW
HCT: 30.7 — ABNORMAL LOW
Hemoglobin: 10 — ABNORMAL LOW
MCHC: 33.8
MCHC: 34
MCHC: 34.3
MCV: 91.2
MCV: 91.6
MCV: 91.9
MCV: 92.2
MCV: 93.2
Platelets: 202
Platelets: 268
Platelets: 284
Platelets: 296
Platelets: 322
RBC: 3.01 — ABNORMAL LOW
RBC: 3.13 — ABNORMAL LOW
RBC: 3.14 — ABNORMAL LOW
RBC: 3.34 — ABNORMAL LOW
RBC: 3.53 — ABNORMAL LOW
RDW: 17.1 — ABNORMAL HIGH
RDW: 17.2 — ABNORMAL HIGH
RDW: 17.5 — ABNORMAL HIGH
RDW: 17.8 — ABNORMAL HIGH
WBC: 5
WBC: 5.7
WBC: 6.2
WBC: 7.5
WBC: 8.2
WBC: 8.9
WBC: 9.5

## 2010-11-30 LAB — COMPREHENSIVE METABOLIC PANEL
ALT: 13
AST: 14
Albumin: 2.1 — ABNORMAL LOW
CO2: 27
Calcium: 8.1 — ABNORMAL LOW
Chloride: 104
Creatinine, Ser: 1.02
GFR calc Af Amer: >60
Sodium: 135
Total Bilirubin: 0.6

## 2010-11-30 LAB — POCT I-STAT, CHEM 8
BUN: 10
Calcium, Ion: 1.07 — ABNORMAL LOW
Chloride: 100
HCT: 30 — ABNORMAL LOW
Potassium: 3.2 — ABNORMAL LOW
Sodium: 136

## 2010-11-30 LAB — URINE CULTURE
Colony Count: 25000
Colony Count: >=100000
Special Requests: NEGATIVE

## 2010-11-30 LAB — HEMOGLOBIN A1C: Hgb A1c MFr Bld: 5.8

## 2010-11-30 LAB — CULTURE, BLOOD (ROUTINE X 2): Culture: NO GROWTH

## 2010-11-30 LAB — WOUND CULTURE

## 2010-11-30 LAB — VANCOMYCIN, TROUGH: Vancomycin Tr: 22 — ABNORMAL HIGH

## 2010-11-30 LAB — CLOSTRIDIUM DIFFICILE EIA

## 2010-11-30 LAB — GLUCOSE, RANDOM: Glucose, Bld: 80

## 2010-11-30 LAB — APTT: aPTT: 37

## 2010-11-30 LAB — PROTIME-INR
INR: 1
Prothrombin Time: 13.4

## 2010-12-03 LAB — CBC
HCT: 26.5 — ABNORMAL LOW
HCT: 26.8 — ABNORMAL LOW
HCT: 29 — ABNORMAL LOW
HCT: 29.3 — ABNORMAL LOW
Hemoglobin: 10 — ABNORMAL LOW
Hemoglobin: 9 — ABNORMAL LOW
Hemoglobin: 9 — ABNORMAL LOW
Hemoglobin: 9.3 — ABNORMAL LOW
MCHC: 33.1
MCHC: 33.8
MCHC: 33.8
MCHC: 34.1
MCV: 91.5
MCV: 91.8
MCV: 91.9
MCV: 93
Platelets: 253
Platelets: 293
Platelets: 395
RBC: 2.9 — ABNORMAL LOW
RBC: 3.24 — ABNORMAL LOW
RDW: 16.1 — ABNORMAL HIGH
RDW: 16.4 — ABNORMAL HIGH
RDW: 16.8 — ABNORMAL HIGH
RDW: 16.9 — ABNORMAL HIGH
WBC: 17.8 — ABNORMAL HIGH
WBC: 7.8

## 2010-12-03 LAB — COMPREHENSIVE METABOLIC PANEL
ALT: <8
AST: 12
Albumin: 1.7 — ABNORMAL LOW
Alkaline Phosphatase: 73
BUN: 59 — ABNORMAL HIGH
CO2: 18 — ABNORMAL LOW
Calcium: 7 — ABNORMAL LOW
Calcium: 8.1 — ABNORMAL LOW
Creatinine, Ser: 1.3
GFR calc Af Amer: >60
GFR calc non Af Amer: 11 — ABNORMAL LOW
Glucose, Bld: 182 — ABNORMAL HIGH
Potassium: 2.8 — ABNORMAL LOW
Sodium: 135
Total Protein: 4.9 — ABNORMAL LOW

## 2010-12-03 LAB — CARBOXYHEMOGLOBIN
Carboxyhemoglobin: 1.3
O2 Saturation: 79.4
Total hemoglobin: 10 — ABNORMAL LOW
Total hemoglobin: 9.5 — ABNORMAL LOW

## 2010-12-03 LAB — PROTIME-INR
INR: 1.5
Prothrombin Time: 18.5 — ABNORMAL HIGH

## 2010-12-03 LAB — GLUCOSE, CAPILLARY
Glucose-Capillary: 100 — ABNORMAL HIGH
Glucose-Capillary: 101 — ABNORMAL HIGH
Glucose-Capillary: 102 — ABNORMAL HIGH
Glucose-Capillary: 105 — ABNORMAL HIGH
Glucose-Capillary: 107 — ABNORMAL HIGH
Glucose-Capillary: 115 — ABNORMAL HIGH
Glucose-Capillary: 117 — ABNORMAL HIGH
Glucose-Capillary: 117 — ABNORMAL HIGH
Glucose-Capillary: 123 — ABNORMAL HIGH
Glucose-Capillary: 134 — ABNORMAL HIGH
Glucose-Capillary: 135 — ABNORMAL HIGH
Glucose-Capillary: 138 — ABNORMAL HIGH
Glucose-Capillary: 159 — ABNORMAL HIGH
Glucose-Capillary: 165 — ABNORMAL HIGH
Glucose-Capillary: 175 — ABNORMAL HIGH
Glucose-Capillary: 223 — ABNORMAL HIGH
Glucose-Capillary: 243 — ABNORMAL HIGH
Glucose-Capillary: 248 — ABNORMAL HIGH
Glucose-Capillary: 284 — ABNORMAL HIGH
Glucose-Capillary: 84
Glucose-Capillary: 88
Glucose-Capillary: 92

## 2010-12-03 LAB — BASIC METABOLIC PANEL
BUN: 24 — ABNORMAL HIGH
BUN: 30 — ABNORMAL HIGH
BUN: 46 — ABNORMAL HIGH
CO2: 17 — ABNORMAL LOW
Chloride: 112
Chloride: 115 — ABNORMAL HIGH
Creatinine, Ser: 1.55 — ABNORMAL HIGH
Creatinine, Ser: 2.93 — ABNORMAL HIGH
GFR calc non Af Amer: 37 — ABNORMAL LOW
GFR calc non Af Amer: 45 — ABNORMAL LOW
GFR calc non Af Amer: >60
Glucose, Bld: 101 — ABNORMAL HIGH
Glucose, Bld: 109 — ABNORMAL HIGH
Glucose, Bld: 135 — ABNORMAL HIGH
Glucose, Bld: 163 — ABNORMAL HIGH
Potassium: 2.6 — CL
Potassium: 2.9 — ABNORMAL LOW
Potassium: 3.3 — ABNORMAL LOW
Potassium: 3.6
Sodium: 136
Sodium: 138

## 2010-12-03 LAB — URINALYSIS, ROUTINE W REFLEX MICROSCOPIC
Glucose, UA: NEGATIVE
Nitrite: NEGATIVE
Specific Gravity, Urine: 1.022
pH: 6

## 2010-12-03 LAB — BLOOD GAS, ARTERIAL
Acid-base deficit: 7.2 — ABNORMAL HIGH
Bicarbonate: 17.7 — ABNORMAL LOW
Drawn by: 246861
O2 Content: 2
O2 Saturation: 96.4
TCO2: 16.2
pCO2 arterial: 35.6
pO2, Arterial: 96.6

## 2010-12-03 LAB — CARDIAC PANEL(CRET KIN+CKTOT+MB+TROPI): CK, MB: 1.2

## 2010-12-03 LAB — URINE CULTURE: Colony Count: >=100000

## 2010-12-03 LAB — D-DIMER, QUANTITATIVE: D-Dimer, Quant: 3.37 — ABNORMAL HIGH

## 2010-12-03 LAB — CLOSTRIDIUM DIFFICILE EIA: C difficile Toxins A+B, EIA: NEGATIVE

## 2010-12-03 LAB — URINE MICROSCOPIC-ADD ON

## 2010-12-03 LAB — OCCULT BLOOD X 1 CARD TO LAB, STOOL: Fecal Occult Bld: POSITIVE

## 2010-12-03 LAB — CULTURE, BLOOD (ROUTINE X 2): Culture: NO GROWTH

## 2010-12-03 LAB — DIFFERENTIAL
Basophils Relative: 0
Eosinophils Absolute: 0
Neutro Abs: 14.2 — ABNORMAL HIGH
Neutrophils Relative %: 87 — ABNORMAL HIGH

## 2010-12-03 LAB — ABO/RH: ABO/RH(D): O POS

## 2010-12-03 LAB — APTT: aPTT: 33

## 2010-12-03 LAB — PHOSPHORUS: Phosphorus: 1.8 — ABNORMAL LOW

## 2010-12-03 LAB — MAGNESIUM: Magnesium: 1.5

## 2010-12-03 LAB — CORTISOL: Cortisol, Plasma: 18.4

## 2010-12-04 ENCOUNTER — Other Ambulatory Visit: Payer: Self-pay | Admitting: Family Medicine

## 2010-12-05 LAB — URINALYSIS, ROUTINE W REFLEX MICROSCOPIC
Glucose, UA: NEGATIVE
Nitrite: NEGATIVE
Specific Gravity, Urine: 1.017
pH: 5.5

## 2010-12-05 LAB — URINE CULTURE

## 2010-12-05 LAB — CULTURE, BLOOD (ROUTINE X 2)
Culture: NO GROWTH
Culture: NO GROWTH

## 2010-12-05 LAB — DIFFERENTIAL
Basophils Absolute: 0
Eosinophils Absolute: 0.1
Eosinophils Relative: 2
Lymphs Abs: 0.9
Neutro Abs: 6.3

## 2010-12-05 LAB — BASIC METABOLIC PANEL
BUN: 15
Chloride: 100
GFR calc Af Amer: >60
GFR calc non Af Amer: >60
Potassium: 3 — ABNORMAL LOW
Sodium: 133 — ABNORMAL LOW

## 2010-12-05 LAB — CBC
HCT: 31.7 — ABNORMAL LOW
MCV: 92.4
Platelets: 182
RBC: 3.43 — ABNORMAL LOW
WBC: 7.8

## 2010-12-05 LAB — URINE MICROSCOPIC-ADD ON

## 2010-12-05 LAB — CLOSTRIDIUM DIFFICILE EIA

## 2010-12-05 LAB — STOOL CULTURE

## 2010-12-06 ENCOUNTER — Other Ambulatory Visit: Payer: Self-pay | Admitting: Family Medicine

## 2010-12-06 NOTE — Telephone Encounter (Signed)
Last seen 08/31/10 and filled 10/30/10 please advise     KP

## 2010-12-07 NOTE — Telephone Encounter (Signed)
Faxed.   KP 

## 2010-12-12 ENCOUNTER — Other Ambulatory Visit: Payer: Self-pay | Admitting: Family Medicine

## 2010-12-18 ENCOUNTER — Other Ambulatory Visit: Payer: Self-pay | Admitting: Family Medicine

## 2010-12-18 DIAGNOSIS — E785 Hyperlipidemia, unspecified: Secondary | ICD-10-CM

## 2010-12-18 DIAGNOSIS — E039 Hypothyroidism, unspecified: Secondary | ICD-10-CM

## 2010-12-18 DIAGNOSIS — E119 Type 2 diabetes mellitus without complications: Secondary | ICD-10-CM

## 2010-12-18 DIAGNOSIS — L0390 Cellulitis, unspecified: Secondary | ICD-10-CM

## 2010-12-19 ENCOUNTER — Telehealth: Payer: Self-pay | Admitting: *Deleted

## 2010-12-19 ENCOUNTER — Ambulatory Visit (INDEPENDENT_AMBULATORY_CARE_PROVIDER_SITE_OTHER): Payer: Medicare Other | Admitting: *Deleted

## 2010-12-19 ENCOUNTER — Other Ambulatory Visit: Payer: Self-pay

## 2010-12-19 ENCOUNTER — Other Ambulatory Visit: Payer: Self-pay | Admitting: Family Medicine

## 2010-12-19 ENCOUNTER — Other Ambulatory Visit (INDEPENDENT_AMBULATORY_CARE_PROVIDER_SITE_OTHER): Payer: Medicare Other

## 2010-12-19 DIAGNOSIS — S88919A Complete traumatic amputation of unspecified lower leg, level unspecified, initial encounter: Secondary | ICD-10-CM

## 2010-12-19 DIAGNOSIS — N390 Urinary tract infection, site not specified: Secondary | ICD-10-CM

## 2010-12-19 DIAGNOSIS — Z23 Encounter for immunization: Secondary | ICD-10-CM

## 2010-12-19 DIAGNOSIS — E785 Hyperlipidemia, unspecified: Secondary | ICD-10-CM

## 2010-12-19 DIAGNOSIS — L0390 Cellulitis, unspecified: Secondary | ICD-10-CM

## 2010-12-19 DIAGNOSIS — I10 Essential (primary) hypertension: Secondary | ICD-10-CM

## 2010-12-19 DIAGNOSIS — L0291 Cutaneous abscess, unspecified: Secondary | ICD-10-CM

## 2010-12-19 DIAGNOSIS — E119 Type 2 diabetes mellitus without complications: Secondary | ICD-10-CM

## 2010-12-19 DIAGNOSIS — E039 Hypothyroidism, unspecified: Secondary | ICD-10-CM

## 2010-12-19 DIAGNOSIS — I4891 Unspecified atrial fibrillation: Secondary | ICD-10-CM

## 2010-12-19 DIAGNOSIS — Z7901 Long term (current) use of anticoagulants: Secondary | ICD-10-CM

## 2010-12-19 LAB — CBC WITH DIFFERENTIAL/PLATELET
Basophils Relative: 0.4 % (ref 0.0–3.0)
Eosinophils Absolute: 0.1 10*3/uL (ref 0.0–0.7)
HCT: 36.2 % — ABNORMAL LOW (ref 39.0–52.0)
Hemoglobin: 12 g/dL — ABNORMAL LOW (ref 13.0–17.0)
Lymphocytes Relative: 17.2 % (ref 12.0–46.0)
Lymphs Abs: 1 10*3/uL (ref 0.7–4.0)
MCHC: 33.2 g/dL (ref 30.0–36.0)
MCV: 92.9 fl (ref 78.0–100.0)
Neutro Abs: 3.9 10*3/uL (ref 1.4–7.7)
RBC: 3.9 Mil/uL — ABNORMAL LOW (ref 4.22–5.81)

## 2010-12-19 LAB — BASIC METABOLIC PANEL
BUN: 28 mg/dL — ABNORMAL HIGH (ref 6–23)
CO2: 29 mEq/L (ref 19–32)
Calcium: 9.4 mg/dL (ref 8.4–10.5)
Creatinine, Ser: 1.6 mg/dL — ABNORMAL HIGH (ref 0.4–1.5)
GFR: 45.75 mL/min — ABNORMAL LOW (ref >60.00)
Glucose, Bld: 100 mg/dL — ABNORMAL HIGH (ref 70–99)

## 2010-12-19 LAB — HEPATIC FUNCTION PANEL
Albumin: 4.2 g/dL (ref 3.5–5.2)
Total Protein: 7.8 g/dL (ref 6.0–8.3)

## 2010-12-19 LAB — LIPID PANEL
Cholesterol: 132 mg/dL (ref 0–200)
HDL: 34.6 mg/dL — ABNORMAL LOW (ref >39.00)
Triglycerides: 181 mg/dL — ABNORMAL HIGH (ref 0.0–149.0)
VLDL: 36.2 mg/dL (ref 0.0–40.0)

## 2010-12-19 LAB — POCT URINALYSIS DIPSTICK
Bilirubin, UA: NEGATIVE
Blood, UA: NEGATIVE
Glucose, UA: 100
Ketones, UA: NEGATIVE
Nitrite, UA: POSITIVE

## 2010-12-19 LAB — TSH: TSH: 0.95 u[IU]/mL (ref 0.35–5.50)

## 2010-12-19 LAB — MICROALBUMIN / CREATININE URINE RATIO: Microalb Creat Ratio: 1.9 mg/g (ref 0.0–30.0)

## 2010-12-19 MED ORDER — INSULIN PEN NEEDLE 32G X 5 MM MISC: 1.2000 mg | Freq: Every day | Status: DC

## 2010-12-19 MED ORDER — CIPROFLOXACIN HCL 500 MG PO TABS: 500.0000 mg | ORAL_TABLET | Freq: Two times a day (BID) | ORAL | Status: AC

## 2010-12-19 NOTE — Progress Notes (Signed)
Addended by: Legrand Como on: 12/19/2010 01:00 PM   Modules accepted: Orders

## 2010-12-19 NOTE — Telephone Encounter (Signed)
Message copied by Verdene Rio on Wed Dec 19, 2010  5:13 PM ------      Message from: Lelon Perla      Created: Wed Dec 19, 2010 12:30 PM       + UTI ---cipro 500 mg 1 po bid x5 days

## 2010-12-19 NOTE — Telephone Encounter (Signed)
Discuss with patient wife, Rx sent to pharmacy

## 2010-12-19 NOTE — Patient Instructions (Signed)
Return to office in 2 weeks for PT/INR  New dosing instruction: Take extra 1/2 tab today then resume 1 tab daily except 1 1/2 tab on M,F

## 2010-12-20 ENCOUNTER — Other Ambulatory Visit: Payer: Self-pay | Admitting: Family Medicine

## 2010-12-21 ENCOUNTER — Other Ambulatory Visit: Payer: Self-pay | Admitting: Family Medicine

## 2010-12-21 ENCOUNTER — Telehealth: Payer: Self-pay

## 2010-12-21 NOTE — Telephone Encounter (Signed)
His morning sugar was excellent and his A1C from last week was 7.2  He has been on the Victoza since 10/4 and if he has not had any low readings, I'm not sure we should adjust the meds.  As long as he continues to eat regularly this should not be an issue.  Dr Laury Axon can adjust on Monday if she feels differently.

## 2010-12-21 NOTE — Telephone Encounter (Signed)
Call from Nurse TJ at [facility] and she is out doing home health at the patient's home and she noticed that the patient is taking too much Diabetes medication and afraid that the patient will bottom out. He is currently taking Humalog 25 units TID, Lantus 35 units BID and Victoza 1.2 daily. She is concerned about him taking all of these medications and thinks he will bottom out, his glucose today was 103 mg /dl Please advise in Dr.Lowne;s absence. He does have current labs from this week and his A1C was 7.2. Please advise     KP

## 2010-12-21 NOTE — Telephone Encounter (Signed)
Spoke with BJ at [phone] and she stated she discussed with Wife and she stopped his Humalog 2 months ago. Patient is only taking the Lantus and the Victoza. I advised of the recommendations from Dr.Tabori and advised if there was anything different  Dr.Lowne would like to do then we would call and discuss. She stated we could call the patient care coordinator at [phone] .    KP

## 2010-12-22 LAB — URINE CULTURE

## 2010-12-23 NOTE — Telephone Encounter (Signed)
agree

## 2010-12-24 NOTE — Telephone Encounter (Signed)
Discussed with Britta Mccreedy and she has been made aware to continue Victoza and Lantus and continue Humalog on a sliding scale and if blood sugars are still high Dr.Lowne wants her to call if they are consistently high. Patient voiced understanding and agreed not to take the humalog daily only when needed for sliding scale and to call with any problems or concerns.    KP

## 2010-12-31 ENCOUNTER — Other Ambulatory Visit: Payer: Self-pay | Admitting: Family Medicine

## 2011-01-02 ENCOUNTER — Telehealth: Payer: Self-pay

## 2011-01-02 NOTE — Telephone Encounter (Signed)
Discussed with Britta Mccreedy and she voiced understanding. She stated that patient had not taken his meds when his BP as checked and will follow if needed. She is scheduled for friday for a PT check    KP

## 2011-01-02 NOTE — Telephone Encounter (Signed)
Call from [place] at [facility] and she stated that the patient PT is 27.1 and his INR is 2.3. He is taking 5 mg daily. His BP was 180/126 and when rechecked in the opposite arm it was 140/100. Please advise      KP

## 2011-01-02 NOTE — Telephone Encounter (Signed)
Coumadin--con't same dose Bp-- was pt in any distress,  Had he just been up or exerted self in anyway? ----if no--- pt needs ov to evaluate bp

## 2011-01-11 ENCOUNTER — Other Ambulatory Visit: Payer: Self-pay | Admitting: Family Medicine

## 2011-01-11 MED ORDER — AMBULATORY NON FORMULARY MEDICATION: 1.0000 | Freq: Every day | Status: DC | PRN

## 2011-01-11 NOTE — Telephone Encounter (Signed)
Ok to give rx  

## 2011-01-11 NOTE — Telephone Encounter (Signed)
Wife requested Rx be mailed   KP

## 2011-01-11 NOTE — Telephone Encounter (Signed)
Pt wife indicated that Rx just need to say prosthetic supply. Pt wife notes that he usually just get socks and sleeves for prosthetic leg.

## 2011-01-12 ENCOUNTER — Other Ambulatory Visit: Payer: Self-pay | Admitting: Family Medicine

## 2011-01-14 ENCOUNTER — Other Ambulatory Visit: Payer: Self-pay | Admitting: Family Medicine

## 2011-01-15 NOTE — Telephone Encounter (Signed)
Last OV 08-31-10 last refill 12-06-10

## 2011-01-15 NOTE — Telephone Encounter (Signed)
Noted MD sent via escript

## 2011-01-25 ENCOUNTER — Telehealth: Payer: Self-pay | Admitting: *Deleted

## 2011-01-25 NOTE — Telephone Encounter (Signed)
OK ; same codes

## 2011-01-25 NOTE — Telephone Encounter (Signed)
Spoke with Victorino Dike, verbal order given

## 2011-01-25 NOTE — Telephone Encounter (Signed)
[name]:Nurse from [facility] called to advise that pt orders ended today 01-25-11 per wanted to see if new orders could be sent for home care visits for in boots to come into home 1xweek for 3 more weeks per still has sore on his Left ankle

## 2011-02-01 ENCOUNTER — Other Ambulatory Visit: Payer: Self-pay | Admitting: Family Medicine

## 2011-02-02 ENCOUNTER — Other Ambulatory Visit: Payer: Self-pay | Admitting: Family Medicine

## 2011-02-08 ENCOUNTER — Other Ambulatory Visit: Payer: Self-pay | Admitting: Family Medicine

## 2011-02-10 ENCOUNTER — Other Ambulatory Visit: Payer: Self-pay | Admitting: Family Medicine

## 2011-02-14 ENCOUNTER — Telehealth: Payer: Self-pay

## 2011-02-14 NOTE — Telephone Encounter (Signed)
Detailed message leaving Verbal authorization to continue home health.      KP

## 2011-02-14 NOTE — Telephone Encounter (Signed)
Ok to give verbal 

## 2011-02-14 NOTE — Telephone Encounter (Signed)
msg from [employer] requesting Verbal authorization to continue Home health care. Please advise    KP

## 2011-02-15 ENCOUNTER — Other Ambulatory Visit: Payer: Self-pay | Admitting: Family Medicine

## 2011-02-19 ENCOUNTER — Telehealth: Payer: Self-pay

## 2011-02-19 NOTE — Telephone Encounter (Signed)
Msg from [place] at [facility] and she stated she had been doing would care on this patient and his wound is completely healed and she wanted to know if she can get an order to have the patient continue to use compression stockings. Please advise     KP

## 2011-02-19 NOTE — Telephone Encounter (Signed)
Faxed.   KP 

## 2011-02-19 NOTE — Telephone Encounter (Signed)
Last seen 08/31/10 and filled 01/14/11 #90 please advise    KP

## 2011-02-19 NOTE — Telephone Encounter (Signed)
Victorino Dike made aware    KP

## 2011-02-19 NOTE — Telephone Encounter (Signed)
Ok to give order to use compression stockings

## 2011-02-25 ENCOUNTER — Other Ambulatory Visit: Payer: Self-pay | Admitting: Family Medicine

## 2011-03-01 ENCOUNTER — Other Ambulatory Visit: Payer: Self-pay | Admitting: Family Medicine

## 2011-03-02 ENCOUNTER — Other Ambulatory Visit: Payer: Self-pay | Admitting: Family Medicine

## 2011-03-11 ENCOUNTER — Telehealth: Payer: Self-pay

## 2011-03-11 NOTE — Telephone Encounter (Signed)
Ok to give verbal 

## 2011-03-11 NOTE — Telephone Encounter (Signed)
Crystal the OT from Home Health would like a verbal for a referral for a Child psychotherapist.  Pls advise.

## 2011-03-11 NOTE — Telephone Encounter (Signed)
Verbal given and she also stated she noticed some open sores oozing from the patient legs and wanted to know if it was ok to have a nurse look at them. I advised that was ok and made Dr.Lowne aware      KP

## 2011-03-13 ENCOUNTER — Telehealth: Payer: Self-pay | Admitting: *Deleted

## 2011-03-13 ENCOUNTER — Other Ambulatory Visit: Payer: Self-pay | Admitting: Family Medicine

## 2011-03-13 MED ORDER — WARFARIN SODIUM 5 MG PO TABS: 5.0000 mg | ORAL_TABLET | Freq: Every day | ORAL | Status: DC

## 2011-03-13 NOTE — Telephone Encounter (Signed)
Yes -please --- let us know i he needs to go to wound care center

## 2011-03-13 NOTE — Telephone Encounter (Signed)
Spoke w/home health RN and gave verbal OK for wound care per protocol. RN reports that pt has a small open area on lateral calf that is draining clear fluid off and on with some redness and pain but no fever. She will call back w/updates as needed.

## 2011-03-17 ENCOUNTER — Other Ambulatory Visit: Payer: Self-pay | Admitting: Family Medicine

## 2011-03-19 ENCOUNTER — Other Ambulatory Visit: Payer: Self-pay | Admitting: Family Medicine

## 2011-03-19 NOTE — Telephone Encounter (Signed)
Last OV 08-31-10 last refill 02-15-12 #90 no refills

## 2011-03-20 ENCOUNTER — Other Ambulatory Visit: Payer: Self-pay | Admitting: Family Medicine

## 2011-03-20 MED ORDER — HYDROCODONE-ACETAMINOPHEN 7.5-750 MG PO TABS: 1.0000 | ORAL_TABLET | Freq: Four times a day (QID) | ORAL | Status: DC | PRN

## 2011-03-20 NOTE — Telephone Encounter (Signed)
[facility] called wanting to get verbal ok to continue with home health aide. Verbal ok given

## 2011-03-20 NOTE — Telephone Encounter (Signed)
Addended by: Derry Lory A on: 03/20/2011 04:33 PM   Modules accepted: Orders

## 2011-03-20 NOTE — Telephone Encounter (Signed)
Noted MD Lowne approved for medication, .rx faxed to pharmacy, manually.

## 2011-03-24 ENCOUNTER — Other Ambulatory Visit: Payer: Self-pay | Admitting: Family Medicine

## 2011-03-25 NOTE — Telephone Encounter (Signed)
rx sent to pharmacy by e-script  

## 2011-03-31 ENCOUNTER — Other Ambulatory Visit: Payer: Self-pay | Admitting: Family Medicine

## 2011-04-01 NOTE — Telephone Encounter (Signed)
Letter mailed to schedule an OV.     KP 

## 2011-04-02 ENCOUNTER — Other Ambulatory Visit: Payer: Self-pay | Admitting: Family Medicine

## 2011-04-05 ENCOUNTER — Telehealth: Payer: Self-pay

## 2011-04-05 NOTE — Telephone Encounter (Signed)
FYI-----Call from Nurse at [facility] and she stated that the patient INR was 2.4 and his PT was 28.5.    KP

## 2011-04-05 NOTE — Telephone Encounter (Signed)
[name] has been made aware and voiced understanding      KP

## 2011-04-05 NOTE — Telephone Encounter (Signed)
Con't current coumadin dose---recheck 1 month

## 2011-04-09 ENCOUNTER — Other Ambulatory Visit: Payer: Self-pay | Admitting: Family Medicine

## 2011-04-10 ENCOUNTER — Other Ambulatory Visit: Payer: Self-pay | Admitting: Family Medicine

## 2011-04-10 DIAGNOSIS — E119 Type 2 diabetes mellitus without complications: Secondary | ICD-10-CM

## 2011-04-10 DIAGNOSIS — E785 Hyperlipidemia, unspecified: Secondary | ICD-10-CM

## 2011-04-11 ENCOUNTER — Other Ambulatory Visit (INDEPENDENT_AMBULATORY_CARE_PROVIDER_SITE_OTHER): Payer: Medicare Other

## 2011-04-11 DIAGNOSIS — E119 Type 2 diabetes mellitus without complications: Secondary | ICD-10-CM

## 2011-04-11 DIAGNOSIS — E785 Hyperlipidemia, unspecified: Secondary | ICD-10-CM

## 2011-04-11 LAB — LIPID PANEL
Cholesterol: 160 mg/dL (ref 0–200)
Triglycerides: 185 mg/dL — ABNORMAL HIGH (ref 0.0–149.0)

## 2011-04-11 LAB — HEPATIC FUNCTION PANEL
AST: 19 U/L (ref 0–37)
Albumin: 4 g/dL (ref 3.5–5.2)
Alkaline Phosphatase: 43 U/L (ref 39–117)
Bilirubin, Direct: 0.1 mg/dL (ref 0.0–0.3)
Total Protein: 7.6 g/dL (ref 6.0–8.3)

## 2011-04-11 LAB — BASIC METABOLIC PANEL
BUN: 30 mg/dL — ABNORMAL HIGH (ref 6–23)
Calcium: 9.6 mg/dL (ref 8.4–10.5)
Creatinine, Ser: 1.7 mg/dL — ABNORMAL HIGH (ref 0.4–1.5)

## 2011-04-11 LAB — MICROALBUMIN / CREATININE URINE RATIO: Microalb, Ur: 2.9 mg/dL — ABNORMAL HIGH (ref 0.0–1.9)

## 2011-04-11 LAB — HEMOGLOBIN A1C: Hgb A1c MFr Bld: 9.4 % — ABNORMAL HIGH (ref 4.6–6.5)

## 2011-04-15 ENCOUNTER — Other Ambulatory Visit: Payer: Self-pay | Admitting: Family Medicine

## 2011-04-15 NOTE — Telephone Encounter (Signed)
Last seen 08/31/10 and faxed 03/20/11 # 90 please advise     KP

## 2011-04-16 MED ORDER — INSULIN GLARGINE 100 UNIT/ML ~~LOC~~ SOLN: 40.0000 [IU] | Freq: Two times a day (BID) | SUBCUTANEOUS | Status: DC

## 2011-04-16 MED ORDER — ROSUVASTATIN CALCIUM 20 MG PO TABS: 20.0000 mg | ORAL_TABLET | Freq: Every day | ORAL | Status: DC

## 2011-04-16 MED ORDER — LIRAGLUTIDE 18 MG/3ML ~~LOC~~ SOLN: 1.8000 mg | Freq: Every day | SUBCUTANEOUS | Status: DC

## 2011-05-02 ENCOUNTER — Ambulatory Visit: Payer: Medicare Other | Admitting: Endocrinology

## 2011-05-02 ENCOUNTER — Other Ambulatory Visit: Payer: Self-pay | Admitting: Family Medicine

## 2011-05-03 ENCOUNTER — Telehealth: Payer: Self-pay | Admitting: Family Medicine

## 2011-05-03 NOTE — Telephone Encounter (Signed)
Tried to call # back phone rang then began to beep will try again to contact [facility].

## 2011-05-03 NOTE — Telephone Encounter (Signed)
Ok to give verbal 

## 2011-05-03 NOTE — Telephone Encounter (Signed)
Victorino Dike from [facility] called requesting orders from Dr. Laury Axon to continue going to pt's home to change his dressing. She states she is currently going 1x week, but patient has requested 2x week. She would like to do 2x week for 4-6 weeks. Please call [phone]

## 2011-05-07 NOTE — Telephone Encounter (Signed)
Left message to call office

## 2011-05-08 NOTE — Telephone Encounter (Signed)
Spoke to [place] who advised that she has already spoke to [place] about these orders.

## 2011-05-26 ENCOUNTER — Other Ambulatory Visit: Payer: Self-pay | Admitting: Family Medicine

## 2011-05-29 ENCOUNTER — Ambulatory Visit: Payer: Medicare Other | Admitting: Endocrinology

## 2011-06-03 ENCOUNTER — Other Ambulatory Visit: Payer: Self-pay | Admitting: Family Medicine

## 2011-06-03 NOTE — Telephone Encounter (Signed)
Last seen 08/31/10 and filled 04/15/11  #90. Please advise     KP

## 2011-06-06 ENCOUNTER — Other Ambulatory Visit: Payer: Self-pay | Admitting: Family Medicine

## 2011-06-08 ENCOUNTER — Other Ambulatory Visit: Payer: Self-pay | Admitting: Family Medicine

## 2011-06-09 ENCOUNTER — Other Ambulatory Visit: Payer: Self-pay | Admitting: Family Medicine

## 2011-06-28 ENCOUNTER — Ambulatory Visit: Payer: Medicare Other | Admitting: Endocrinology

## 2011-06-30 ENCOUNTER — Other Ambulatory Visit: Payer: Self-pay | Admitting: Family Medicine

## 2011-07-01 ENCOUNTER — Other Ambulatory Visit: Payer: Self-pay | Admitting: Family Medicine

## 2011-07-11 ENCOUNTER — Other Ambulatory Visit: Payer: Self-pay | Admitting: Family Medicine

## 2011-07-11 NOTE — Telephone Encounter (Signed)
Last seen 08/31/10 and filled 06/04/11 # 90 Please advise     KP  Letter mailed to schedule and apt

## 2011-07-12 ENCOUNTER — Other Ambulatory Visit: Payer: Self-pay | Admitting: Family Medicine

## 2011-07-14 ENCOUNTER — Other Ambulatory Visit: Payer: Self-pay | Admitting: Family Medicine

## 2011-08-05 ENCOUNTER — Telehealth: Payer: Self-pay | Admitting: Family Medicine

## 2011-08-05 NOTE — Telephone Encounter (Signed)
Appt schedule for tomorrow 

## 2011-08-05 NOTE — Telephone Encounter (Signed)
Right Leg wound has broken open Needs Home Health Nurse again, per call from Shelda Pal (Daughter-in-law). Can call Eber Jones at [phone]

## 2011-08-05 NOTE — Telephone Encounter (Signed)
yes

## 2011-08-05 NOTE — Telephone Encounter (Signed)
patient has been released from home health for wound care, would you like to see him in the office. Please advise     KP

## 2011-08-05 NOTE — Telephone Encounter (Signed)
msg left to call the office     KP 

## 2011-08-06 ENCOUNTER — Encounter: Payer: Self-pay | Admitting: Family Medicine

## 2011-08-06 ENCOUNTER — Ambulatory Visit (INDEPENDENT_AMBULATORY_CARE_PROVIDER_SITE_OTHER): Payer: Medicare Other | Admitting: Family Medicine

## 2011-08-06 VITALS — BP 98/60 | HR 80 | Temp 97.9°F

## 2011-08-06 DIAGNOSIS — L0390 Cellulitis, unspecified: Secondary | ICD-10-CM

## 2011-08-06 DIAGNOSIS — L0291 Cutaneous abscess, unspecified: Secondary | ICD-10-CM

## 2011-08-06 DIAGNOSIS — L03116 Cellulitis of left lower limb: Secondary | ICD-10-CM

## 2011-08-06 DIAGNOSIS — L02419 Cutaneous abscess of limb, unspecified: Secondary | ICD-10-CM

## 2011-08-06 MED ORDER — CEPHALEXIN 500 MG PO CAPS: 500.0000 mg | ORAL_CAPSULE | Freq: Two times a day (BID) | ORAL | Status: DC

## 2011-08-06 NOTE — Progress Notes (Signed)
  Subjective:    Patient ID: [name], male    DOB: 1940-12-23, 71 y.o.   MRN: [id number]  HPI  Pt here c/o cellulitis and wound on L leg.  Wound on ankle healed and then this one popped up.  His home health had finished. Pt asking for home health.  Review of Systems As above    Objective:   Physical Exam  Constitutional: He is oriented to person, place, and time. He appears well-developed and well-nourished.  Neurological: He is alert and oriented to person, place, and time.  Skin:       L low leg--+ errythema and blisters--oozing serous fluid Warm to touch  Psychiatric: He has a normal mood and affect. His behavior is normal. Judgment and thought content normal.          Assessment & Plan:

## 2011-08-06 NOTE — Patient Instructions (Signed)

## 2011-08-06 NOTE — Assessment & Plan Note (Signed)
Keflex 500 mg 1 po bid  Home health for wound care

## 2011-08-07 ENCOUNTER — Other Ambulatory Visit: Payer: Self-pay | Admitting: Family Medicine

## 2011-08-08 ENCOUNTER — Other Ambulatory Visit: Payer: Self-pay | Admitting: Family Medicine

## 2011-08-09 ENCOUNTER — Other Ambulatory Visit: Payer: Self-pay | Admitting: Family Medicine

## 2011-08-09 LAB — WOUND CULTURE
Gram Stain: NONE SEEN
Organism ID, Bacteria: NO GROWTH

## 2011-08-10 ENCOUNTER — Other Ambulatory Visit: Payer: Self-pay | Admitting: Family Medicine

## 2011-08-14 ENCOUNTER — Other Ambulatory Visit: Payer: Self-pay | Admitting: Family Medicine

## 2011-08-16 ENCOUNTER — Other Ambulatory Visit: Payer: Self-pay | Admitting: Family Medicine

## 2011-08-30 ENCOUNTER — Other Ambulatory Visit: Payer: Self-pay | Admitting: Family Medicine

## 2011-08-30 NOTE — Telephone Encounter (Signed)
Last seen 08/06/11 and filled 07/11/11 # 90. Please advise    KP

## 2011-09-02 DIAGNOSIS — I10 Essential (primary) hypertension: Secondary | ICD-10-CM

## 2011-09-02 DIAGNOSIS — L03119 Cellulitis of unspecified part of limb: Secondary | ICD-10-CM

## 2011-09-02 DIAGNOSIS — L02419 Cutaneous abscess of limb, unspecified: Secondary | ICD-10-CM

## 2011-09-02 DIAGNOSIS — E1169 Type 2 diabetes mellitus with other specified complication: Secondary | ICD-10-CM

## 2011-09-02 DIAGNOSIS — M9080 Osteopathy in diseases classified elsewhere, unspecified site: Secondary | ICD-10-CM

## 2011-09-06 ENCOUNTER — Other Ambulatory Visit: Payer: Self-pay | Admitting: Family Medicine

## 2011-09-06 NOTE — Telephone Encounter (Signed)
Refill done.  

## 2011-09-09 ENCOUNTER — Other Ambulatory Visit: Payer: Self-pay | Admitting: Family Medicine

## 2011-09-23 ENCOUNTER — Telehealth: Payer: Self-pay | Admitting: Family Medicine

## 2011-09-23 NOTE — Telephone Encounter (Signed)
Caller: Brooke RN [facility]; PCP: Lelon Perla.; CB#: [phone];; Call regarding Pt Has Been Seen for L. Leg Cellulitis and Wound TX that Has Now Healed.  Pt states the last time this happened, about a week after he was discharged from [facility] his leg "broke open again".  Nurse calling to ask if they need to do maintenance visits, or discharge him and wait to see if pt needs further treastment for an identified DX.

## 2011-09-23 NOTE — Telephone Encounter (Signed)
Spoke with [name] and she voiced understanding, she said the patient is doing ok right now but they will go out once a week and check on the patient. Orders will be faxed for signature.    KP

## 2011-09-23 NOTE — Telephone Encounter (Signed)
Yes , please do maintainance visits---esp now that he is alone

## 2011-09-28 ENCOUNTER — Other Ambulatory Visit: Payer: Self-pay | Admitting: Family Medicine

## 2011-10-03 ENCOUNTER — Other Ambulatory Visit: Payer: Self-pay | Admitting: Family Medicine

## 2011-10-03 NOTE — Telephone Encounter (Signed)
Last seen 08/06/11 and filled 08/30/11  #90. Please advise    KP

## 2011-10-21 DIAGNOSIS — Z0279 Encounter for issue of other medical certificate: Secondary | ICD-10-CM

## 2011-11-05 ENCOUNTER — Telehealth: Payer: Self-pay | Admitting: *Deleted

## 2011-11-05 NOTE — Telephone Encounter (Signed)
Pt daughter Nelida Gores called in to advise that she is now pt POA and notes he has medication in the house however she is unaware of how he takes it nor what he takes and wanted to know if she needs to come in for apt to discuss, noted pt still has a home health nurse in the home and advised pt daughter to contact the home health rep to discuss medications per they will have an updated list, also gave pt daughter fax number to send the POA papers for pt records, advised pt can call back later in the day when available to give a verbal order to release information to his daughter until he comes in for the next office visit in which he can sign DPR, daughter understood and will have pt call in for verbal DPR and seek assistance from home health rep as to medication regimen.

## 2011-11-06 ENCOUNTER — Other Ambulatory Visit: Payer: Self-pay | Admitting: Family Medicine

## 2011-11-06 MED ORDER — ROSUVASTATIN CALCIUM 20 MG PO TABS: ORAL_TABLET | ORAL | Status: DC

## 2011-11-06 NOTE — Telephone Encounter (Signed)
Refill crestor 20mg  tablets, #90, take one tablet by mouth daily, last fill 6.7.13 Last ov 6.4.13 acute

## 2011-11-07 ENCOUNTER — Telehealth: Payer: Self-pay

## 2011-11-07 DIAGNOSIS — I4891 Unspecified atrial fibrillation: Secondary | ICD-10-CM

## 2011-11-07 DIAGNOSIS — Z0279 Encounter for issue of other medical certificate: Secondary | ICD-10-CM

## 2011-11-07 DIAGNOSIS — E119 Type 2 diabetes mellitus without complications: Secondary | ICD-10-CM

## 2011-11-07 DIAGNOSIS — E785 Hyperlipidemia, unspecified: Secondary | ICD-10-CM

## 2011-11-07 NOTE — Telephone Encounter (Signed)
Ok per Dr.LOwne to Fax orders for fasting labs to [facility]--- Lipid , Hep Bmp, A1c an PT and INR

## 2011-11-07 NOTE — Telephone Encounter (Signed)
Last seen 08/06/11 and filled 10/03/11 # 90. Please advise    KP

## 2011-11-09 ENCOUNTER — Other Ambulatory Visit: Payer: Self-pay | Admitting: Family Medicine

## 2011-11-11 NOTE — Telephone Encounter (Signed)
Refill done.  

## 2011-12-07 ENCOUNTER — Other Ambulatory Visit: Payer: Self-pay | Admitting: Family Medicine

## 2011-12-11 ENCOUNTER — Telehealth: Payer: Self-pay | Admitting: *Deleted

## 2011-12-11 NOTE — Telephone Encounter (Signed)
Ok given    [employer]

## 2011-12-11 NOTE — Telephone Encounter (Signed)
Incoming call from [place] with [facility] [phone] requesting an extension for pt to continue use of the [employer] and the services for home health to go out to the home per notes re certification time, this nurse gave verbal order for Amber to implement both orders to continue [employer] and home health aid services, MD Laury Axon made aware verbally

## 2011-12-13 ENCOUNTER — Other Ambulatory Visit: Payer: Self-pay | Admitting: Family Medicine

## 2011-12-13 MED ORDER — ALLOPURINOL 100 MG PO TABS: 100.0000 mg | ORAL_TABLET | Freq: Every day | ORAL | Status: DC

## 2011-12-13 NOTE — Telephone Encounter (Signed)
Refill done.  

## 2011-12-13 NOTE — Telephone Encounter (Signed)
refill Allopurinol (Tab) 100 MG TAKE 1 TABLET BY MOUTH DAILY #30  last fill 9.9.13---last ov 6.4.13 acute

## 2012-01-01 ENCOUNTER — Other Ambulatory Visit: Payer: Self-pay | Admitting: Family Medicine

## 2012-01-01 MED ORDER — HYDROCODONE-ACETAMINOPHEN 7.5-750 MG PO TABS
1.0000 | ORAL_TABLET | Freq: Four times a day (QID) | ORAL | Status: DC | PRN
Start: 2012-01-01 — End: 2012-02-10

## 2012-01-01 NOTE — Addendum Note (Signed)
Addended by: Arnette Norris on: 01/01/2012 05:39 PM   Modules accepted: Orders

## 2012-01-01 NOTE — Telephone Encounter (Signed)
Last seen 08/06/11 and filled 11/06/11 # 90. Please advise     KP

## 2012-01-02 ENCOUNTER — Other Ambulatory Visit: Payer: Self-pay | Admitting: Family Medicine

## 2012-01-06 ENCOUNTER — Other Ambulatory Visit: Payer: Self-pay | Admitting: Family Medicine

## 2012-01-06 NOTE — Telephone Encounter (Signed)
Lab orders have been re-faxed to [facility] [phone] to have fasting labs done

## 2012-01-10 ENCOUNTER — Other Ambulatory Visit: Payer: Self-pay

## 2012-01-10 NOTE — Telephone Encounter (Signed)
PA initiated on Humalog and has been approved through January 05, 2013.  Copy faxed to the pharmacy sent to be scanned.     KP

## 2012-01-31 ENCOUNTER — Telehealth: Payer: Self-pay

## 2012-01-31 NOTE — Telephone Encounter (Signed)
PA completed and approved on Humalog 100 ml kwikpen  25 ml subq tid with meal through 01/05/13. Letter was sent to be scanned.     KP

## 2012-02-01 ENCOUNTER — Other Ambulatory Visit: Payer: Self-pay | Admitting: Family Medicine

## 2012-02-05 ENCOUNTER — Other Ambulatory Visit: Payer: Self-pay | Admitting: Family Medicine

## 2012-02-05 NOTE — Telephone Encounter (Signed)
Rx sent.    MW 

## 2012-02-06 ENCOUNTER — Other Ambulatory Visit: Payer: Self-pay | Admitting: Family Medicine

## 2012-02-06 NOTE — Telephone Encounter (Signed)
Rx sent.    MW 

## 2012-02-10 ENCOUNTER — Other Ambulatory Visit: Payer: Self-pay | Admitting: Family Medicine

## 2012-02-10 NOTE — Telephone Encounter (Signed)
Last seen 08/06/11 and filled 12/22/11 #90. Please advise    KP

## 2012-02-18 DIAGNOSIS — E1169 Type 2 diabetes mellitus with other specified complication: Secondary | ICD-10-CM

## 2012-02-18 DIAGNOSIS — M9080 Osteopathy in diseases classified elsewhere, unspecified site: Secondary | ICD-10-CM

## 2012-02-18 DIAGNOSIS — L02419 Cutaneous abscess of limb, unspecified: Secondary | ICD-10-CM

## 2012-02-18 DIAGNOSIS — L03119 Cellulitis of unspecified part of limb: Secondary | ICD-10-CM

## 2012-02-18 DIAGNOSIS — I10 Essential (primary) hypertension: Secondary | ICD-10-CM

## 2012-02-20 ENCOUNTER — Other Ambulatory Visit: Payer: Self-pay | Admitting: Family Medicine

## 2012-03-08 ENCOUNTER — Other Ambulatory Visit: Payer: Self-pay | Admitting: Family Medicine

## 2012-03-19 ENCOUNTER — Other Ambulatory Visit: Payer: Self-pay | Admitting: Family Medicine

## 2012-03-19 NOTE — Telephone Encounter (Signed)
Last seen 08/06/11 and filled 02/10/12 # 90. Please advise     KP

## 2012-03-22 ENCOUNTER — Other Ambulatory Visit: Payer: Self-pay | Admitting: Family Medicine

## 2012-03-30 ENCOUNTER — Other Ambulatory Visit: Payer: Self-pay | Admitting: Family Medicine

## 2012-04-03 ENCOUNTER — Other Ambulatory Visit: Payer: Self-pay | Admitting: Family Medicine

## 2012-04-09 ENCOUNTER — Ambulatory Visit: Payer: Medicare Other | Admitting: Family Medicine

## 2012-04-16 ENCOUNTER — Ambulatory Visit: Payer: Medicare Other | Admitting: Family Medicine

## 2012-04-20 DIAGNOSIS — I10 Essential (primary) hypertension: Secondary | ICD-10-CM

## 2012-04-20 DIAGNOSIS — L02419 Cutaneous abscess of limb, unspecified: Secondary | ICD-10-CM

## 2012-04-20 DIAGNOSIS — L03119 Cellulitis of unspecified part of limb: Secondary | ICD-10-CM

## 2012-04-20 DIAGNOSIS — E1169 Type 2 diabetes mellitus with other specified complication: Secondary | ICD-10-CM

## 2012-04-20 DIAGNOSIS — M9080 Osteopathy in diseases classified elsewhere, unspecified site: Secondary | ICD-10-CM

## 2012-04-29 ENCOUNTER — Other Ambulatory Visit: Payer: Self-pay | Admitting: Family Medicine

## 2012-04-29 ENCOUNTER — Encounter: Payer: Self-pay | Admitting: Lab

## 2012-04-30 ENCOUNTER — Other Ambulatory Visit: Payer: Self-pay | Admitting: Family Medicine

## 2012-04-30 ENCOUNTER — Encounter: Payer: Self-pay | Admitting: Family Medicine

## 2012-04-30 ENCOUNTER — Ambulatory Visit (INDEPENDENT_AMBULATORY_CARE_PROVIDER_SITE_OTHER): Payer: Medicare Other | Admitting: Family Medicine

## 2012-04-30 VITALS — BP 100/68 | HR 85 | Temp 96.7°F

## 2012-04-30 DIAGNOSIS — G547 Phantom limb syndrome without pain: Secondary | ICD-10-CM

## 2012-04-30 DIAGNOSIS — N259 Disorder resulting from impaired renal tubular function, unspecified: Secondary | ICD-10-CM

## 2012-04-30 DIAGNOSIS — I10 Essential (primary) hypertension: Secondary | ICD-10-CM

## 2012-04-30 DIAGNOSIS — G546 Phantom limb syndrome with pain: Secondary | ICD-10-CM

## 2012-04-30 DIAGNOSIS — B356 Tinea cruris: Secondary | ICD-10-CM

## 2012-04-30 DIAGNOSIS — E1149 Type 2 diabetes mellitus with other diabetic neurological complication: Secondary | ICD-10-CM

## 2012-04-30 DIAGNOSIS — E1129 Type 2 diabetes mellitus with other diabetic kidney complication: Secondary | ICD-10-CM

## 2012-04-30 DIAGNOSIS — I4891 Unspecified atrial fibrillation: Secondary | ICD-10-CM

## 2012-04-30 DIAGNOSIS — E785 Hyperlipidemia, unspecified: Secondary | ICD-10-CM

## 2012-04-30 DIAGNOSIS — N058 Unspecified nephritic syndrome with other morphologic changes: Secondary | ICD-10-CM

## 2012-04-30 DIAGNOSIS — E1142 Type 2 diabetes mellitus with diabetic polyneuropathy: Secondary | ICD-10-CM

## 2012-04-30 DIAGNOSIS — E039 Hypothyroidism, unspecified: Secondary | ICD-10-CM

## 2012-04-30 LAB — POCT INR: INR: 1

## 2012-04-30 MED ORDER — NYSTATIN 100000 UNIT/GM EX POWD: Freq: Three times a day (TID) | CUTANEOUS | Status: DC

## 2012-04-30 MED ORDER — HYDROCODONE-ACETAMINOPHEN 7.5-300 MG PO TABS: ORAL_TABLET | ORAL | Status: DC

## 2012-04-30 NOTE — Patient Instructions (Addendum)
Increase coumadin to 5 mg 2 tab MWF and 1 tab all other days---  Recheck 1 week

## 2012-04-30 NOTE — Assessment & Plan Note (Signed)
INR 1.0   Coumadin 5 mg 2 tab mwf and 1 tab all other days,   Recheck PT/ INR in 1 week

## 2012-04-30 NOTE — Progress Notes (Signed)
Pt could not use the bathroom

## 2012-04-30 NOTE — Assessment & Plan Note (Signed)
Per nephrology 

## 2012-04-30 NOTE — Assessment & Plan Note (Signed)
Check labs con't meds 

## 2012-04-30 NOTE — Assessment & Plan Note (Signed)
Check labs 

## 2012-04-30 NOTE — Progress Notes (Signed)
  Subjective:    Patient ID: [name], male    DOB: 02/09/1941, 72 y.o.   MRN: [id number]  HPI Pt here for f/u with his daughter that he only met 1 year ago for the first time.  He has not had ov with any Dr for over a year. His wife died 1 year ago .   He has a neighbor putting his meds out for him daily.  HYPERTENSION Disease Monitoring Blood pressure range-not checked  Chest pain- no      Dyspnea- yes Medications Compliance- good Lightheadedness- no   Edema- BKA   DIABETES Disease Monitoring Blood Sugar ranges-not checked Polyuria- no New Visual problems- yes Medications Compliance- good Hypoglycemic symptoms- no   HYPERLIPIDEMIA Disease Monitoring See symptoms for Hypertension Medications Compliance- good RUQ pain- no  Muscle aches- no  ROS See HPI above   PMH Smoking Status noted     Review of Systems As above    Objective:   Physical Exam BP 100/68  Pulse 85  Temp(Src) 96.7 F (35.9 C) (Tympanic)  SpO2 96% General appearance: alert, cooperative, appears stated age, no distress and morbidly obese Lungs: clear to auscultation bilaterally Heart: S1, S2 normal Extremities: BKA       Assessment & Plan:

## 2012-04-30 NOTE — Assessment & Plan Note (Signed)
con't meds  Check labs 

## 2012-05-01 ENCOUNTER — Other Ambulatory Visit: Payer: Self-pay | Admitting: Family Medicine

## 2012-05-01 LAB — HEPATIC FUNCTION PANEL
AST: 18 U/L (ref 0–37)
Alkaline Phosphatase: 40 U/L (ref 39–117)
Bilirubin, Direct: 0.2 mg/dL (ref 0.0–0.3)
Total Bilirubin: 0.8 mg/dL (ref 0.3–1.2)

## 2012-05-01 LAB — CBC WITH DIFFERENTIAL/PLATELET
Basophils Relative: 0.8 % (ref 0.0–3.0)
Eosinophils Relative: 2.1 % (ref 0.0–5.0)
Lymphocytes Relative: 14.8 % (ref 12.0–46.0)
Monocytes Relative: 1.5 % — ABNORMAL LOW (ref 3.0–12.0)
Neutrophils Relative %: 80.8 % — ABNORMAL HIGH (ref 43.0–77.0)
Platelets: 182 10*3/uL (ref 150.0–400.0)
RBC: 4.14 Mil/uL — ABNORMAL LOW (ref 4.22–5.81)
WBC: 7.2 10*3/uL (ref 4.5–10.5)

## 2012-05-01 LAB — LDL CHOLESTEROL, DIRECT: Direct LDL: 79.2 mg/dL

## 2012-05-01 LAB — BASIC METABOLIC PANEL
BUN: 31 mg/dL — ABNORMAL HIGH (ref 6–23)
Creatinine, Ser: 1.6 mg/dL — ABNORMAL HIGH (ref 0.4–1.5)
GFR: 45.91 mL/min — ABNORMAL LOW (ref >60.00)

## 2012-05-01 LAB — HEMOGLOBIN A1C: Hgb A1c MFr Bld: 11.3 % — ABNORMAL HIGH (ref 4.6–6.5)

## 2012-05-01 LAB — LIPID PANEL
HDL: 32.8 mg/dL — ABNORMAL LOW (ref >39.00)
Total CHOL/HDL Ratio: 4
VLDL: 41.6 mg/dL — ABNORMAL HIGH (ref 0.0–40.0)

## 2012-05-01 LAB — TSH: TSH: 3.69 u[IU]/mL (ref 0.35–5.50)

## 2012-05-07 ENCOUNTER — Other Ambulatory Visit: Payer: Self-pay | Admitting: Family Medicine

## 2012-05-09 ENCOUNTER — Other Ambulatory Visit: Payer: Self-pay | Admitting: Family Medicine

## 2012-05-20 ENCOUNTER — Other Ambulatory Visit: Payer: Self-pay | Admitting: Family Medicine

## 2012-05-22 ENCOUNTER — Encounter: Payer: Self-pay | Admitting: *Deleted

## 2012-05-27 ENCOUNTER — Other Ambulatory Visit: Payer: Self-pay | Admitting: Family Medicine

## 2012-06-01 ENCOUNTER — Telehealth: Payer: Self-pay | Admitting: Family Medicine

## 2012-06-01 DIAGNOSIS — S88111A Complete traumatic amputation at level between knee and ankle, right lower leg, initial encounter: Secondary | ICD-10-CM

## 2012-06-01 NOTE — Telephone Encounter (Signed)
Please advise      KP 

## 2012-06-01 NOTE — Telephone Encounter (Signed)
[name] from [facility] called stating the home health nurse is having trouble transferring the patient. They would like to have PT and OT evals and need orders. Fax to [phone].

## 2012-06-01 NOTE — Telephone Encounter (Signed)
Ok to send order. 

## 2012-06-02 NOTE — Telephone Encounter (Signed)
Order faxed to [facility].     KP 

## 2012-06-03 ENCOUNTER — Encounter (HOSPITAL_COMMUNITY): Payer: Self-pay | Admitting: *Deleted

## 2012-06-03 ENCOUNTER — Emergency Department (HOSPITAL_COMMUNITY): Payer: Medicare Other

## 2012-06-03 ENCOUNTER — Inpatient Hospital Stay (HOSPITAL_COMMUNITY)
Admission: EM | Admit: 2012-06-03 | Discharge: 2012-06-12 | DRG: 464 | Disposition: A | Discharge status: Home / Self Care | Payer: Medicare Other | Attending: Internal Medicine | Admitting: Internal Medicine

## 2012-06-03 DIAGNOSIS — D649 Anemia, unspecified: Secondary | ICD-10-CM

## 2012-06-03 DIAGNOSIS — I4891 Unspecified atrial fibrillation: Secondary | ICD-10-CM | POA: Diagnosis present

## 2012-06-03 DIAGNOSIS — L03116 Cellulitis of left lower limb: Secondary | ICD-10-CM

## 2012-06-03 DIAGNOSIS — F329 Major depressive disorder, single episode, unspecified: Secondary | ICD-10-CM | POA: Diagnosis present

## 2012-06-03 DIAGNOSIS — M86179 Other acute osteomyelitis, unspecified ankle and foot: Principal | ICD-10-CM | POA: Diagnosis present

## 2012-06-03 DIAGNOSIS — E039 Hypothyroidism, unspecified: Secondary | ICD-10-CM

## 2012-06-03 DIAGNOSIS — N058 Unspecified nephritic syndrome with other morphologic changes: Secondary | ICD-10-CM | POA: Diagnosis present

## 2012-06-03 DIAGNOSIS — E871 Hypo-osmolality and hyponatremia: Secondary | ICD-10-CM | POA: Diagnosis present

## 2012-06-03 DIAGNOSIS — E1165 Type 2 diabetes mellitus with hyperglycemia: Secondary | ICD-10-CM | POA: Diagnosis present

## 2012-06-03 DIAGNOSIS — L97309 Non-pressure chronic ulcer of unspecified ankle with unspecified severity: Secondary | ICD-10-CM | POA: Diagnosis present

## 2012-06-03 DIAGNOSIS — E785 Hyperlipidemia, unspecified: Secondary | ICD-10-CM

## 2012-06-03 DIAGNOSIS — N390 Urinary tract infection, site not specified: Secondary | ICD-10-CM | POA: Diagnosis present

## 2012-06-03 DIAGNOSIS — E11622 Type 2 diabetes mellitus with other skin ulcer: Secondary | ICD-10-CM

## 2012-06-03 DIAGNOSIS — Z8669 Personal history of other diseases of the nervous system and sense organs: Secondary | ICD-10-CM

## 2012-06-03 DIAGNOSIS — D510 Vitamin B12 deficiency anemia due to intrinsic factor deficiency: Secondary | ICD-10-CM | POA: Diagnosis present

## 2012-06-03 DIAGNOSIS — E876 Hypokalemia: Secondary | ICD-10-CM | POA: Diagnosis present

## 2012-06-03 DIAGNOSIS — E538 Deficiency of other specified B group vitamins: Secondary | ICD-10-CM

## 2012-06-03 DIAGNOSIS — S88119A Complete traumatic amputation at level between knee and ankle, unspecified lower leg, initial encounter: Secondary | ICD-10-CM

## 2012-06-03 DIAGNOSIS — L03115 Cellulitis of right lower limb: Secondary | ICD-10-CM

## 2012-06-03 DIAGNOSIS — F3289 Other specified depressive episodes: Secondary | ICD-10-CM | POA: Diagnosis present

## 2012-06-03 DIAGNOSIS — B961 Klebsiella pneumoniae [K. pneumoniae] as the cause of diseases classified elsewhere: Secondary | ICD-10-CM | POA: Diagnosis present

## 2012-06-03 DIAGNOSIS — I129 Hypertensive chronic kidney disease with stage 1 through stage 4 chronic kidney disease, or unspecified chronic kidney disease: Secondary | ICD-10-CM | POA: Diagnosis present

## 2012-06-03 DIAGNOSIS — N189 Chronic kidney disease, unspecified: Secondary | ICD-10-CM | POA: Diagnosis present

## 2012-06-03 DIAGNOSIS — E1129 Type 2 diabetes mellitus with other diabetic kidney complication: Secondary | ICD-10-CM

## 2012-06-03 DIAGNOSIS — L97322 Non-pressure chronic ulcer of left ankle with fat layer exposed: Secondary | ICD-10-CM

## 2012-06-03 DIAGNOSIS — L02419 Cutaneous abscess of limb, unspecified: Secondary | ICD-10-CM | POA: Diagnosis present

## 2012-06-03 DIAGNOSIS — I10 Essential (primary) hypertension: Secondary | ICD-10-CM | POA: Diagnosis present

## 2012-06-03 DIAGNOSIS — N179 Acute kidney failure, unspecified: Secondary | ICD-10-CM | POA: Diagnosis present

## 2012-06-03 HISTORY — DX: Non-pressure chronic ulcer of other part of left foot with unspecified severity: L97.529

## 2012-06-03 HISTORY — DX: Type 2 diabetes mellitus without complications: E11.9

## 2012-06-03 LAB — COMPREHENSIVE METABOLIC PANEL
CO2: 25 mEq/L (ref 19–32)
Calcium: 9.2 mg/dL (ref 8.4–10.5)
Chloride: 93 mEq/L — ABNORMAL LOW (ref 96–112)
Glucose, Bld: 357 mg/dL — ABNORMAL HIGH (ref 70–99)
Sodium: 130 mEq/L — ABNORMAL LOW (ref 135–145)

## 2012-06-03 LAB — URINALYSIS, ROUTINE W REFLEX MICROSCOPIC
Protein, ur: NEGATIVE mg/dL
Urobilinogen, UA: 1 mg/dL (ref 0.0–1.0)

## 2012-06-03 LAB — CBC WITH DIFFERENTIAL/PLATELET
Eosinophils Relative: 2 % (ref 0–5)
HCT: 33 % — ABNORMAL LOW (ref 39.0–52.0)
Lymphocytes Relative: 9 % — ABNORMAL LOW (ref 12–46)
Lymphs Abs: 0.9 10*3/uL (ref 0.7–4.0)
MCV: 87.1 fL (ref 78.0–100.0)
Neutro Abs: 7.9 10*3/uL — ABNORMAL HIGH (ref 1.7–7.7)
Platelets: 206 10*3/uL (ref 150–400)
RBC: 3.79 MIL/uL — ABNORMAL LOW (ref 4.22–5.81)
WBC: 9.6 10*3/uL (ref 4.0–10.5)

## 2012-06-03 LAB — LACTIC ACID, PLASMA: Lactic Acid, Venous: 1.2 mmol/L (ref 0.5–2.2)

## 2012-06-03 MED ORDER — VANCOMYCIN HCL 10 G IV SOLR
2500.0000 mg | INTRAVENOUS | Status: AC
  Administered 2012-06-03: 2500 mg via INTRAVENOUS
  Filled 2012-06-03: qty 2500

## 2012-06-03 MED ORDER — VANCOMYCIN HCL 10 G IV SOLR
2000.0000 mg | INTRAVENOUS | Status: AC
  Administered 2012-06-04 – 2012-06-11 (×8): 2000 mg via INTRAVENOUS
  Filled 2012-06-03 (×8): qty 2000

## 2012-06-03 MED ORDER — ACETAMINOPHEN 500 MG PO TABS
1000.0000 mg | ORAL_TABLET | Freq: Once | ORAL | Status: AC
  Administered 2012-06-03: 1000 mg via ORAL
  Filled 2012-06-03: qty 2

## 2012-06-03 NOTE — ED Notes (Signed)
Patient transported to X-ray 

## 2012-06-03 NOTE — ED Provider Notes (Signed)
History     CSN: [id number]  Arrival date & time 06/03/12  1747   First MD Initiated Contact with Patient 06/03/12 1852      Chief Complaint  Patient presents with  . Wound Infection     HPI Pt was seen at 1900.   Per pt, c/o gradual onset and worsening of persistent left anterior ankle/foot wound for the past 1 week.  Pt states the area "started as a sore" and then now has "gotten deeper."   Pt also c/o redness to left lower leg, as well as abrasions and redness to right lower medial thigh that have been worsening over the past week. States a Home Health RN has been coming to his home to dress the wounds. States he "usually has a wrap" around his left lower leg. Denies fevers, no CP/SOB, no abd pain, no focal motor weakness.    Past Medical History  Diagnosis Date  . Depression   . Diabetes mellitus   . Hyperlipidemia   . Hypertension   . Chronic kidney disease   . CHF (congestive heart failure)   . Sleep apnea   . Gout   . Osteoarthritis     Past Surgical History  Procedure Laterality Date  . Leg amputation below knee    . Hernia repair    . Orchiectomy    . Rotator cuff repair      Family History  Problem Relation Age of Onset  . Diabetes Brother   . Heart disease Mother   . Kidney disease Mother     History  Substance Use Topics  . Smoking status: Never Smoker   . Smokeless tobacco: Never Used  . Alcohol Use: No      Review of Systems ROS: Statement: All systems negative except as marked or noted in the HPI; Constitutional: Negative for fever and chills. ; ; Eyes: Negative for eye pain, redness and discharge. ; ; ENMT: Negative for ear pain, hoarseness, nasal congestion, sinus pressure and sore throat. ; ; Cardiovascular: Negative for chest pain, palpitations, diaphoresis, dyspnea and peripheral edema. ; ; Respiratory: Negative for cough, wheezing and stridor. ; ; Gastrointestinal: Negative for nausea, vomiting, diarrhea, abdominal pain, blood in stool,  hematemesis, jaundice and rectal bleeding. . ; ; Genitourinary: Negative for dysuria, flank pain and hematuria. ; ; Musculoskeletal: Negative for back pain and neck pain. Negative for swelling and trauma.; ; Skin: +rash, ulcer. Negative for pruritus, bruising.; ; Neuro: Negative for headache, lightheadedness and neck stiffness. Negative for weakness, altered level of consciousness , altered mental status, extremity weakness, paresthesias, involuntary movement, seizure and syncope.       Allergies  Review of patient's allergies indicates no known allergies.  Home Medications   Current Outpatient Rx  Name  Route  Sig  Dispense  Refill  . allopurinol (ZYLOPRIM) 100 MG tablet   Oral   Take 100 mg by mouth daily.         Marland Kitchen diltiazem (DILACOR XR) 240 MG 24 hr capsule   Oral   Take 240 mg by mouth daily.         . fenofibrate 160 MG tablet   Oral   Take 160 mg by mouth daily.         Marland Kitchen FLUoxetine (PROZAC) 20 MG capsule   Oral   Take 20 mg by mouth 2 (two) times daily.         . furosemide (LASIX) 20 MG tablet   Oral   Take  20 mg by mouth daily.         Marland Kitchen gabapentin (NEURONTIN) 300 MG capsule   Oral   Take 300 mg by mouth 3 (three) times daily as needed (for nerve pain.).         Marland Kitchen Hydrocodone-Acetaminophen 7.5-300 MG TABS   Oral   Take 1 tablet by mouth every 6 (six) hours as needed (for pain.).         Marland Kitchen insulin glargine (LANTUS) 100 UNIT/ML injection   Subcutaneous   Inject 40 Units into the skin 2 (two) times daily.         . insulin lispro (HUMALOG KWIKPEN) 100 UNIT/ML injection   Subcutaneous   Inject 25 Units into the skin 3 (three) times daily before meals.         Marland Kitchen levothyroxine (SYNTHROID, LEVOTHROID) 200 MCG tablet   Oral   Take 200 mcg by mouth daily before breakfast.         . Liraglutide (VICTOZA) 18 MG/3ML SOLN injection   Subcutaneous   Inject 1.2 mg into the skin daily.         . pantoprazole (PROTONIX) 40 MG tablet   Oral    Take 40 mg by mouth daily.         . potassium chloride (K-DUR,KLOR-CON) 10 MEQ tablet   Oral   Take 10 mEq by mouth daily.         Marland Kitchen rOPINIRole (REQUIP) 2 MG tablet   Oral   Take 2 mg by mouth at bedtime.         . rosuvastatin (CRESTOR) 20 MG tablet   Oral   Take 20 mg by mouth daily.         . tamsulosin (FLOMAX) 0.4 MG CAPS   Oral   Take 0.4 mg by mouth daily.         Marland Kitchen warfarin (COUMADIN) 5 MG tablet   Oral   Take 5-10 mg by mouth daily. 2 tabs daily on MWF; 1 tab daily otherwise.         . nystatin (MYCOSTATIN) powder   Topical   Apply topically 3 (three) times daily.   60 g   3     BP 132/69  Pulse 114  Temp(Src) 101.2 F (38.4 C) (Oral)  Resp 16  Wt 360 lb (163.295 kg)  BMI 51.65 kg/m2  SpO2 93%  Physical Exam 1905: Physical examination:  Nursing notes reviewed; Vital signs and O2 SAT reviewed;  Constitutional: Well developed, Well nourished, Well hydrated, In no acute distress; Head:  Normocephalic, atraumatic; Eyes: EOMI, PERRL, No scleral icterus; ENMT: Mouth and pharynx normal, Mucous membranes moist; Neck: Supple, Full range of motion, No lymphadenopathy; Cardiovascular: Regular rate and rhythm, No gallop; Respiratory: Breath sounds clear & equal bilaterally, No rales, rhonchi, wheezes.  Speaking full sentences with ease, Normal respiratory effort/excursion; Chest: Nontender, Movement normal; Abdomen: Soft, Nontender, Nondistended, Normal bowel sounds;; Extremities: Pulses normal, No tenderness, No edema, No calf edema or asymmetry.; Neuro: AA&Ox3, Major CN grossly intact.  Speech clear. No gross focal motor or sensory deficits in extremities.; Skin: Color normal, Warm, Dry; +erythema and superficial abrasions to right medial distal thigh without bleeding, ecchymosis or drainage.  +left proximal lower leg with erythema, +left anterior ankle/proximal dorsal foot with approx 5-6cm diameter deep ulceration with foul odor, purulent drainage and  surrounding erythema, no bleeding.   ED Course  Procedures     MDM  MDM Reviewed: previous chart, nursing note and  vitals Reviewed previous: labs Interpretation: labs and x-ray   Results for orders placed during the hospital encounter of 06/03/12  CBC WITH DIFFERENTIAL      Result Value Range   WBC 9.6  4.0 - 10.5 K/uL   RBC 3.79 (*) 4.22 - 5.81 MIL/uL   Hemoglobin 11.3 (*) 13.0 - 17.0 g/dL   HCT 16.1 (*) 09.6 - 04.5 %   MCV 87.1  78.0 - 100.0 fL   MCH 29.8  26.0 - 34.0 pg   MCHC 34.2  30.0 - 36.0 g/dL   RDW 40.9  81.1 - 91.4 %   Platelets 206  150 - 400 K/uL   Neutrophils Relative 83 (*) 43 - 77 %   Neutro Abs 7.9 (*) 1.7 - 7.7 K/uL   Lymphocytes Relative 9 (*) 12 - 46 %   Lymphs Abs 0.9  0.7 - 4.0 K/uL   Monocytes Relative 6  3 - 12 %   Monocytes Absolute 0.6  0.1 - 1.0 K/uL   Eosinophils Relative 2  0 - 5 %   Eosinophils Absolute 0.2  0.0 - 0.7 K/uL   Basophils Relative 0  0 - 1 %   Basophils Absolute 0.0  0.0 - 0.1 K/uL  COMPREHENSIVE METABOLIC PANEL      Result Value Range   Sodium 130 (*) 135 - 145 mEq/L   Potassium 3.4 (*) 3.5 - 5.1 mEq/L   Chloride 93 (*) 96 - 112 mEq/L   CO2 25  19 - 32 mEq/L   Glucose, Bld 357 (*) 70 - 99 mg/dL   BUN 31 (*) 6 - 23 mg/dL   Creatinine, Ser 7.82 (*) 0.50 - 1.35 mg/dL   Calcium 9.2  8.4 - 95.6 mg/dL   Total Protein 7.3  6.0 - 8.3 g/dL   Albumin 2.9 (*) 3.5 - 5.2 g/dL   AST 12  0 - 37 U/L   ALT 11  0 - 53 U/L   Alkaline Phosphatase 52  39 - 117 U/L   Total Bilirubin 0.5  0.3 - 1.2 mg/dL   GFR calc non Af Amer 46 (*) >90 mL/min   GFR calc Af Amer 53 (*) >90 mL/min  LACTIC ACID, PLASMA      Result Value Range   Lactic Acid, Venous 1.2  0.5 - 2.2 mmol/L   Dg Ankle Complete Left 06/03/2012  *RADIOLOGY REPORT*  Clinical Data: Open wound on the dorsum of the forefoot.  LEFT ANKLE COMPLETE - 3+ VIEW  Comparison: None.  Findings: Osteopenia.  No acute fracture.  No dislocation. Spurring at the inferior calcaneus.  Degenerative  changes in the ankle joint.  IMPRESSION: Chronic changes.  No acute bony pathology.   Original Report Authenticated By: Jolaine Click, M.D.    Dg Foot Complete Left 06/03/2012  *RADIOLOGY REPORT*  Clinical Data: Open wound on foot  LEFT FOOT - COMPLETE 3+ VIEW  Comparison: 12/20/2009  Findings: Severe osteopenia.  Periosteal reaction along the third and fourth metatarsals is stable.  No acute fracture.  No dislocation.  Erosion at the dorsum of the navicular is stable. No definite destructive bone lesion.  IMPRESSION: No acute bony pathology.  Chronic changes.   Original Report Authenticated By: Jolaine Click, M.D.    Results for JERMARCUS, MCFADYEN (MRN [id number]) as of 06/03/2012 22:01  Ref. Range 08/06/2010 11:52 12/19/2010 10:39 04/11/2011 10:05 04/30/2012 16:51 06/03/2012 19:30  Sodium Latest Range: 135-145 mEq/L 139 138 139 136 130 (L)  BUN Latest Range: 6-23 mg/dL  28 (H) 28 (H) 30 (H) 31 (H) 31 (H)  Creatinine Latest Range: 0.50-1.35 mg/dL 1.9 (H) 1.6 (H) 1.7 (H) 1.6 (H) 1.47 (H)   Results for HARSHAL, SIRMON (MRN [id number]) as of 06/03/2012 22:01  Ref. Range 01/11/2010 14:05 08/06/2010 11:52 12/19/2010 10:39 04/30/2012 16:51 06/03/2012 19:30  Hemoglobin Latest Range: 13.0-17.0 g/dL 9.2 (L) 65.7 (L) 84.6 (L) 12.3 (L) 11.3 (L)  HCT Latest Range: 39.0-52.0 % 27.6 (L) 36.0 (L) 36.2 (L) 36.8 (L) 33.0 (L)    2145:  +doppler LLE pedal pulses.  IV vancomycin started for bilat LE's cellulitis and left ankle deep open ulcer. Glucose elevated per known hx DM; not acidotic.  Na corrects to 134 for elevated glucose. APAP given for fever.  Dx and testing d/w pt and family.  Questions answered.  Verb understanding, agreeable to admit.  T/C to [facility] Dr. Sharyon Medicus, case discussed, including:  HPI, pertinent PM/SHx, VS/PE, dx testing, ED course and treatment:  Agreeable to admit.          Laray Anger, DO 06/04/12 2148

## 2012-06-03 NOTE — H&P (Signed)
[facility]                                                                                    Patient Demographics  [name], is a 72 y.o. male  CSN: [id number]  MRN: [id number]  DOB - 05/30/1940  Admit Date - 06/03/2012  Outpatient Primary MD for the patient is Loreen Freud, DO   With History of -  Past Medical History  Diagnosis Date  . Depression   . Diabetes mellitus   . Hyperlipidemia   . Hypertension   . Chronic kidney disease   . CHF (congestive heart failure)   . Sleep apnea   . Gout   . Osteoarthritis       Past Surgical History  Procedure Laterality Date  . Leg amputation below knee    . Hernia repair    . Orchiectomy    . Rotator cuff repair      in for   Chief Complaint  Patient presents with  . Wound Infection     HPI  [name]  is a 72 y.o. 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 3 days ago he noticed the presence of left foot ulcer on the dorsum of his left foot which quickly worsened and now is  Foul-smelling. No fever or chills reported. X-rays done in the emergency room did not show any ulcer mellitus.    Review of Systems    In addition to the HPI above,  No Fever-chills, No Headache, No changes with Vision or hearing, No problems swallowing food or Liquids, No Chest pain, Cough or Shortness of Breath, No Abdominal pain, No Nausea or Vommitting, Bowel movements are regular, No Blood in stool or Urine, No dysuria, No new skin rashes or bruises, No new joints pains-aches,  No new weakness, tingling, numbness in any extremity, No recent weight gain or loss, No polyuria, polydypsia or polyphagia, No significant Mental Stressors.  A full 10 point Review of Systems was done, except as stated above, all other Review of  Systems were negative.   Social History History  Substance Use Topics  . Smoking status: Never Smoker   . Smokeless tobacco: Never Used  . Alcohol Use: No     Family History Family History  Problem Relation Age of Onset  . Diabetes Brother   . Heart disease Mother   . Kidney disease Mother      Prior to Admission medications   Medication Sig Start Date End Date Taking? Authorizing Provider  allopurinol (ZYLOPRIM) 100 MG tablet Take 100 mg by mouth daily.   Yes Historical Provider, MD  diltiazem (DILACOR XR) 240 MG 24 hr capsule Take 240 mg by mouth daily.   Yes Historical Provider, MD  fenofibrate 160 MG tablet Take 160 mg by mouth daily.   Yes Historical Provider, MD  FLUoxetine (PROZAC) 20 MG capsule Take 20 mg by mouth 2 (two) times daily.   Yes Historical Provider, MD  furosemide (  LASIX) 20 MG tablet Take 20 mg by mouth daily.   Yes Historical Provider, MD  gabapentin (NEURONTIN) 300 MG capsule Take 300 mg by mouth 3 (three) times daily as needed (for nerve pain.).   Yes Historical Provider, MD  Hydrocodone-Acetaminophen 7.5-300 MG TABS Take 1 tablet by mouth every 6 (six) hours as needed (for pain.).   Yes Historical Provider, MD  insulin glargine (LANTUS) 100 UNIT/ML injection Inject 40 Units into the skin 2 (two) times daily.   Yes Historical Provider, MD  insulin lispro (HUMALOG KWIKPEN) 100 UNIT/ML injection Inject 25 Units into the skin 3 (three) times daily before meals.   Yes Historical Provider, MD  levothyroxine (SYNTHROID, LEVOTHROID) 200 MCG tablet Take 200 mcg by mouth daily before breakfast.   Yes Historical Provider, MD  Liraglutide (VICTOZA) 18 MG/3ML SOLN injection Inject 1.2 mg into the skin daily.   Yes Historical Provider, MD  pantoprazole (PROTONIX) 40 MG tablet Take 40 mg by mouth daily.   Yes Historical Provider, MD  potassium chloride (K-DUR,KLOR-CON) 10 MEQ tablet Take 10 mEq by mouth daily.   Yes Historical Provider, MD  rOPINIRole (REQUIP) 2 MG  tablet Take 2 mg by mouth at bedtime.   Yes Historical Provider, MD  rosuvastatin (CRESTOR) 20 MG tablet Take 20 mg by mouth daily.   Yes Historical Provider, MD  tamsulosin (FLOMAX) 0.4 MG CAPS Take 0.4 mg by mouth daily.   Yes Historical Provider, MD  warfarin (COUMADIN) 5 MG tablet Take 5-10 mg by mouth daily. 2 tabs daily on MWF; 1 tab daily otherwise.   Yes Historical Provider, MD  nystatin (MYCOSTATIN) powder Apply topically 3 (three) times daily. 04/30/12   Lelon Perla, DO    No Known Allergies  Physical Exam  Vitals  Blood pressure 117/77, pulse 105, temperature 98.7 F (37.1 C), temperature source Oral, resp. rate 18, weight 163.295 kg (360 lb), SpO2 94.00%.   1. General elderly obese white male lying in bed in no acute distress. Daughter at bedside  2. Normal affect and insight, Not Suicidal or Homicidal, Awake Alert, Oriented X 3.  3. No F.N deficits, ALL C.Nerves Intact, Strength 5/5 all 4 extremities, Sensation intact all 4 extremities, Plantars down going.  4. Ears and Eyes appear Normal, Conjunctivae clear, PERRLA. Moist Oral Mucosa.  5. Supple Neck, No JVD, No cervical lymphadenopathy appriciated, No Carotid Bruits.  6. Symmetrical Chest wall movement, Good air movement bilaterally, CTAB.  7. RRR, No Gallops, Rubs or Murmurs, No Parasternal Heave.  8. Positive Bowel Sounds, Abdomen Soft, Non tender, No organomegaly appriciated,No rebound -guarding or rigidity.  9. Right lower extremity; below knee amputation with scratch marks suggestive of contact dermatitis with cellulitis  10. Left lower extremity multiple scratches noted at the inner side of left leg with a healing ulcer just below left knee and a 7x5 cm open ulcer on the dorsum of the left foot with foul-smelling odor and fascia is visible, unstageable   Data Review  CBC  Recent Labs Lab 06/03/12 1930  WBC 9.6  HGB 11.3*  HCT 33.0*  PLT 206  MCV 87.1  MCH 29.8  MCHC 34.2  RDW 14.8  LYMPHSABS  0.9  MONOABS 0.6  EOSABS 0.2  BASOSABS 0.0   ------------------------------------------------------------------------------------------------------------------  Chemistries   Recent Labs Lab 06/03/12 1930  NA 130*  K 3.4*  CL 93*  CO2 25  GLUCOSE 357*  BUN 31*  CREATININE 1.47*  CALCIUM 9.2  AST 12  ALT 11  ALKPHOS  52  BILITOT 0.5   ------------------------------------------------------------------------------------------------------------------ CrCl is unknown because both a height and weight (above a minimum accepted value) are required for this calculation. ------------------------------------------------------------------------------------------------------------------ No results found for this basename: TSH, T4TOTAL, FREET3, T3FREE, THYROIDAB,  in the last 72 hours     ---------------------------------------------------------------------------------------------------------------  Urinalysis    Component Value Date/Time   COLORURINE YELLOW 06/03/2012 2020   APPEARANCEUR CLOUDY* 06/03/2012 2020   LABSPEC 1.024 06/03/2012 2020   PHURINE 5.0 06/03/2012 2020   GLUCOSEU >1000* 06/03/2012 2020   HGBUR SMALL* 06/03/2012 2020   HGBUR negative 08/19/2008 0931   BILIRUBINUR NEGATIVE 06/03/2012 2020   BILIRUBINUR neg 12/19/2010 1129   KETONESUR NEGATIVE 06/03/2012 2020   PROTEINUR NEGATIVE 06/03/2012 2020   UROBILINOGEN 1.0 06/03/2012 2020   UROBILINOGEN 0.2 12/19/2010 1129   NITRITE POSITIVE* 06/03/2012 2020   NITRITE pos 12/19/2010 1129   LEUKOCYTESUR MODERATE* 06/03/2012 2020    ----------------------------------------------------------------------------------------------------------------    Imaging results:   Dg Ankle Complete Left  06/03/2012  *RADIOLOGY REPORT*  Clinical Data: Open wound on the dorsum of the forefoot.  LEFT ANKLE COMPLETE - 3+ VIEW  Comparison: None.  Findings: Osteopenia.  No acute fracture.  No dislocation. Spurring at the inferior calcaneus.  Degenerative  changes in the ankle joint.  IMPRESSION: Chronic changes.  No acute bony pathology.   Original Report Authenticated By: Jolaine Click, M.D.    Dg Foot Complete Left  06/03/2012  *RADIOLOGY REPORT*  Clinical Data: Open wound on foot  LEFT FOOT - COMPLETE 3+ VIEW  Comparison: 12/20/2009  Findings: Severe osteopenia.  Periosteal reaction along the third and fourth metatarsals is stable.  No acute fracture.  No dislocation.  Erosion at the dorsum of the navicular is stable. No definite destructive bone lesion.  IMPRESSION: No acute bony pathology.  Chronic changes.   Original Report Authenticated By: Jolaine Click, M.D.         Assessment & Plan  1. left foot ulcer with cellulitis surrounding the wound, rule out osteomyelitis by MRI of the left foot in a.m. wound care team consulted. Cultures taken, IV Vanco and Zosyn started. The wound is deep and probably unstageable. Will probably need orthopedic consult.  2. Urinary tract infection we'll culture the urine, patient on Zosyn  3. Diabetes mellitus with history of multiple wounds status post right BKA for osteomyelitis. Patient on Lantus, will place on insulin sliding scale  4. History of hypertension seems to be controlled  5. History of depression  6. Chronic renal failure; creatinine 1.47   DVT Prophylaxis Coumadin  AM Labs Ordered, also please review Full Orders  Family Communication: Admission, patients condition and plan of care including tests being ordered have been discussed with the patient and daughter who indicate understanding and agree with the plan and Code Status.  Code Status full  Disposition Plan: Discharge home with home health  Time spent in minutes : 40 minutes  Condition GUARDED

## 2012-06-03 NOTE — Progress Notes (Addendum)
ANTIBIOTIC CONSULT NOTE - INITIAL  Pharmacy Consult for vancomycin/zosyn Indication: cellulitis  No Known Allergies  Patient Measurements: Weight: 360 lb (163.295 kg) (per ER)   Vital Signs: Temp: 101.2 F (38.4 C) (04/02 1804) Temp src: Oral (04/02 1804) BP: 132/69 mmHg (04/02 1804) Pulse Rate: 114 (04/02 1804) Intake/Output from previous day:   Intake/Output from this shift:    Labs:  Recent Labs  06/03/12 1930  WBC 9.6  HGB 11.3*  PLT 206  CREATININE 1.47*   The CrCl is unknown because both a height and weight (above a minimum accepted value) are required for this calculation. No results found for this basename: VANCOTROUGH, VANCOPEAK, VANCORANDOM, GENTTROUGH, GENTPEAK, GENTRANDOM, TOBRATROUGH, TOBRAPEAK, TOBRARND, AMIKACINPEAK, AMIKACINTROU, AMIKACIN,  in the last 72 hours   Microbiology: No results found for this or any previous visit (from the past 720 hour(s)).  Medical History: Past Medical History  Diagnosis Date  . Depression   . Diabetes mellitus   . Hyperlipidemia   . Hypertension   . Chronic kidney disease   . CHF (congestive heart failure)   . Sleep apnea   . Gout   . Osteoarthritis     Medications:  Scheduled:  . [COMPLETED] acetaminophen  1,000 mg Oral Once   Infusions:    Assessment:  72 yo male with DM, wheel chair bound, right below knee amputation likely secondary to hx osteomyelitis presents to ER with swelling/wound to left calf and cellulitis to start vancomycin per pharmacy dosing  Patient's normalized CrCl = 45 ml/min/1.15m2  Goal of Therapy:  Vancomycin Trough 10-15 mcg/mL (if osteomyelitis will adjust goal to 15-20) Zosyn based on renal function   Plan:  1. 2500mg  IV vancomycin load then 2. Vancomycin 2000mg  IV q24 3. Vancomycin trough at steady state prn 4.  Zosyn 3.375g IV Q8H infused over 4hrs.   Hessie Knows, PharmD, BCPS Pager [phone] 06/03/2012 9:49 PM

## 2012-06-03 NOTE — ED Notes (Signed)
Right DP pulse found with doppler.  MD made aware.

## 2012-06-03 NOTE — ED Notes (Addendum)
Pt observed to have a right below the knee amputation and some redness on the thigh beneath it.  Wounds noted on the left heel and bottom of the right calf.  Wound has no drainage but redness and smell noted.  Pt reports having some diarrhea. Pt reports pain in legs.

## 2012-06-03 NOTE — ED Notes (Signed)
Per ems pt is from home, hx of diabetes, wheel chair bound, right below knee amputation. Left foot has small sore 1 week ago. Home health nurse came today and wound is down to tendons. Pt has vast swelling to left calf, leg was wrapped and caused wound to left calf area. ems called. Pt denies pain.

## 2012-06-04 ENCOUNTER — Inpatient Hospital Stay (HOSPITAL_COMMUNITY): Payer: Medicare Other

## 2012-06-04 ENCOUNTER — Encounter (HOSPITAL_COMMUNITY): Payer: Self-pay | Admitting: *Deleted

## 2012-06-04 DIAGNOSIS — M86179 Other acute osteomyelitis, unspecified ankle and foot: Principal | ICD-10-CM

## 2012-06-04 DIAGNOSIS — E876 Hypokalemia: Secondary | ICD-10-CM | POA: Diagnosis present

## 2012-06-04 DIAGNOSIS — E1169 Type 2 diabetes mellitus with other specified complication: Secondary | ICD-10-CM | POA: Insufficient documentation

## 2012-06-04 DIAGNOSIS — E871 Hypo-osmolality and hyponatremia: Secondary | ICD-10-CM | POA: Diagnosis present

## 2012-06-04 DIAGNOSIS — L97529 Non-pressure chronic ulcer of other part of left foot with unspecified severity: Secondary | ICD-10-CM

## 2012-06-04 DIAGNOSIS — N189 Chronic kidney disease, unspecified: Secondary | ICD-10-CM | POA: Diagnosis present

## 2012-06-04 DIAGNOSIS — N179 Acute kidney failure, unspecified: Secondary | ICD-10-CM

## 2012-06-04 HISTORY — DX: Non-pressure chronic ulcer of other part of left foot with unspecified severity: L97.529

## 2012-06-04 LAB — HEMOGLOBIN A1C: Hgb A1c MFr Bld: 11.6 % — ABNORMAL HIGH (ref <5.7)

## 2012-06-04 LAB — MRSA PCR SCREENING: MRSA by PCR: NEGATIVE

## 2012-06-04 LAB — GLUCOSE, CAPILLARY: Glucose-Capillary: 242 mg/dL — ABNORMAL HIGH (ref 70–99)

## 2012-06-04 LAB — CLOSTRIDIUM DIFFICILE BY PCR: Toxigenic C. Difficile by PCR: NEGATIVE

## 2012-06-04 LAB — PROTIME-INR: INR: 1.46 (ref 0.00–1.49)

## 2012-06-04 MED ORDER — ALLOPURINOL 100 MG PO TABS
100.0000 mg | ORAL_TABLET | Freq: Every day | ORAL | Status: DC
  Administered 2012-06-04 – 2012-06-12 (×9): 100 mg via ORAL
  Filled 2012-06-04 (×9): qty 1

## 2012-06-04 MED ORDER — HYDROCODONE-ACETAMINOPHEN 5-325 MG PO TABS
1.0000 | ORAL_TABLET | ORAL | Status: DC | PRN
  Administered 2012-06-04 – 2012-06-06 (×2): 2 via ORAL
  Administered 2012-06-06: 1 via ORAL
  Administered 2012-06-07 – 2012-06-10 (×3): 2 via ORAL
  Filled 2012-06-04 (×6): qty 2
  Filled 2012-06-04: qty 1
  Filled 2012-06-04: qty 2

## 2012-06-04 MED ORDER — PIPERACILLIN-TAZOBACTAM 3.375 G IVPB
3.3750 g | Freq: Three times a day (TID) | INTRAVENOUS | Status: AC
  Administered 2012-06-04 – 2012-06-11 (×24): 3.375 g via INTRAVENOUS
  Filled 2012-06-04 (×28): qty 50

## 2012-06-04 MED ORDER — SODIUM CHLORIDE 0.9 % IV SOLN
INTRAVENOUS | Status: DC
  Administered 2012-06-04 (×2): via INTRAVENOUS
  Filled 2012-06-04 (×2): qty 1000

## 2012-06-04 MED ORDER — WARFARIN - PHYSICIAN DOSING INPATIENT: Freq: Every day | Status: DC

## 2012-06-04 MED ORDER — SODIUM CHLORIDE 0.9 % IV SOLN: 250.0000 mL | INTRAVENOUS | Status: DC | PRN

## 2012-06-04 MED ORDER — ZOLPIDEM TARTRATE 5 MG PO TABS: 5.0000 mg | ORAL_TABLET | Freq: Every evening | ORAL | Status: DC | PRN

## 2012-06-04 MED ORDER — INSULIN GLARGINE 100 UNIT/ML ~~LOC~~ SOLN: 40.0000 [IU] | Freq: Every day | SUBCUTANEOUS | Status: DC

## 2012-06-04 MED ORDER — ACETAMINOPHEN 650 MG RE SUPP: 650.0000 mg | Freq: Four times a day (QID) | RECTAL | Status: DC | PRN

## 2012-06-04 MED ORDER — ACETAMINOPHEN 325 MG PO TABS: 650.0000 mg | ORAL_TABLET | Freq: Four times a day (QID) | ORAL | Status: DC | PRN

## 2012-06-04 MED ORDER — ROPINIROLE HCL 1 MG PO TABS
2.0000 mg | ORAL_TABLET | Freq: Every day | ORAL | Status: DC
  Administered 2012-06-04 – 2012-06-11 (×9): 2 mg via ORAL
  Filled 2012-06-04 (×10): qty 2

## 2012-06-04 MED ORDER — PANTOPRAZOLE SODIUM 40 MG PO TBEC
40.0000 mg | DELAYED_RELEASE_TABLET | Freq: Every day | ORAL | Status: DC
  Administered 2012-06-04 – 2012-06-12 (×8): 40 mg via ORAL
  Filled 2012-06-04 (×7): qty 1

## 2012-06-04 MED ORDER — SODIUM CHLORIDE 0.9 % IJ SOLN: 3.0000 mL | INTRAMUSCULAR | Status: DC | PRN

## 2012-06-04 MED ORDER — PIPERACILLIN-TAZOBACTAM 3.375 G IVPB 30 MIN
3.3750 g | Freq: Once | INTRAVENOUS | Status: AC
  Administered 2012-06-04: 3.375 g via INTRAVENOUS
  Filled 2012-06-04: qty 50

## 2012-06-04 MED ORDER — SODIUM CHLORIDE 0.9 % IJ SOLN
3.0000 mL | Freq: Two times a day (BID) | INTRAMUSCULAR | Status: DC
  Administered 2012-06-04 – 2012-06-11 (×7): 3 mL via INTRAVENOUS

## 2012-06-04 MED ORDER — POTASSIUM CHLORIDE CRYS ER 10 MEQ PO TBCR
10.0000 meq | EXTENDED_RELEASE_TABLET | Freq: Every day | ORAL | Status: DC
  Administered 2012-06-04 – 2012-06-12 (×9): 10 meq via ORAL
  Filled 2012-06-04 (×9): qty 1

## 2012-06-04 MED ORDER — GADOBENATE DIMEGLUMINE 529 MG/ML IV SOLN
20.0000 mL | Freq: Once | INTRAVENOUS | Status: AC | PRN
  Administered 2012-06-04: 20 mL via INTRAVENOUS

## 2012-06-04 MED ORDER — FENOFIBRATE 160 MG PO TABS
160.0000 mg | ORAL_TABLET | Freq: Every day | ORAL | Status: DC
  Administered 2012-06-04 – 2012-06-12 (×9): 160 mg via ORAL
  Filled 2012-06-04 (×9): qty 1

## 2012-06-04 MED ORDER — LEVOTHYROXINE SODIUM 200 MCG PO TABS
200.0000 ug | ORAL_TABLET | Freq: Every day | ORAL | Status: DC
  Administered 2012-06-04 – 2012-06-12 (×9): 200 ug via ORAL
  Filled 2012-06-04 (×10): qty 1

## 2012-06-04 MED ORDER — TAMSULOSIN HCL 0.4 MG PO CAPS
0.4000 mg | ORAL_CAPSULE | Freq: Every day | ORAL | Status: DC
  Administered 2012-06-04 – 2012-06-12 (×9): 0.4 mg via ORAL
  Filled 2012-06-04 (×9): qty 1

## 2012-06-04 MED ORDER — CHLORHEXIDINE GLUCONATE 4 % EX LIQD
60.0000 mL | Freq: Once | CUTANEOUS | Status: AC
  Administered 2012-06-05: 4 via TOPICAL
  Filled 2012-06-04: qty 60

## 2012-06-04 MED ORDER — GABAPENTIN 300 MG PO CAPS
300.0000 mg | ORAL_CAPSULE | Freq: Three times a day (TID) | ORAL | Status: DC | PRN
  Filled 2012-06-04: qty 1

## 2012-06-04 MED ORDER — FLUOXETINE HCL 20 MG PO CAPS
20.0000 mg | ORAL_CAPSULE | Freq: Two times a day (BID) | ORAL | Status: DC
  Administered 2012-06-04 – 2012-06-12 (×16): 20 mg via ORAL
  Filled 2012-06-04 (×20): qty 1

## 2012-06-04 MED ORDER — ALUM & MAG HYDROXIDE-SIMETH 200-200-20 MG/5ML PO SUSP: 30.0000 mL | Freq: Four times a day (QID) | ORAL | Status: DC | PRN

## 2012-06-04 MED ORDER — WARFARIN SODIUM 5 MG PO TABS
5.0000 mg | ORAL_TABLET | ORAL | Status: DC
  Administered 2012-06-04: 5 mg via ORAL
  Filled 2012-06-04: qty 1

## 2012-06-04 MED ORDER — DILTIAZEM HCL ER 240 MG PO CP24
240.0000 mg | ORAL_CAPSULE | Freq: Every day | ORAL | Status: DC
  Administered 2012-06-04 – 2012-06-12 (×9): 240 mg via ORAL
  Filled 2012-06-04 (×9): qty 1

## 2012-06-04 MED ORDER — INSULIN ASPART 100 UNIT/ML ~~LOC~~ SOLN
0.0000 [IU] | Freq: Three times a day (TID) | SUBCUTANEOUS | Status: DC
  Administered 2012-06-04: 15 [IU] via SUBCUTANEOUS
  Administered 2012-06-04: 8 [IU] via SUBCUTANEOUS
  Administered 2012-06-04: 5 [IU] via SUBCUTANEOUS
  Administered 2012-06-05 – 2012-06-06 (×3): 3 [IU] via SUBCUTANEOUS
  Administered 2012-06-07: 2 [IU] via SUBCUTANEOUS
  Administered 2012-06-10: 3 [IU] via SUBCUTANEOUS
  Administered 2012-06-11 (×2): 2 [IU] via SUBCUTANEOUS
  Administered 2012-06-12: 3 [IU] via SUBCUTANEOUS

## 2012-06-04 MED ORDER — INSULIN GLARGINE 100 UNIT/ML ~~LOC~~ SOLN
40.0000 [IU] | Freq: Two times a day (BID) | SUBCUTANEOUS | Status: DC
  Administered 2012-06-04 – 2012-06-07 (×8): 40 [IU] via SUBCUTANEOUS
  Filled 2012-06-04 (×11): qty 0.4

## 2012-06-04 MED ORDER — WARFARIN SODIUM 10 MG PO TABS
10.0000 mg | ORAL_TABLET | ORAL | Status: DC
  Filled 2012-06-04: qty 1

## 2012-06-04 MED ORDER — ONDANSETRON HCL 4 MG PO TABS: 4.0000 mg | ORAL_TABLET | Freq: Four times a day (QID) | ORAL | Status: DC | PRN

## 2012-06-04 MED ORDER — MORPHINE SULFATE 2 MG/ML IJ SOLN
2.0000 mg | INTRAMUSCULAR | Status: DC | PRN
  Administered 2012-06-05 – 2012-06-11 (×10): 2 mg via INTRAVENOUS
  Filled 2012-06-04 (×10): qty 1

## 2012-06-04 MED ORDER — ONDANSETRON HCL 4 MG/2ML IJ SOLN: 4.0000 mg | Freq: Four times a day (QID) | INTRAMUSCULAR | Status: DC | PRN

## 2012-06-04 MED ORDER — FUROSEMIDE 20 MG PO TABS
20.0000 mg | ORAL_TABLET | Freq: Every day | ORAL | Status: DC
  Administered 2012-06-04: 20 mg via ORAL
  Filled 2012-06-04 (×2): qty 1

## 2012-06-04 MED ORDER — ATORVASTATIN CALCIUM 10 MG PO TABS
10.0000 mg | ORAL_TABLET | Freq: Every day | ORAL | Status: DC
  Administered 2012-06-04 – 2012-06-11 (×8): 10 mg via ORAL
  Filled 2012-06-04 (×9): qty 1

## 2012-06-04 NOTE — Consult Note (Addendum)
WOC consult Note Consult requested for left foot full thickness wound. MRI from 4/3 AM: highly suspicious for TENOSYNOVITIS AND ABSCESS and POTENTIAL EARLY OSTEOMYLITIS. This is beyound WOC scope of practice. PLEASE CONSULT ORTHO SERVICE FOR FURTHER ASSESSMENT AND PLAN OF CARE. Discussed plan of care with Dr Izola Price. Please re-consult if further assistance is needed.  Thank-you,  Cammie Mcgee MSN, RN, CWOCN, [place], CNS [phone]

## 2012-06-04 NOTE — Progress Notes (Signed)
Patient ID: [name], male   DOB: 30-Jul-1940, 72 y.o.   MRN: [id number]  [facility] PROGRESS NOTE  [name] HYQ:[id number] DOB: 1940-05-30 DOA: 06/03/2012 PCP: Loreen Freud, DO  Brief narrative: Pt is 72 y.o. 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 Pt denied fevers and chills on admission  Principal Problem:   Acute osteomyelitis, ankle and foot - in pt with uncontrolled diabetes and right BKA - will ask ortho for further recommendations  - continue broad spectrum ABX for now Active Problems:   DM (diabetes mellitus) type II controlled with renal manifestation - A1C > 11, will continue home medical regimen and will add SSI - consult to diabetic educator    Hyponatremia - secondary to pre renal etiology and possible Lasix that pt takes at home - hold Lasix and continue IVF for now, BMP in AM   Acute on chronic renal failure - continue IVF and check BMP in AM - as noted above, will hold Lasix - strict I's and O's   ANEMIA, PERNICIOUS - Hg and Hct stable and at pt's baseline - CBC in AM   HYPERTENSION - reasonable inpatient control   BKA, RIGHT LEG - appears stable, Dr. Lajoyce Corners has performed surgery in 2009   Atrial fibrillation - continue Diltiazem and Coumadin   Hypokalemia - will supplement orally, BMP in AM  Consultants:  Ortho   Procedures/Studies: Dg Ankle Complete Left 06/03/2012   Chronic changes.  No acute bony pathology.     Mr Ankle Left W Wo Contrast 06/04/2012   1.  Open wound anteriorly at the ankle with ill-defined peripheral enhancing fluid collection a/w the extensor tendons highly suspicious for infectious  tenosynovitis and abscess formation.  2.  Adjacent marrow edema and enhancement within the medial and middle cuneiforms is nonspecific, although potentially secondary to early osteomyelitis.  3.  Suspected degenerative subchondral cyst formation anteriorly at the tibiotalar joint.  No specific signs of ankle joint infection.  4.  Chronic Achilles tendinosis and partial tearing, partial longitudinal split tear of the peroneus brevis and chronic thickening of the plantar fascia.     Dg Foot Complete Left 06/03/2012   No acute bony pathology.  Chronic changes.    Antibiotics:  Vancomycin 04/02 -->  Zosyn 04/02 -->  Code Status: Full Family Communication: Pt at bedside Disposition Plan: Home when medically stable  HPI/Subjective: No events overnight.   Objective: Filed Vitals:   06/04/12 0034 06/04/12 0042 06/04/12 0538 06/04/12 1331  BP:  111/63 122/76 130/54  Pulse:  110 71 92  Temp:  97.6 F (36.4 C) 98.5 F (36.9 C) 97.6 F (36.4 C)  TempSrc:  Oral Oral Oral  Resp:  18 18 19   Height: 6' (1.829 m)     Weight: 141.114 kg (311 lb 1.6 oz)     SpO2:  93% 96% 96%    Intake/Output Summary (Last 24 hours) at 06/04/12 1432 Last data filed at 06/04/12 1331  Gross per 24 hour  Intake    730 ml  Output   2026 ml  Net  -1296 ml    Exam:   General:  Pt is alert, follows  commands appropriately, not in acute distress  Cardiovascular: Irregular rhythm, tachycardic, no murmurs, no rubs, no gallops  Respiratory: Clear to auscultation bilaterally, no wheezing, no crackles, no rhonchi  Abdomen: Soft, non tender, non distended, bowel sounds present, no guarding  Extremities: Right BKA, left foot open wound, draining pus, surrounding erythema   Neuro: Grossly nonfocal  Data Reviewed: Basic Metabolic Panel:  Recent Labs Lab 06/03/12 1930  NA 130*  K 3.4*  CL 93*  CO2 25  GLUCOSE 357*  BUN 31*  CREATININE 1.47*  CALCIUM 9.2   Liver Function Tests:  Recent Labs Lab  06/03/12 1930  AST 12  ALT 11  ALKPHOS 52  BILITOT 0.5  PROT 7.3  ALBUMIN 2.9*   CBC:  Recent Labs Lab 06/03/12 1930  WBC 9.6  NEUTROABS 7.9*  HGB 11.3*  HCT 33.0*  MCV 87.1  PLT 206   CBG:  Recent Labs Lab 06/04/12 0816 06/04/12 1150  GLUCAP 356* 242*   Recent Results (from the past 240 hour(s))  CULTURE, BLOOD (ROUTINE X 2)     Status: None   Collection Time    06/03/12  7:30 PM      Result Value Range Status   Specimen Description BLOOD RIGHT ARM   Final   Special Requests BOTTLES DRAWN AEROBIC AND ANAEROBIC   Final   Culture  Setup Time 06/03/2012 23:27   Final   Culture     Final   Value:        BLOOD CULTURE RECEIVED NO GROWTH TO DATE CULTURE WILL BE HELD FOR 5 DAYS BEFORE ISSUING A FINAL NEGATIVE REPORT   Report Status PENDING   Incomplete  CULTURE, BLOOD (ROUTINE X 2)     Status: None   Collection Time    06/03/12  7:35 PM      Result Value Range Status   Specimen Description BLOOD RIGHT ARM   Final   Special Requests BOTTLES DRAWN AEROBIC AND ANAEROBIC   Final   Culture  Setup Time 06/03/2012 23:24   Final   Culture     Final   Value:        BLOOD CULTURE RECEIVED NO GROWTH TO DATE CULTURE WILL BE HELD FOR 5 DAYS BEFORE ISSUING A FINAL NEGATIVE REPORT   Report Status PENDING   Incomplete  MRSA PCR SCREENING     Status: None   Collection Time    06/04/12  2:25 AM      Result Value Range Status   MRSA by PCR NEGATIVE  NEGATIVE Final   Comment:            The GeneXpert MRSA Assay (FDA     approved for NASAL specimens     only), is one component of a     comprehensive MRSA colonization     surveillance program. It is not     intended to diagnose MRSA     infection nor to guide or     monitor treatment for     MRSA infections.  CLOSTRIDIUM DIFFICILE BY PCR     Status: None   Collection Time    06/04/12  6:02 AM      Result Value Range Status   C difficile by pcr NEGATIVE  NEGATIVE Final     Scheduled Meds: . allopurinol  100 mg Oral  Daily  . atorvastatin  10 mg Oral q1800  . diltiazem  240 mg Oral Daily  . fenofibrate  160 mg Oral Daily  .  FLUoxetine  20 mg Oral BID  . furosemide  20 mg Oral Daily  . insulin aspart  0-15 Units Subcutaneous TID WC  . insulin glargine  40 Units Subcutaneous BID  . levothyroxine  200 mcg Oral QAC breakfast  . pantoprazole  40 mg Oral Daily  . piperacillin-tazobactam (ZOSYN)  IV  3.375 g Intravenous Q8H  . potassium chloride  10 mEq Oral Daily  . rOPINIRole  2 mg Oral QHS  . sodium chloride  3 mL Intravenous Q12H  . tamsulosin  0.4 mg Oral Daily  . vancomycin  2,000 mg Intravenous Q24H  . [START ON 06/05/2012] warfarin  10 mg Oral Custom  . warfarin  5 mg Oral Custom  . Warfarin - Physician Dosing Inpatient   Does not apply q1800   Continuous Infusions: . sodium chloride 75 mL/hr at 06/04/12 0030     Debbora Presto, MD  [facility] Pager [phone]  If 7PM-7AM, please contact night-coverage [email or web address] Password [email or web address] 06/04/2012, 2:32 PM   LOS: 1 day

## 2012-06-04 NOTE — Progress Notes (Signed)
Pt is being transfered to [facility] . Rn called to be given report. Will call back to get report. [facility] notified about pt transfer.

## 2012-06-04 NOTE — Consult Note (Signed)
Reason for Consult: Abscess ulceration left ankle Referring Physician: Dr. Kenney [name] is an 72 y.o. male.  HPI: Patient is a 72 year old gentleman well-known to me who is status post amputation of the right lower extremity who is being currently treated at home with home health nursing for dressing changes for ulceration abscess dorsum of the left ankle. Patient currently ambulates in a motorized wheelchair.  Past Medical History  Diagnosis Date  . Depression   . Diabetes mellitus   . Hyperlipidemia   . Hypertension   . Chronic kidney disease   . CHF (congestive heart failure)   . Sleep apnea   . Gout   . Osteoarthritis     Past Surgical History  Procedure Laterality Date  . Leg amputation below knee    . Hernia repair    . Orchiectomy    . Rotator cuff repair      Family History  Problem Relation Age of Onset  . Diabetes Brother   . Heart disease Mother   . Kidney disease Mother     Social History:  reports that he has never smoked. He has never used smokeless tobacco. He reports that he does not drink alcohol or use illicit drugs.  Allergies: No Known Allergies  Medications: I have reviewed the patient's current medications.  Results for orders placed during the hospital encounter of 06/03/12 (from the past 48 hour(s))  CBC WITH DIFFERENTIAL     Status: Abnormal   Collection Time    06/03/12  7:30 PM      Result Value Range   WBC 9.6  4.0 - 10.5 K/uL   RBC 3.79 (*) 4.22 - 5.81 MIL/uL   Hemoglobin 11.3 (*) 13.0 - 17.0 g/dL   HCT 81.1 (*) 91.4 - 78.2 %   MCV 87.1  78.0 - 100.0 fL   MCH 29.8  26.0 - 34.0 pg   MCHC 34.2  30.0 - 36.0 g/dL   RDW 95.6  21.3 - 08.6 %   Platelets 206  150 - 400 K/uL   Neutrophils Relative 83 (*) 43 - 77 %   Neutro Abs 7.9 (*) 1.7 - 7.7 K/uL   Lymphocytes Relative 9 (*) 12 - 46 %   Lymphs Abs 0.9  0.7 - 4.0 K/uL   Monocytes Relative 6  3 - 12 %   Monocytes Absolute 0.6  0.1 - 1.0 K/uL   Eosinophils Relative 2  0 - 5 %    Eosinophils Absolute 0.2  0.0 - 0.7 K/uL   Basophils Relative 0  0 - 1 %   Basophils Absolute 0.0  0.0 - 0.1 K/uL  COMPREHENSIVE METABOLIC PANEL     Status: Abnormal   Collection Time    06/03/12  7:30 PM      Result Value Range   Sodium 130 (*) 135 - 145 mEq/L   Potassium 3.4 (*) 3.5 - 5.1 mEq/L   Chloride 93 (*) 96 - 112 mEq/L   CO2 25  19 - 32 mEq/L   Glucose, Bld 357 (*) 70 - 99 mg/dL   BUN 31 (*) 6 - 23 mg/dL   Creatinine, Ser 5.78 (*) 0.50 - 1.35 mg/dL   Calcium 9.2  8.4 - 46.9 mg/dL   Total Protein 7.3  6.0 - 8.3 g/dL   Albumin 2.9 (*) 3.5 - 5.2 g/dL   AST 12  0 - 37 U/L   ALT 11  0 - 53 U/L   Alkaline Phosphatase 52  39 - 117 U/L   Total Bilirubin 0.5  0.3 - 1.2 mg/dL   GFR calc non Af Amer 46 (*) >90 mL/min   GFR calc Af Amer 53 (*) >90 mL/min   Comment:            The eGFR has been calculated     using the CKD EPI equation.     This calculation has not been     validated in all clinical     situations.     eGFR's persistently     <90 mL/min signify     possible Chronic Kidney Disease.  LACTIC ACID, PLASMA     Status: None   Collection Time    06/03/12  7:30 PM      Result Value Range   Lactic Acid, Venous 1.2  0.5 - 2.2 mmol/L  CULTURE, BLOOD (ROUTINE X 2)     Status: None   Collection Time    06/03/12  7:30 PM      Result Value Range   Specimen Description BLOOD RIGHT ARM     Special Requests BOTTLES DRAWN AEROBIC AND ANAEROBIC     Culture  Setup Time 06/03/2012 23:27     Culture       Value:        BLOOD CULTURE RECEIVED NO GROWTH TO DATE CULTURE WILL BE HELD FOR 5 DAYS BEFORE ISSUING A FINAL NEGATIVE REPORT   Report Status PENDING    CULTURE, BLOOD (ROUTINE X 2)     Status: None   Collection Time    06/03/12  7:35 PM      Result Value Range   Specimen Description BLOOD RIGHT ARM     Special Requests BOTTLES DRAWN AEROBIC AND ANAEROBIC     Culture  Setup Time 06/03/2012 23:24     Culture       Value:        BLOOD CULTURE RECEIVED NO GROWTH  TO DATE CULTURE WILL BE HELD FOR 5 DAYS BEFORE ISSUING A FINAL NEGATIVE REPORT   Report Status PENDING    URINALYSIS, ROUTINE W REFLEX MICROSCOPIC     Status: Abnormal   Collection Time    06/03/12  8:20 PM      Result Value Range   Color, Urine YELLOW  YELLOW   APPearance CLOUDY (*) CLEAR   Specific Gravity, Urine 1.024  1.005 - 1.030   pH 5.0  5.0 - 8.0   Glucose, UA >1000 (*) NEGATIVE mg/dL   Hgb urine dipstick SMALL (*) NEGATIVE   Bilirubin Urine NEGATIVE  NEGATIVE   Ketones, ur NEGATIVE  NEGATIVE mg/dL   Protein, ur NEGATIVE  NEGATIVE mg/dL   Urobilinogen, UA 1.0  0.0 - 1.0 mg/dL   Nitrite POSITIVE (*) NEGATIVE   Leukocytes, UA MODERATE (*) NEGATIVE  URINE MICROSCOPIC-ADD ON     Status: Abnormal   Collection Time    06/03/12  8:20 PM      Result Value Range   WBC, UA 21-50  <3 WBC/hpf   Bacteria, UA MANY (*) RARE   Urine-Other FEW YEAST    HEMOGLOBIN A1C     Status: Abnormal   Collection Time    06/04/12  1:30 AM      Result Value Range   Hemoglobin A1C 11.6 (*) <5.7 %   Comment: (NOTE)  According to the ADA Clinical Practice Recommendations for 2011, when     HbA1c is used as a screening test:      >=6.5%   Diagnostic of Diabetes Mellitus               (if abnormal result is confirmed)     5.7-6.4%   Increased risk of developing Diabetes Mellitus     References:Diagnosis and Classification of Diabetes Mellitus,Diabetes     Care,2011,34(Suppl 1):S62-S69 and Standards of Medical Care in             Diabetes - 2011,Diabetes Care,2011,34 (Suppl 1):S11-S61.   Mean Plasma Glucose 286 (*) <117 mg/dL  PROTIME-INR     Status: Abnormal   Collection Time    06/04/12  1:30 AM      Result Value Range   Prothrombin Time 17.3 (*) 11.6 - 15.2 seconds   INR 1.46  0.00 - 1.49  MRSA PCR SCREENING     Status: None   Collection Time    06/04/12  2:25 AM      Result Value Range   MRSA by PCR NEGATIVE   NEGATIVE   Comment:            The GeneXpert MRSA Assay (FDA     approved for NASAL specimens     only), is one component of a     comprehensive MRSA colonization     surveillance program. It is not     intended to diagnose MRSA     infection nor to guide or     monitor treatment for     MRSA infections.  CLOSTRIDIUM DIFFICILE BY PCR     Status: None   Collection Time    06/04/12  6:02 AM      Result Value Range   C difficile by pcr NEGATIVE  NEGATIVE  OCCULT BLOOD X 1 CARD TO LAB, STOOL     Status: None   Collection Time    06/04/12  6:02 AM      Result Value Range   Fecal Occult Bld NEGATIVE  NEGATIVE  GLUCOSE, CAPILLARY     Status: Abnormal   Collection Time    06/04/12  8:16 AM      Result Value Range   Glucose-Capillary 356 (*) 70 - 99 mg/dL  GLUCOSE, CAPILLARY     Status: Abnormal   Collection Time    06/04/12 11:50 AM      Result Value Range   Glucose-Capillary 242 (*) 70 - 99 mg/dL  GLUCOSE, CAPILLARY     Status: Abnormal   Collection Time    06/04/12  4:10 PM      Result Value Range   Glucose-Capillary 261 (*) 70 - 99 mg/dL    Dg Ankle Complete Left  06/03/2012  *RADIOLOGY REPORT*  Clinical Data: Open wound on the dorsum of the forefoot.  LEFT ANKLE COMPLETE - 3+ VIEW  Comparison: None.  Findings: Osteopenia.  No acute fracture.  No dislocation. Spurring at the inferior calcaneus.  Degenerative changes in the ankle joint.  IMPRESSION: Chronic changes.  No acute bony pathology.   Original Report Authenticated By: Jolaine Click, M.D.    Mr Ankle Left W Wo Contrast  06/04/2012   * RADIOLOGY REPORT *  Clinical Data:  Draining anterior ankle pain with pain and swelling for 2 weeks.  Evaluate for osteomyelitis.  MRI OF THE LEFT ANKLE WITHOUT AND WITH CONTRAST  Technique: Multiplanar, multisequence MR imaging of the  left  ankle was performed before and after the administration of intravenous contrast.  Comparison:  Radiographs 06/03/2012 and left forefoot MRI 12/28/2009.   Contrast: 20mL MULTIHANCE GADOBENATE DIMEGLUMINE 529 MG/ML IV SOLN  FINDINGS: There is an irregular open wound anteriorly at the ankle associated with ill-defined fluid, soft tissue edema and enhancement around the extensor tendons.  There is an ill-defined approximately 3.7 x 1.5 cm peripherally enhancing fluid collection associated with the tibialis anterior tendon, best seen on axial postcontrast image number 13. The foot musculature is diffusely atrophied.  TENDONS Peroneal: There is a longitudinal split tear of the peroneus brevis tendon without discontinuity or associated peroneal sheath fluid. Posteromedial:  Intact. Anterior: As above, there is concern of infectious tenosynovitis associated with the extensor tendons, especially the tibialis anterior.  No tendon disruption is identified. 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  The distal tendon is attenuated, but intact. There is some calcification within the tendon.  No surrounding inflammatory change or fluid collection is seen. Plantar Fascia: There is chronic thickening of the medial cord of the plantar fascia associated with a moderate sized plantar calcaneal spur.  No acute abnormality identified.  LIGAMENTS Lateral: Intact. Medial: Intact.  CARTILAGE Ankle Joint: There is no significant ankle joint effusion, although there is mild synovial enhancement following contrast.  There is nonspecific subchondral cyst formation anteriorly in the talar dome and tibial plafond, likely degenerative.  There is no cortical destruction. Subtalar Joints/Sinus Tarsi: The subtalar joint and tarsal sinus appear normal. Bones: There is ill-defined marrow edema and enhancement within the medial and middle cuneiforms.  No cortical destruction is identified on the T1-weighted images.  This is immediately adjacent to the suspected infectious tenosynovitis involving the extensor tendons and could be related to hyperemia, although early  osteomyelitis cannot be excluded.  There is mild nonspecific edema and enhancement within the navicular.  No other significant osseous abnormalities are identified.  IMPRESSION:  1.  Open wound anteriorly at the ankle with ill-defined peripheral enhancing fluid collection associated with the extensor tendons highly suspicious for infectious tenosynovitis and abscess formation. 2.  Adjacent marrow edema and enhancement within the medial and middle cuneiforms is nonspecific, although potentially secondary to early osteomyelitis. 3.  Suspected degenerative subchondral cyst formation anteriorly at the tibiotalar joint.  No specific signs of ankle joint infection. 4.  Chronic Achilles tendinosis and partial tearing, partial longitudinal split tear of the peroneus brevis and chronic thickening of the plantar fascia.   Original Report Authenticated By: Carey Bullocks, M.D.    Dg Foot Complete Left  06/03/2012  *RADIOLOGY REPORT*  Clinical Data: Open wound on foot  LEFT FOOT - COMPLETE 3+ VIEW  Comparison: 12/20/2009  Findings: Severe osteopenia.  Periosteal reaction along the third and fourth metatarsals is stable.  No acute fracture.  No dislocation.  Erosion at the dorsum of the navicular is stable. No definite destructive bone lesion.  IMPRESSION: No acute bony pathology.  Chronic changes.   Original Report Authenticated By: Jolaine Click, M.D.     Review of Systems  All other systems reviewed and are negative.   Blood pressure 130/54, pulse 92, temperature 97.6 F (36.4 C), temperature source Oral, resp. rate 19, height 6' (1.829 m), weight 141.114 kg (311 lb 1.6 oz), SpO2 96.00%. Physical Exam On examination patient has a massive a 6 cm in diameter ulcer over the dorsum the ankle. There is purulent drainage there is exposed tendons with recurrent necrotic tendons. The joint is not exposed. MRI  scan of the foot and ankle is reviewed as well as radiographs and shows no definitive  osteomyelitis. Assessment/Plan: Assessment abscess ulceration dorsum of the left ankle with the amputation of the right lower extremity currently ambulates with a motorized wheelchair. Plan discussed with the patient and his daughter who has power of attorney that for limb salvage surgery we would need to proceed with surgery for debridement excision of necrotic tissue excision necrotic tendons and placement of a wound VAC. Discussed the [place] loss or risk of limb but will try limb salvage surgery first risks and benefits were discussed including persistent infection nonhealing wound potential for a transtibial amputation. Patient and his daughter state to understand and wish to proceed with surgery this time.  Will plan to have patient transferred to [facility].  I am and surgery all day tomorrow at [facility], we can add on his surgery tomorrow. His daughter who is the medical power of attorney will need to sign his permit.  Ettel Albergo V 06/04/2012, 5:33 PM

## 2012-06-04 NOTE — Care Management (Signed)
CARE MANAGEMENT NOTE 06/04/2012  Patient:  [name], [name]   Account Number:  [id number]  Date Initiated:  06/04/2012  Documentation initiated by:  Kayleann Mccaffery  Subjective/Objective Assessment:   72 yo male admitted with ulcer cellulitis. PTA pt from home with [facility] services.     Action/Plan:   Home when stable   Anticipated DC Date:     Anticipated DC Plan:  HOME W HOME HEALTH SERVICES         Choice offered to / List presented to:  NA           Status of service:  In process, will continue to follow Medicare Important Message given?   (If response is "NO", the following Medicare IM given date fields will be blank) Date Medicare IM given:   Date Additional Medicare IM given:    Discharge Disposition:    Per UR Regulation:  Reviewed for med. necessity/level of care/duration of stay  If discussed at Long Length of Stay Meetings, dates discussed:    Comments:  06/04/12 1140 Rodnisha Blomgren,RN,BSN [phone]

## 2012-06-04 NOTE — Progress Notes (Signed)
Pt transferred by [facility] to [id number].

## 2012-06-04 NOTE — Progress Notes (Signed)
Inpatient Diabetes Program Recommendations  AACE/ADA: New Consensus Statement on Inpatient Glycemic Control (2013)  Target Ranges:  Prepandial:   less than 140 mg/dL      Peak postprandial:   less than 180 mg/dL (1-2 hours)      Critically ill patients:  140 - 180 mg/dL   Reason for Visit: Diabetes Uncontrolled  "My blood sugar has been high since my wife died last year."  States he checks blood sugars about every other day and uses insulin pen to inject Lantus and Humalog.  Has been to diabetes classes in the past.  PCP is at [facility].    Results for NIMAI, BURBACH (MRN [id number]) as of 06/04/2012 16:16  Ref. Range 06/04/2012 08:16 06/04/2012 11:50  Glucose-Capillary Latest Range: 70-99 mg/dL 409 (H) 811 (H)  Results for CORI, HENNINGSEN (MRN [id number]) as of 06/04/2012 16:16  Ref. Range 06/04/2012 01:30  Hemoglobin A1C Latest Range: <5.7 % 11.6 (H)  Results for YITZCHOK, CARRIGER (MRN [id number]) as of 06/04/2012 16:16  Ref. Range 06/03/2012 19:30  Sodium Latest Range: 135-145 mEq/L 130 (L)  Potassium Latest Range: 3.5-5.1 mEq/L 3.4 (L)  Chloride Latest Range: 96-112 mEq/L 93 (L)  CO2 Latest Range: 19-32 mEq/L 25  BUN Latest Range: 6-23 mg/dL 31 (H)  Creatinine Latest Range: 0.50-1.35 mg/dL 4.69 (H)  Calcium Latest Range: 8.4-10.5 mg/dL 9.2  GFR calc non Af Amer Latest Range: >90 mL/min 46 (L)  GFR calc Af Amer Latest Range: >90 mL/min 53 (L)  Glucose Latest Range: 70-99 mg/dL 629 (H)    Inpatient Diabetes Program Recommendations Insulin - Meal Coverage: Add meal coverage insulin - Novolog 8 units tidwc Titrate up Lantus until FBS < 180 mg/dL  Uncontrolled DM with BMWU1L of 11.6%.  Note:  Recommend IV Insulin/GlucoStabilizer if CBGs continue >250 mg/dL. Will follow closely.  Thank you. Ailene Ards, RD, LDN, CDE Inpatient Diabetes Coordinator [phone]

## 2012-06-05 ENCOUNTER — Encounter (HOSPITAL_COMMUNITY): Payer: Self-pay | Admitting: Anesthesiology

## 2012-06-05 ENCOUNTER — Inpatient Hospital Stay (HOSPITAL_COMMUNITY): Payer: Medicare Other | Admitting: Anesthesiology

## 2012-06-05 ENCOUNTER — Encounter (HOSPITAL_COMMUNITY)
Admission: EM | Disposition: A | Discharge status: Home / Self Care | Payer: Self-pay | Source: Home / Self Care | Attending: Internal Medicine

## 2012-06-05 DIAGNOSIS — N058 Unspecified nephritic syndrome with other morphologic changes: Secondary | ICD-10-CM

## 2012-06-05 DIAGNOSIS — E1129 Type 2 diabetes mellitus with other diabetic kidney complication: Secondary | ICD-10-CM

## 2012-06-05 HISTORY — PX: APPLICATION OF WOUND VAC: SHX5189

## 2012-06-05 HISTORY — PX: I & D EXTREMITY: SHX5045

## 2012-06-05 LAB — BASIC METABOLIC PANEL
BUN: 18 mg/dL (ref 6–23)
Chloride: 104 mEq/L (ref 96–112)
Creatinine, Ser: 1.15 mg/dL (ref 0.50–1.35)
GFR calc Af Amer: 71 mL/min — ABNORMAL LOW (ref >90)
GFR calc non Af Amer: 61 mL/min — ABNORMAL LOW (ref >90)

## 2012-06-05 LAB — CBC
HCT: 34.5 % — ABNORMAL LOW (ref 39.0–52.0)
MCHC: 33.6 g/dL (ref 30.0–36.0)
MCV: 85.4 fL (ref 78.0–100.0)
RDW: 14.7 % (ref 11.5–15.5)

## 2012-06-05 LAB — GLUCOSE, CAPILLARY
Glucose-Capillary: 164 mg/dL — ABNORMAL HIGH (ref 70–99)
Glucose-Capillary: 70 mg/dL (ref 70–99)

## 2012-06-05 SURGERY — IRRIGATION AND DEBRIDEMENT EXTREMITY
Anesthesia: General | Site: Ankle | Laterality: Left | Wound class: Dirty or Infected

## 2012-06-05 MED ORDER — PROMETHAZINE HCL 25 MG/ML IJ SOLN: 6.2500 mg | INTRAMUSCULAR | Status: DC | PRN

## 2012-06-05 MED ORDER — OXYCODONE HCL 5 MG PO TABS: 5.0000 mg | ORAL_TABLET | Freq: Once | ORAL | Status: AC | PRN

## 2012-06-05 MED ORDER — ONDANSETRON HCL 4 MG/2ML IJ SOLN: 4.0000 mg | Freq: Four times a day (QID) | INTRAMUSCULAR | Status: DC | PRN

## 2012-06-05 MED ORDER — HYDROMORPHONE HCL PF 1 MG/ML IJ SOLN
0.2500 mg | INTRAMUSCULAR | Status: DC | PRN
  Administered 2012-06-05 – 2012-06-09 (×4): 0.5 mg via INTRAVENOUS
  Filled 2012-06-05 (×2): qty 1

## 2012-06-05 MED ORDER — PROPOFOL 10 MG/ML IV BOLUS
INTRAVENOUS | Status: DC | PRN
  Administered 2012-06-05: 120 mg via INTRAVENOUS

## 2012-06-05 MED ORDER — LIDOCAINE HCL (PF) 1 % IJ SOLN: 5.0000 mL | Freq: Once | INTRAMUSCULAR | Status: DC

## 2012-06-05 MED ORDER — DIPHENHYDRAMINE-ZINC ACETATE 2-0.1 % EX CREA
TOPICAL_CREAM | Freq: Two times a day (BID) | CUTANEOUS | Status: DC | PRN
  Administered 2012-06-05 – 2012-06-06 (×2): via TOPICAL
  Filled 2012-06-05: qty 28

## 2012-06-05 MED ORDER — METOCLOPRAMIDE HCL 10 MG PO TABS: 5.0000 mg | ORAL_TABLET | Freq: Three times a day (TID) | ORAL | Status: DC | PRN

## 2012-06-05 MED ORDER — WARFARIN SODIUM 10 MG PO TABS
10.0000 mg | ORAL_TABLET | Freq: Once | ORAL | Status: AC
  Administered 2012-06-05: 10 mg via ORAL
  Filled 2012-06-05: qty 1

## 2012-06-05 MED ORDER — OXYCODONE HCL 5 MG/5ML PO SOLN: 5.0000 mg | Freq: Once | ORAL | Status: AC | PRN

## 2012-06-05 MED ORDER — POTASSIUM CHLORIDE IN NACL 20-0.9 MEQ/L-% IV SOLN
INTRAVENOUS | Status: DC
  Administered 2012-06-05: 18:00:00 via INTRAVENOUS
  Filled 2012-06-05 (×3): qty 1000

## 2012-06-05 MED ORDER — SODIUM CHLORIDE 0.9 % IR SOLN
Status: DC | PRN
  Administered 2012-06-05: 3000 mL via INTRAVESICAL

## 2012-06-05 MED ORDER — LACTATED RINGERS IV SOLN
INTRAVENOUS | Status: DC | PRN
  Administered 2012-06-05: 09:00:00 via INTRAVENOUS

## 2012-06-05 MED ORDER — FENTANYL CITRATE 0.05 MG/ML IJ SOLN
INTRAMUSCULAR | Status: DC | PRN
  Administered 2012-06-05: 100 ug via INTRAVENOUS

## 2012-06-05 MED ORDER — LIDOCAINE HCL (CARDIAC) 20 MG/ML IV SOLN
INTRAVENOUS | Status: DC | PRN
  Administered 2012-06-05: 100 mg via INTRAVENOUS

## 2012-06-05 MED ORDER — BUPIVACAINE HCL (PF) 0.5 % IJ SOLN
5.0000 mL | Freq: Once | INTRAMUSCULAR | Status: DC
  Filled 2012-06-05: qty 10

## 2012-06-05 MED ORDER — ONDANSETRON HCL 4 MG PO TABS: 4.0000 mg | ORAL_TABLET | Freq: Four times a day (QID) | ORAL | Status: DC | PRN

## 2012-06-05 MED ORDER — LACTATED RINGERS IV SOLN
INTRAVENOUS | Status: DC
  Administered 2012-06-05 – 2012-06-09 (×2): via INTRAVENOUS

## 2012-06-05 MED ORDER — METOCLOPRAMIDE HCL 5 MG/ML IJ SOLN: 5.0000 mg | Freq: Three times a day (TID) | INTRAMUSCULAR | Status: DC | PRN

## 2012-06-05 SURGICAL SUPPLY — 47 items
BLADE SURG 10 STRL SS (BLADE) ×2 IMPLANT
BNDG COHESIVE 4X5 TAN STRL (GAUZE/BANDAGES/DRESSINGS) ×1 IMPLANT
BNDG COHESIVE 6X5 TAN STRL LF (GAUZE/BANDAGES/DRESSINGS) ×2 IMPLANT
BNDG GAUZE STRTCH 6 (GAUZE/BANDAGES/DRESSINGS) ×3 IMPLANT
CLOTH BEACON ORANGE TIMEOUT ST (SAFETY) ×2 IMPLANT
COTTON STERILE ROLL (GAUZE/BANDAGES/DRESSINGS) ×1 IMPLANT
COVER SURGICAL LIGHT HANDLE (MISCELLANEOUS) ×2 IMPLANT
CUFF TOURNIQUET SINGLE 18IN (TOURNIQUET CUFF) ×2 IMPLANT
CUFF TOURNIQUET SINGLE 24IN (TOURNIQUET CUFF) IMPLANT
CUFF TOURNIQUET SINGLE 34IN LL (TOURNIQUET CUFF) IMPLANT
CUFF TOURNIQUET SINGLE 44IN (TOURNIQUET CUFF) IMPLANT
DRAPE INCISE IOBAN 66X45 STRL (DRAPES) ×1 IMPLANT
DRAPE U-SHAPE 47X51 STRL (DRAPES) ×2 IMPLANT
DRSG ADAPTIC 3X8 NADH LF (GAUZE/BANDAGES/DRESSINGS) ×2 IMPLANT
DRSG VAC ATS SM SENSATRAC (GAUZE/BANDAGES/DRESSINGS) ×1 IMPLANT
DURAPREP 26ML APPLICATOR (WOUND CARE) ×2 IMPLANT
ELECT CAUTERY BLADE 6.4 (BLADE) IMPLANT
ELECT REM PT RETURN 9FT ADLT (ELECTROSURGICAL)
ELECTRODE REM PT RTRN 9FT ADLT (ELECTROSURGICAL) IMPLANT
GLOVE BIO SURGEON STRL SZ8 (GLOVE) ×1 IMPLANT
GLOVE BIOGEL PI IND STRL 8 (GLOVE) IMPLANT
GLOVE BIOGEL PI IND STRL 9 (GLOVE) ×1 IMPLANT
GLOVE BIOGEL PI INDICATOR 8 (GLOVE) ×1
GLOVE BIOGEL PI INDICATOR 9 (GLOVE) ×1
GLOVE SURG ORTHO 9.0 STRL STRW (GLOVE) ×2 IMPLANT
GOWN PREVENTION PLUS XLARGE (GOWN DISPOSABLE) ×3 IMPLANT
GOWN SRG XL XLNG 56XLVL 4 (GOWN DISPOSABLE) ×1 IMPLANT
GOWN STRL NON-REIN XL XLG LVL4 (GOWN DISPOSABLE) ×2
HANDPIECE INTERPULSE COAX TIP (DISPOSABLE) ×2
KIT BASIN OR (CUSTOM PROCEDURE TRAY) ×2 IMPLANT
KIT ROOM TURNOVER OR (KITS) ×2 IMPLANT
MANIFOLD NEPTUNE II (INSTRUMENTS) ×2 IMPLANT
NS IRRIG 1000ML POUR BTL (IV SOLUTION) ×2 IMPLANT
PACK ORTHO EXTREMITY (CUSTOM PROCEDURE TRAY) ×2 IMPLANT
PAD ARMBOARD 7.5X6 YLW CONV (MISCELLANEOUS) ×4 IMPLANT
PADDING CAST COTTON 6X4 STRL (CAST SUPPLIES) ×2 IMPLANT
SET HNDPC FAN SPRY TIP SCT (DISPOSABLE) IMPLANT
SPONGE GAUZE 4X4 12PLY (GAUZE/BANDAGES/DRESSINGS) ×1 IMPLANT
SPONGE LAP 18X18 X RAY DECT (DISPOSABLE) ×2 IMPLANT
STOCKINETTE IMPERVIOUS 9X36 MD (GAUZE/BANDAGES/DRESSINGS) ×1 IMPLANT
TOWEL OR 17X24 6PK STRL BLUE (TOWEL DISPOSABLE) ×1 IMPLANT
TOWEL OR 17X26 10 PK STRL BLUE (TOWEL DISPOSABLE) ×2 IMPLANT
TUBE ANAEROBIC SPECIMEN COL (MISCELLANEOUS) IMPLANT
TUBE CONNECTING 12X1/4 (SUCTIONS) ×2 IMPLANT
UNDERPAD 30X30 INCONTINENT (UNDERPADS AND DIAPERS) ×1 IMPLANT
WATER STERILE IRR 1000ML POUR (IV SOLUTION) ×2 IMPLANT
YANKAUER SUCT BULB TIP NO VENT (SUCTIONS) ×2 IMPLANT

## 2012-06-05 NOTE — Anesthesia Postprocedure Evaluation (Signed)
Anesthesia Post Note  Patient: [name]  Procedure(s) Performed: Procedure(s) (LRB): IRRIGATION AND DEBRIDEMENT LT ANKLE  (Left) APPLICATION OF WOUND VAC (Left)  Anesthesia type: general  Patient location: PACU  Post pain: Pain level controlled  Post assessment: Patient's Cardiovascular Status Stable  Last Vitals:  Filed Vitals:   06/05/12 1120  BP:   Pulse:   Temp: 36.7 C  Resp:     Post vital signs: Reviewed and stable  Level of consciousness: sedated  Complications: No apparent anesthesia complications

## 2012-06-05 NOTE — Anesthesia Preprocedure Evaluation (Addendum)
Anesthesia Evaluation  Patient identified by MRN, date of birth, ID band Patient awake    Reviewed: Allergy & Precautions, H&P , NPO status , Patient's Chart, lab work & pertinent test results  History of Anesthesia Complications Negative for: history of anesthetic complications  Airway Mallampati: I TM Distance: >3 FB Neck ROM: Full    Dental  (+) Edentulous Lower and Edentulous Upper   Pulmonary sleep apnea ,    Pulmonary exam normal       Cardiovascular hypertension, +CHF + dysrhythmias Atrial Fibrillation     Neuro/Psych Depression    GI/Hepatic negative GI ROS,   Endo/Other  diabetes, Insulin DependentHypothyroidism   Renal/GU Renal InsufficiencyRenal disease     Musculoskeletal   Abdominal   Peds  Hematology  (+) Blood dyscrasia, anemia ,   Anesthesia Other Findings   Reproductive/Obstetrics                          Anesthesia Physical Anesthesia Plan  ASA: III  Anesthesia Plan: General   Post-op Pain Management:    Induction: Intravenous  Airway Management Planned: LMA  Additional Equipment:   Intra-op Plan:   Post-operative Plan: Extubation in OR  Informed Consent: I have reviewed the patients History and Physical, chart, labs and discussed the procedure including the risks, benefits and alternatives for the proposed anesthesia with the patient or authorized representative who has indicated his/her understanding and acceptance.   Dental advisory given  Plan Discussed with: CRNA, Anesthesiologist and Surgeon  Anesthesia Plan Comments:        Anesthesia Quick Evaluation

## 2012-06-05 NOTE — Progress Notes (Signed)
Patient ID: [name], male   DOB: 01-Oct-1940, 72 y.o.   MRN: [id number] Plan for surgical intervention late today with irrigation debridement abscess, excision of necrotic tendon, left ankle and placement of wound VAC.

## 2012-06-05 NOTE — Op Note (Signed)
OPERATIVE REPORT  DATE OF SURGERY: 06/05/2012  PATIENT:  [name],  72 y.o. male  PRE-OPERATIVE DIAGNOSIS:  abscess ulceration and necrotic tissue tendons and abscess left ankle   POST-OPERATIVE DIAGNOSIS:  abscess ulceration and necrotic tissue tendons and abscess left ankle   PROCEDURE:  Procedure(s): IRRIGATION AND DEBRIDEMENT LT ANKLE Excisional debridement of skin soft tissue muscle and extensor tendons left ankle. Wound measurements 7 x 5 cm. Application of wound VAC.  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 Patient was admitted do to the abscess cellulitis and extensive infection and presents at this time for the above-mentioned procedure. Risks and benefits were discussed with the patient and his daughter including infection neurovascular injury nonhealing of the wound potential for transtibial amputation. Patient and his daughter state they understand and wish to proceed at this time. Description of procedure patient was brought to the operating room and underwent a general anesthetic. After adequate levels of anesthesia were obtained patient's left lower extremity was prepped using DuraPrep draped into a sterile field. A 10 blade knife was used to excise skin soft tissue muscle and extensor tendon from the anterior aspect of the ankle. This was debrided down to petechial bleeding. Pulsatile lavage was used to further cleanse the wound. A wound VAC was applied set to -95 mm of suction. This had a good suction fit. Patient was extubated taken to the PACU in stable condition continue IV antibiotics continue with the wound VAC.  PLAN OF CARE:  Admit to inpatient   PATIENT DISPOSITION:  PACU - hemodynamically stable.   Nadara Mustard, MD 06/05/2012 9:49 AM

## 2012-06-05 NOTE — Progress Notes (Signed)
[facility] PROGRESS NOTE  OISIN YOAKUM VWU:[id number] DOB: Mar 19, 1940 DOA: 06/03/2012 PCP: Loreen Freud, DO  Assessment/Plan: 1- Left ankle with abscess ulceration and necrotic tendons/ early osteomyelitis:  - continue with Vancomycin and Zosyn day 3.  -Patient s/p Excisional debridement of skin soft tissue and tendon and placement of a wound VAC 4-4 by Dr Lajoyce Corners.  -Dr Lajoyce Corners will re-vealuate on Monday need for amputation.   DM (diabetes mellitus) type II controlled with renal manifestation  - A1C > 11. Lantus 40 units BID. SSI.   Hyponatremia  - secondary to pre renal etiology. Resolved with IV fluids. Lasix on hold.   Acute on chronic renal failure  - continue IVF. -resolved with IV fluids. Cr peak to 1.4.  -Cr has decrease to 1.1 ANEMIA, PERNICIOUS  -Hb stable. Monitor post surgery.   HYPERTENSION  Continue with Diltiazem.  BKA, RIGHT LEG  - appears stable, Dr. Lajoyce Corners has performed surgery in 2009  Atrial fibrillation  - continue Diltiazem and Coumadin. Monitor on telemetry.  Hypokalemia  -Resolved.    Code Status: Full Family Communication: Care discussed with patient.  Disposition Plan: to be determine   Consultants:  Dr Lajoyce Corners  Procedures: Excisional debridement of skin soft tissue and tendon and placement of a wound VAC 4-4   Antibiotics:  Vancomycin 4-2  Zosyn 4-2  HPI/Subjective: Feeling well, no significant pain.   Objective: Filed Vitals:   06/05/12 1030 06/05/12 1045 06/05/12 1115 06/05/12 1120  BP: 113/63 127/75 122/62   Pulse: 110 103 110   Temp:    98.1 F (36.7 C)  TempSrc:      Resp:      Height:      Weight:      SpO2: 96% 97% 98%     Intake/Output Summary (Last 24 hours) at 06/05/12 1444 Last data filed at 06/05/12 1232  Gross per 24 hour  Intake 4234.5 ml  Output   2490 ml  Net 1744.5 ml   Filed Weights   06/03/12 2100 06/04/12 0034  Weight: 163.295 kg (360 lb) 141.114 kg (311 lb 1.6 oz)    Exam:   General:  No  distress.  Cardiovascular: S 1, S 2 RRR  Respiratory: CTA  Abdomen: Bs present, soft, NT  Musculoskeletal:Right below knee amputation. Left LE with wound vac in place.   Data Reviewed: Basic Metabolic Panel:  Recent Labs Lab 06/03/12 1930 06/05/12 1200  NA 130* 141  K 3.4* 3.7  CL 93* 104  CO2 25 30  GLUCOSE 357* 188*  BUN 31* 18  CREATININE 1.47* 1.15  CALCIUM 9.2 9.0   Liver Function Tests:  Recent Labs Lab 06/03/12 1930  AST 12  ALT 11  ALKPHOS 52  BILITOT 0.5  PROT 7.3  ALBUMIN 2.9*   No results found for this basename: LIPASE, AMYLASE,  in the last 168 hours No results found for this basename: AMMONIA,  in the last 168 hours CBC:  Recent Labs Lab 06/03/12 1930 06/05/12 1200  WBC 9.6 5.7  NEUTROABS 7.9*  --   HGB 11.3* 11.6*  HCT 33.0* 34.5*  MCV 87.1 85.4  PLT 206 180   Cardiac Enzymes: No results found for this basename: CKTOTAL, CKMB, CKMBINDEX, TROPONINI,  in the last 168 hours BNP (last 3 results) No results found for this basename: PROBNP,  in the last 8760 hours CBG:  Recent Labs Lab 06/04/12 2248 06/05/12 0641 06/05/12 0827 06/05/12 1016 06/05/12 1148  GLUCAP 160* 164* 160* 182* 184*  Recent Results (from the past 240 hour(s))  CULTURE, BLOOD (ROUTINE X 2)     Status: None   Collection Time    06/03/12  7:30 PM      Result Value Range Status   Specimen Description BLOOD RIGHT ARM   Final   Special Requests BOTTLES DRAWN AEROBIC AND ANAEROBIC   Final   Culture  Setup Time 06/03/2012 23:27   Final   Culture     Final   Value:        BLOOD CULTURE RECEIVED NO GROWTH TO DATE CULTURE WILL BE HELD FOR 5 DAYS BEFORE ISSUING A FINAL NEGATIVE REPORT   Report Status PENDING   Incomplete  CULTURE, BLOOD (ROUTINE X 2)     Status: None   Collection Time    06/03/12  7:35 PM      Result Value Range Status   Specimen Description BLOOD RIGHT ARM   Final   Special Requests BOTTLES DRAWN AEROBIC AND ANAEROBIC   Final   Culture   Setup Time 06/03/2012 23:24   Final   Culture     Final   Value:        BLOOD CULTURE RECEIVED NO GROWTH TO DATE CULTURE WILL BE HELD FOR 5 DAYS BEFORE ISSUING A FINAL NEGATIVE REPORT   Report Status PENDING   Incomplete  URINE CULTURE     Status: None   Collection Time    06/03/12  8:21 PM      Result Value Range Status   Specimen Description URINE, CLEAN CATCH   Final   Special Requests NONE   Final   Culture  Setup Time 06/04/2012 02:48   Final   Colony Count >=100,000 COLONIES/ML   Final   Culture GRAM NEGATIVE RODS   Final   Report Status PENDING   Incomplete  MRSA PCR SCREENING     Status: None   Collection Time    06/04/12  2:25 AM      Result Value Range Status   MRSA by PCR NEGATIVE  NEGATIVE Final   Comment:            The GeneXpert MRSA Assay (FDA     approved for NASAL specimens     only), is one component of a     comprehensive MRSA colonization     surveillance program. It is not     intended to diagnose MRSA     infection nor to guide or     monitor treatment for     MRSA infections.  CLOSTRIDIUM DIFFICILE BY PCR     Status: None   Collection Time    06/04/12  6:02 AM      Result Value Range Status   C difficile by pcr NEGATIVE  NEGATIVE Final  SURGICAL PCR SCREEN     Status: None   Collection Time    06/05/12  7:57 AM      Result Value Range Status   MRSA, PCR NEGATIVE  NEGATIVE Final   Staphylococcus aureus NEGATIVE  NEGATIVE Final   Comment:            The Xpert SA Assay (FDA     approved for NASAL specimens     in patients over 16 years of age),     is one component of     a comprehensive surveillance     program.  Test performance has     been validated by [employer]  for patients greater     than or equal to 73 year old.     It is not intended     to diagnose infection nor to     guide or monitor treatment.     Studies: Dg Ankle Complete Left  06-27-12  *RADIOLOGY REPORT*  Clinical Data: Open wound on the dorsum of the forefoot.  LEFT  ANKLE COMPLETE - 3+ VIEW  Comparison: None.  Findings: Osteopenia.  No acute fracture.  No dislocation. Spurring at the inferior calcaneus.  Degenerative changes in the ankle joint.  IMPRESSION: Chronic changes.  No acute bony pathology.   Original Report Authenticated By: Jolaine Click, M.D.    Mr Ankle Left W Wo Contrast  06/04/2012   * RADIOLOGY REPORT *  Clinical Data:  Draining anterior ankle pain with pain and swelling for 2 weeks.  Evaluate for osteomyelitis.  MRI OF THE LEFT ANKLE WITHOUT AND WITH CONTRAST  Technique: Multiplanar, multisequence MR imaging of the  left ankle was performed before and after the administration of intravenous contrast.  Comparison:  Radiographs 2012/06/27 and left forefoot MRI 12/28/2009.  Contrast: 20mL MULTIHANCE GADOBENATE DIMEGLUMINE 529 MG/ML IV SOLN  FINDINGS: There is an irregular open wound anteriorly at the ankle associated with ill-defined fluid, soft tissue edema and enhancement around the extensor tendons.  There is an ill-defined approximately 3.7 x 1.5 cm peripherally enhancing fluid collection associated with the tibialis anterior tendon, best seen on axial postcontrast image number 13. The foot musculature is diffusely atrophied.  TENDONS Peroneal: There is a longitudinal split tear of the peroneus brevis tendon without discontinuity or associated peroneal sheath fluid. Posteromedial:  Intact. Anterior: As above, there is concern of infectious tenosynovitis associated with the extensor tendons, especially the tibialis anterior.  No tendon disruption is identified. 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  The distal tendon is attenuated, but intact. There is some calcification within the tendon.  No surrounding inflammatory change or fluid collection is seen. Plantar Fascia: There is chronic thickening of the medial cord of the plantar fascia associated with a moderate sized plantar calcaneal spur.  No acute abnormality  identified.  LIGAMENTS Lateral: Intact. Medial: Intact.  CARTILAGE Ankle Joint: There is no significant ankle joint effusion, although there is mild synovial enhancement following contrast.  There is nonspecific subchondral cyst formation anteriorly in the talar dome and tibial plafond, likely degenerative.  There is no cortical destruction. Subtalar Joints/Sinus Tarsi: The subtalar joint and tarsal sinus appear normal. Bones: There is ill-defined marrow edema and enhancement within the medial and middle cuneiforms.  No cortical destruction is identified on the T1-weighted images.  This is immediately adjacent to the suspected infectious tenosynovitis involving the extensor tendons and could be related to hyperemia, although early osteomyelitis cannot be excluded.  There is mild nonspecific edema and enhancement within the navicular.  No other significant osseous abnormalities are identified.  IMPRESSION:  1.  Open wound anteriorly at the ankle with ill-defined peripheral enhancing fluid collection associated with the extensor tendons highly suspicious for infectious tenosynovitis and abscess formation. 2.  Adjacent marrow edema and enhancement within the medial and middle cuneiforms is nonspecific, although potentially secondary to early osteomyelitis. 3.  Suspected degenerative subchondral cyst formation anteriorly at the tibiotalar joint.  No specific signs of ankle joint infection. 4.  Chronic Achilles tendinosis and partial tearing, partial longitudinal split tear of the peroneus brevis and chronic thickening of the plantar fascia.   Original Report Authenticated By:  Carey Bullocks, M.D.    Dg Foot Complete Left  06/03/2012  *RADIOLOGY REPORT*  Clinical Data: Open wound on foot  LEFT FOOT - COMPLETE 3+ VIEW  Comparison: 12/20/2009  Findings: Severe osteopenia.  Periosteal reaction along the third and fourth metatarsals is stable.  No acute fracture.  No dislocation.  Erosion at the dorsum of the navicular is  stable. No definite destructive bone lesion.  IMPRESSION: No acute bony pathology.  Chronic changes.   Original Report Authenticated By: Jolaine Click, M.D.     Scheduled Meds: . allopurinol  100 mg Oral Daily  . atorvastatin  10 mg Oral q1800  . diltiazem  240 mg Oral Daily  . fenofibrate  160 mg Oral Daily  . FLUoxetine  20 mg Oral BID  . insulin aspart  0-15 Units Subcutaneous TID WC  . insulin glargine  40 Units Subcutaneous BID  . levothyroxine  200 mcg Oral QAC breakfast  . pantoprazole  40 mg Oral Daily  . piperacillin-tazobactam (ZOSYN)  IV  3.375 g Intravenous Q8H  . potassium chloride  10 mEq Oral Daily  . rOPINIRole  2 mg Oral QHS  . sodium chloride  3 mL Intravenous Q12H  . tamsulosin  0.4 mg Oral Daily  . vancomycin  2,000 mg Intravenous Q24H  . warfarin  10 mg Oral ONCE-1800   Continuous Infusions: . sodium chloride 75 mL/hr at 06/04/12 1644  . 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     REGALADO,BELKYS  [facility] Pager [phone]. If 7PM-7AM, please contact night-coverage at [email or web address], password [facility] 06/05/2012, 2:44 PM  LOS: 2 days

## 2012-06-05 NOTE — Progress Notes (Signed)
Excisional debridement of skin soft tissue and tendon and placement of a wound VAC this morning. Patient had a large area of necrotic tendon and soft tissue. We will reevaluate the wound Monday when the wound VAC is changed. If patient is not showing evidence of good granulation tissue would need to consider a transtibial amputation. We will reevaluate patient on Monday.

## 2012-06-05 NOTE — Progress Notes (Signed)
ANTICOAGULATION CONSULT NOTE - Initial Consult  Pharmacy Consult for Coumadin Indication: atrial fibrillation  No Known Allergies  Patient Measurements: Height: 6' (182.9 cm) Weight: 311 lb 1.6 oz (141.114 kg) IBW/kg (Calculated) : 77.6 Heparin Dosing Weight:   Vital Signs: Temp: 98.1 F (36.7 C) (04/04 1120) Temp src: Oral (04/04 0755) BP: 122/62 mmHg (04/04 1115) Pulse Rate: 110 (04/04 1115)  Labs:  Recent Labs  06/03/12 1930 06/04/12 0130 06/05/12 1200  HGB 11.3*  --  11.6*  HCT 33.0*  --  34.5*  PLT 206  --  180  LABPROT  --  17.3*  --   INR  --  1.46  --   CREATININE 1.47*  --  1.15    Estimated Creatinine Clearance: 83.3 ml/min (by C-G formula based on Cr of 1.15).   Medical History: Past Medical History  Diagnosis Date  . Depression   . Hyperlipidemia   . Hypertension   . Chronic kidney disease   . CHF (congestive heart failure)   . Sleep apnea   . Gout   . Foot ulcer, left 06/04/2012    dorsum   . Type II diabetes mellitus   . Osteoarthritis     "plenty" (06/04/2012)  . Gout     Medications:  Scheduled:  . allopurinol  100 mg Oral Daily  . atorvastatin  10 mg Oral q1800  . [COMPLETED] chlorhexidine  60 mL Topical Once  . diltiazem  240 mg Oral Daily  . fenofibrate  160 mg Oral Daily  . FLUoxetine  20 mg Oral BID  . insulin aspart  0-15 Units Subcutaneous TID WC  . insulin glargine  40 Units Subcutaneous BID  . levothyroxine  200 mcg Oral QAC breakfast  . pantoprazole  40 mg Oral Daily  . piperacillin-tazobactam (ZOSYN)  IV  3.375 g Intravenous Q8H  . potassium chloride  10 mEq Oral Daily  . rOPINIRole  2 mg Oral QHS  . sodium chloride  3 mL Intravenous Q12H  . tamsulosin  0.4 mg Oral Daily  . vancomycin  2,000 mg Intravenous Q24H  . warfarin  10 mg Oral Custom  . warfarin  5 mg Oral Custom  . Warfarin - Physician Dosing Inpatient   Does not apply q1800  . [DISCONTINUED] bupivacaine  5 mL Other Once  . [DISCONTINUED] furosemide  20 mg  Oral Daily  . [DISCONTINUED] lidocaine (PF)  5 mL Other Once    Assessment: 72yo male on Coumadin for history of AFib.  He is s/p I&D today, to continue Coumadin.  No INR was drawn this AM , but INR was 1.46 yesterday and do not expect it to be within goal today.  No problems noted.  Goal of Therapy:  INR 2-3 Monitor platelets by anticoagulation protocol: Yes   Plan:  1.  Coumadin 10mg  today 2.  Daily INR 3.  Coumadin book and video  Marisue Humble, PharmD Clinical Pharmacist [facility]

## 2012-06-05 NOTE — Transfer of Care (Signed)
Immediate Anesthesia Transfer of Care Note  Patient: [name]  Procedure(s) Performed: Procedure(s): IRRIGATION AND DEBRIDEMENT LT ANKLE  (Left) APPLICATION OF WOUND VAC (Left)  Patient Location: PACU  Anesthesia Type:General  Level of Consciousness: awake, alert , oriented and patient cooperative  Airway & Oxygen Therapy: Patient Spontanous Breathing and Patient connected to nasal cannula oxygen  Post-op Assessment: Report given to PACU RN, Post -op Vital signs reviewed and stable and Patient moving all extremities  Post vital signs: Reviewed and stable  Complications: No apparent anesthesia complications

## 2012-06-06 DIAGNOSIS — E876 Hypokalemia: Secondary | ICD-10-CM

## 2012-06-06 LAB — CBC
MCH: 29.4 pg (ref 26.0–34.0)
MCV: 87.5 fL (ref 78.0–100.0)
Platelets: 192 10*3/uL (ref 150–400)
RDW: 14.8 % (ref 11.5–15.5)

## 2012-06-06 LAB — BASIC METABOLIC PANEL
Calcium: 8.9 mg/dL (ref 8.4–10.5)
Creatinine, Ser: 1.22 mg/dL (ref 0.50–1.35)
GFR calc Af Amer: 66 mL/min — ABNORMAL LOW (ref >90)

## 2012-06-06 LAB — URINE CULTURE

## 2012-06-06 MED ORDER — WARFARIN SODIUM 10 MG PO TABS
10.0000 mg | ORAL_TABLET | Freq: Once | ORAL | Status: AC
  Administered 2012-06-06: 10 mg via ORAL
  Filled 2012-06-06: qty 1

## 2012-06-06 MED ORDER — WARFARIN - PHARMACIST DOSING INPATIENT: Freq: Every day | Status: DC

## 2012-06-06 MED ORDER — POTASSIUM CHLORIDE CRYS ER 20 MEQ PO TBCR
40.0000 meq | EXTENDED_RELEASE_TABLET | Freq: Once | ORAL | Status: AC
  Administered 2012-06-06: 40 meq via ORAL
  Filled 2012-06-06: qty 2

## 2012-06-06 NOTE — Progress Notes (Signed)
Pt stable with wound vac reval mon for BKA with Lajoyce Corners

## 2012-06-06 NOTE — Progress Notes (Signed)
[facility] PROGRESS NOTE  [name] QMV:[id number] DOB: [birth year] DOA: 06/03/2012 PCP: Loreen Freud, DO  Assessment/Plan: 1- Left ankle with abscess ulceration and necrotic tendons/ early osteomyelitis:  - continue with Vancomycin and Zosyn day 4.  -Patient s/p Excisional debridement of skin soft tissue and tendon and placement of a wound VAC 4-4 by Dr Lajoyce Corners.  -Dr Lajoyce Corners will re-vealuate on Monday need for amputation.  -no worsening pain.  DM (diabetes mellitus) type II controlled with renal manifestation  - A1C > 11. Lantus 40 units BID. SSI.  -Likely complaint with medications. CBG 96 to 180.   Hyponatremia  - secondary to pre renal etiology. Resolved with IV fluids. Lasix on hold.   Acute on chronic renal failure  - continue IVF. -resolved with IV fluids. Cr peak to 1.4.  -Cr has decrease to 1.2 ANEMIA, PERNICIOUS  -Hb stable. Monitor post surgery.   HYPERTENSION  Continue with Diltiazem.  BKA, RIGHT LEG  - appears stable, Dr. Lajoyce Corners has performed surgery in 2009  Atrial fibrillation  - continue Diltiazem and Coumadin. Monitor on telemetry.  Hypokalemia  -Replete with 40 meq times 1.  UTI Klebsiella: Sensitive to zosyn day 4 antibiotics.    Code Status: Full Family Communication: Care discussed with patient.  Disposition Plan: to be determine   Consultants:  Dr Lajoyce Corners  Procedures: Excisional debridement of skin soft tissue and tendon and placement of a wound VAC 4-4   Antibiotics:  Vancomycin 4-2  Zosyn 4-2  HPI/Subjective: Feeling well, no significant pain.   Objective: Filed Vitals:   06/05/12 1120 06/05/12 1445 06/05/12 2311 06/06/12 0616  BP:  143/93 110/48 122/68  Pulse:  85 78 102  Temp: 98.1 F (36.7 C) 97.9 F (36.6 C) 98.4 F (36.9 C) 97.4 F (36.3 C)  TempSrc:  Oral    Resp:  18 19 18   Height:      Weight:      SpO2:  98% 92% 94%    Intake/Output Summary (Last 24 hours) at 06/06/12 1550 Last data filed at 06/05/12 1842  Gross per 24 hour  Intake    240 ml  Output    435 ml  Net   -195 ml   Filed Weights   06/03/12 2100 06/04/12 0034  Weight: 163.295 kg (360 lb) 141.114 kg (311 lb 1.6 oz)    Exam:   General:  No distress.  Cardiovascular: S 1, S 2 RRR  Respiratory: CTA  Abdomen: Bs present, soft, NT  Musculoskeletal:Right below knee amputation. Left LE with wound vac in place.   Data Reviewed: Basic Metabolic Panel:  Recent Labs Lab 06/03/12 1930 06/05/12 1200 06/06/12 0446  NA 130* 141 139  K 3.4* 3.7 3.1*  CL 93* 104 103  CO2 25 30 28   GLUCOSE 357* 188* 85  BUN 31* 18 15  CREATININE 1.47* 1.15 1.22  CALCIUM 9.2 9.0 8.9   Liver Function Tests:  Recent Labs Lab 06/03/12 1930  AST 12  ALT 11  ALKPHOS 52  BILITOT 0.5  PROT 7.3  ALBUMIN 2.9*   No results found for this basename: LIPASE, AMYLASE,  in the last 168 hours No results found for this basename: AMMONIA,  in the last 168 hours CBC:  Recent Labs Lab 06/03/12 1930 06/05/12 1200 06/06/12 0446  WBC 9.6 5.7 6.1  NEUTROABS 7.9*  --   --   HGB 11.3* 11.6* 10.8*  HCT 33.0* 34.5* 32.1*  MCV 87.1 85.4 87.5  PLT 206 180 192  Cardiac Enzymes: No results found for this basename: CKTOTAL, CKMB, CKMBINDEX, TROPONINI,  in the last 168 hours BNP (last 3 results) No results found for this basename: PROBNP,  in the last 8760 hours CBG:  Recent Labs Lab 06/05/12 1148 06/05/12 1641 06/05/12 2320 06/06/12 0633 06/06/12 1143  GLUCAP 184* 192* 70 96 132*    Recent Results (from the past 240 hour(s))  CULTURE, BLOOD (ROUTINE X 2)     Status: None   Collection Time    06/03/12  7:30 PM      Result Value Range Status   Specimen Description BLOOD RIGHT ARM   Final   Special Requests BOTTLES DRAWN AEROBIC AND ANAEROBIC   Final   Culture  Setup Time 06/03/2012 23:27   Final   Culture     Final   Value:        BLOOD CULTURE RECEIVED NO GROWTH TO DATE CULTURE WILL BE HELD FOR 5 DAYS BEFORE ISSUING A FINAL  NEGATIVE REPORT   Report Status PENDING   Incomplete  CULTURE, BLOOD (ROUTINE X 2)     Status: None   Collection Time    06/03/12  7:35 PM      Result Value Range Status   Specimen Description BLOOD RIGHT ARM   Final   Special Requests BOTTLES DRAWN AEROBIC AND ANAEROBIC   Final   Culture  Setup Time 06/03/2012 23:24   Final   Culture     Final   Value:        BLOOD CULTURE RECEIVED NO GROWTH TO DATE CULTURE WILL BE HELD FOR 5 DAYS BEFORE ISSUING A FINAL NEGATIVE REPORT   Report Status PENDING   Incomplete  URINE CULTURE     Status: None   Collection Time    06/03/12  8:21 PM      Result Value Range Status   Specimen Description URINE, CLEAN CATCH   Final   Special Requests NONE   Final   Culture  Setup Time 06/04/2012 02:48   Final   Colony Count >=100,000 COLONIES/ML   Final   Culture KLEBSIELLA OXYTOCA   Final   Report Status 06/06/2012 FINAL   Final   Organism ID, Bacteria KLEBSIELLA OXYTOCA   Final  MRSA PCR SCREENING     Status: None   Collection Time    06/04/12  2:25 AM      Result Value Range Status   MRSA by PCR NEGATIVE  NEGATIVE Final   Comment:            The GeneXpert MRSA Assay (FDA     approved for NASAL specimens     only), is one component of a     comprehensive MRSA colonization     surveillance program. It is not     intended to diagnose MRSA     infection nor to guide or     monitor treatment for     MRSA infections.  CLOSTRIDIUM DIFFICILE BY PCR     Status: None   Collection Time    06/04/12  6:02 AM      Result Value Range Status   C difficile by pcr NEGATIVE  NEGATIVE Final  SURGICAL PCR SCREEN     Status: None   Collection Time    06/05/12  7:57 AM      Result Value Range Status   MRSA, PCR NEGATIVE  NEGATIVE Final   Staphylococcus aureus NEGATIVE  NEGATIVE Final   Comment:  The Xpert SA Assay (FDA     approved for NASAL specimens     in patients over 11 years of age),     is one component of     a comprehensive surveillance      program.  Test performance has     been validated by [employer] for patients greater     than or equal to 1 year old.     It is not intended     to diagnose infection nor to     guide or monitor treatment.     Studies: No results found.  Scheduled Meds: . allopurinol  100 mg Oral Daily  . atorvastatin  10 mg Oral q1800  . diltiazem  240 mg Oral Daily  . fenofibrate  160 mg Oral Daily  . FLUoxetine  20 mg Oral BID  . insulin aspart  0-15 Units Subcutaneous TID WC  . insulin glargine  40 Units Subcutaneous BID  . levothyroxine  200 mcg Oral QAC breakfast  . pantoprazole  40 mg Oral Daily  . piperacillin-tazobactam (ZOSYN)  IV  3.375 g Intravenous Q8H  . potassium chloride  10 mEq Oral Daily  . rOPINIRole  2 mg Oral QHS  . sodium chloride  3 mL Intravenous Q12H  . tamsulosin  0.4 mg Oral Daily  . vancomycin  2,000 mg Intravenous Q24H  . warfarin  10 mg Oral ONCE-1800  . Warfarin - Pharmacist Dosing Inpatient   Does not apply q1800   Continuous Infusions: . 0.9 % NaCl with KCl 20 mEq / L 75 mL/hr at 06/05/12 1821  . 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     Arrington Bencomo  [facility] Pager [phone]. If 7PM-7AM, please contact night-coverage at [email or web address], password [facility] 06/06/2012, 3:50 PM  LOS: 3 days

## 2012-06-06 NOTE — Progress Notes (Signed)
ANTICOAGULATION CONSULT NOTE - Follow Up Consult  Pharmacy Consult for Coumadin Indication: atrial fibrillation  No Known Allergies  Patient Measurements: Height: 6' (182.9 cm) Weight: 311 lb 1.6 oz (141.114 kg) IBW/kg (Calculated) : 77.6 Heparin Dosing Weight:   Vital Signs: Temp: 97.4 F (36.3 C) (04/05 0616) BP: 122/68 mmHg (04/05 0616) Pulse Rate: 102 (04/05 0616)  Labs:  Recent Labs  06/03/12 1930 06/04/12 0130 06/05/12 1200 06/06/12 0446  HGB 11.3*  --  11.6* 10.8*  HCT 33.0*  --  34.5* 32.1*  PLT 206  --  180 192  LABPROT  --  17.3*  --  18.5*  INR  --  1.46  --  1.59*  CREATININE 1.47*  --  1.15 1.22    Estimated Creatinine Clearance: 78.6 ml/min (by C-G formula based on Cr of 1.22).   Medications:  Scheduled:  . allopurinol  100 mg Oral Daily  . atorvastatin  10 mg Oral q1800  . diltiazem  240 mg Oral Daily  . fenofibrate  160 mg Oral Daily  . FLUoxetine  20 mg Oral BID  . insulin aspart  0-15 Units Subcutaneous TID WC  . insulin glargine  40 Units Subcutaneous BID  . levothyroxine  200 mcg Oral QAC breakfast  . pantoprazole  40 mg Oral Daily  . piperacillin-tazobactam (ZOSYN)  IV  3.375 g Intravenous Q8H  . potassium chloride  10 mEq Oral Daily  . [COMPLETED] potassium chloride  40 mEq Oral Once  . rOPINIRole  2 mg Oral QHS  . sodium chloride  3 mL Intravenous Q12H  . tamsulosin  0.4 mg Oral Daily  . vancomycin  2,000 mg Intravenous Q24H  . [COMPLETED] warfarin  10 mg Oral ONCE-1800  . [DISCONTINUED] warfarin  10 mg Oral Custom  . [DISCONTINUED] warfarin  5 mg Oral Custom  . [DISCONTINUED] Warfarin - Physician Dosing Inpatient   Does not apply q1800    Assessment: 72yo male with AFib.  INR 1.59 this AM.  No problems noted.  Currently being evaluated for possible need for BKA  Goal of Therapy:  INR 2-3 Monitor platelets by anticoagulation protocol: Yes   Plan:  1.  Coumadin 10mg  today 2.  F/U in AM  Marisue Humble, PharmD Clinical  Pharmacist [facility]

## 2012-06-07 LAB — BASIC METABOLIC PANEL
CO2: 28 mEq/L (ref 19–32)
Chloride: 101 mEq/L (ref 96–112)
Creatinine, Ser: 1.15 mg/dL (ref 0.50–1.35)

## 2012-06-07 LAB — PROTIME-INR: INR: 1.66 — ABNORMAL HIGH (ref 0.00–1.49)

## 2012-06-07 LAB — VANCOMYCIN, TROUGH: Vancomycin Tr: 18.7 ug/mL (ref 10.0–20.0)

## 2012-06-07 LAB — GLUCOSE, CAPILLARY
Glucose-Capillary: 110 mg/dL — ABNORMAL HIGH (ref 70–99)
Glucose-Capillary: 111 mg/dL — ABNORMAL HIGH (ref 70–99)
Glucose-Capillary: 133 mg/dL — ABNORMAL HIGH (ref 70–99)
Glucose-Capillary: 103 mg/dL — ABNORMAL HIGH (ref 70–99)

## 2012-06-07 MED ORDER — WARFARIN SODIUM 10 MG PO TABS
10.0000 mg | ORAL_TABLET | Freq: Once | ORAL | Status: AC
  Administered 2012-06-07: 10 mg via ORAL
  Filled 2012-06-07: qty 1

## 2012-06-07 MED ORDER — POLYETHYLENE GLYCOL 3350 17 G PO PACK
17.0000 g | PACK | Freq: Two times a day (BID) | ORAL | Status: DC
  Filled 2012-06-07 (×2): qty 1

## 2012-06-07 NOTE — Clinical Social Work Note (Signed)
CSW consulted for SNF. Awaiting PT/OT evals for discharge recommendations, which are needed for prior auth for SNF placement.  CSW will continue to follow.  Dellie Burns, MSW, LCSWA [phone] (Weekends 8:00am-4:30pm)

## 2012-06-07 NOTE — Progress Notes (Signed)
pANTICOAGULATION CONSULT NOTE - Follow Up Consult  Pharmacy Consult for Coumadin Indication: atrial fibrillation  No Known Allergies  Patient Measurements: Height: 6' (182.9 cm) Weight: 311 lb 1.6 oz (141.114 kg) IBW/kg (Calculated) : 77.6 Heparin Dosing Weight:   Vital Signs: Temp: 97.4 F (36.3 C) (04/06 0632) BP: 111/56 mmHg (04/06 1128) Pulse Rate: 82 (04/06 0632)  Labs:  Recent Labs  06/05/12 1200 06/06/12 0446 06/07/12 0555 06/07/12 0818  HGB 11.6* 10.8*  --   --   HCT 34.5* 32.1*  --   --   PLT 180 192  --   --   LABPROT  --  18.5* 19.1*  --   INR  --  1.59* 1.66*  --   CREATININE 1.15 1.22  --  1.15    Estimated Creatinine Clearance: 83.3 ml/min (by C-G formula based on Cr of 1.15).   Medications:  Scheduled:  . allopurinol  100 mg Oral Daily  . atorvastatin  10 mg Oral q1800  . diltiazem  240 mg Oral Daily  . fenofibrate  160 mg Oral Daily  . FLUoxetine  20 mg Oral BID  . insulin aspart  0-15 Units Subcutaneous TID WC  . insulin glargine  40 Units Subcutaneous BID  . levothyroxine  200 mcg Oral QAC breakfast  . pantoprazole  40 mg Oral Daily  . piperacillin-tazobactam (ZOSYN)  IV  3.375 g Intravenous Q8H  . potassium chloride  10 mEq Oral Daily  . rOPINIRole  2 mg Oral QHS  . sodium chloride  3 mL Intravenous Q12H  . tamsulosin  0.4 mg Oral Daily  . vancomycin  2,000 mg Intravenous Q24H  . [COMPLETED] warfarin  10 mg Oral ONCE-1800  . Warfarin - Pharmacist Dosing Inpatient   Does not apply q1800    Assessment: 72yo male with AFib,  INR moving toward goal, at 1.66 this AM.  No bleeding problems noted.    Goal of Therapy:  INR 2-3 Monitor platelets by anticoagulation protocol: Yes   Plan:  1.  Coumadin 10mg  2.  F/U in AM  Marisue Humble, PharmD Clinical Pharmacist [facility]

## 2012-06-07 NOTE — Progress Notes (Signed)
ANTIBIOTIC CONSULT NOTE Pharmacy Consult for vancomycin Indication: ankle abcess/osteomyelitis  No Known Allergies  Patient Measurements: Height: 6' (182.9 cm) Weight: 311 lb 1.6 oz (141.114 kg) IBW/kg (Calculated) : 77.6   Vital Signs: Temp: 97.8 F (36.6 C) (04/06 2212) Temp src: Oral (04/06 1700) BP: 110/59 mmHg (04/06 2212) Pulse Rate: 103 (04/06 2212) Intake/Output from previous day: 04/05 0701 - 04/06 0700 In: 560 [P.O.:560] Out: 800 [Urine:800] Intake/Output from this shift:    Labs:  Recent Labs  06/05/12 1200 06/06/12 0446 06/07/12 0818  WBC 5.7 6.1  --   HGB 11.6* 10.8*  --   PLT 180 192  --   CREATININE 1.15 1.22 1.15   Estimated Creatinine Clearance: 83.3 ml/min (by C-G formula based on Cr of 1.15).  Recent Labs  06/07/12 2141  VANCOTROUGH 18.7     Microbiology: Recent Results (from the past 720 hour(s))  CULTURE, BLOOD (ROUTINE X 2)     Status: None   Collection Time    06/03/12  7:30 PM      Result Value Range Status   Specimen Description BLOOD RIGHT ARM   Final   Special Requests BOTTLES DRAWN AEROBIC AND ANAEROBIC   Final   Culture  Setup Time 06/03/2012 23:27   Final   Culture     Final   Value:        BLOOD CULTURE RECEIVED NO GROWTH TO DATE CULTURE WILL BE HELD FOR 5 DAYS BEFORE ISSUING A FINAL NEGATIVE REPORT   Report Status PENDING   Incomplete  CULTURE, BLOOD (ROUTINE X 2)     Status: None   Collection Time    06/03/12  7:35 PM      Result Value Range Status   Specimen Description BLOOD RIGHT ARM   Final   Special Requests BOTTLES DRAWN AEROBIC AND ANAEROBIC   Final   Culture  Setup Time 06/03/2012 23:24   Final   Culture     Final   Value:        BLOOD CULTURE RECEIVED NO GROWTH TO DATE CULTURE WILL BE HELD FOR 5 DAYS BEFORE ISSUING A FINAL NEGATIVE REPORT   Report Status PENDING   Incomplete  URINE CULTURE     Status: None   Collection Time    06/03/12  8:21 PM      Result Value Range Status   Specimen Description  URINE, CLEAN CATCH   Final   Special Requests NONE   Final   Culture  Setup Time 06/04/2012 02:48   Final   Colony Count >=100,000 COLONIES/ML   Final   Culture KLEBSIELLA OXYTOCA   Final   Report Status 06/06/2012 FINAL   Final   Organism ID, Bacteria KLEBSIELLA OXYTOCA   Final  MRSA PCR SCREENING     Status: None   Collection Time    06/04/12  2:25 AM      Result Value Range Status   MRSA by PCR NEGATIVE  NEGATIVE Final   Comment:            The GeneXpert MRSA Assay (FDA     approved for NASAL specimens     only), is one component of a     comprehensive MRSA colonization     surveillance program. It is not     intended to diagnose MRSA     infection nor to guide or     monitor treatment for     MRSA infections.  CLOSTRIDIUM DIFFICILE BY PCR  Status: None   Collection Time    06/04/12  6:02 AM      Result Value Range Status   C difficile by pcr NEGATIVE  NEGATIVE Final  STOOL CULTURE     Status: None   Collection Time    06/04/12  9:10 AM      Result Value Range Status   Specimen Description STOOL   Final   Special Requests NONE   Final   Culture NO SUSPICIOUS COLONIES, CONTINUING TO HOLD   Final   Report Status PENDING   Incomplete  SURGICAL PCR SCREEN     Status: None   Collection Time    06/05/12  7:57 AM      Result Value Range Status   MRSA, PCR NEGATIVE  NEGATIVE Final   Staphylococcus aureus NEGATIVE  NEGATIVE Final   Comment:            The Xpert SA Assay (FDA     approved for NASAL specimens     in patients over 10 years of age),     is one component of     a comprehensive surveillance     program.  Test performance has     been validated by [employer] for patients greater     than or equal to 22 year old.     It is not intended     to diagnose infection nor to     guide or monitor treatment.   Assessment: 72 yo male with L ankle abcess s/p debridement for empiric antibiotics  Goal of Therapy:  Vancomycin Trough 15-20 mcg/mL  Plan:   Continue vancomycin 2 g IV q24h  Geannie Risen, PharmD, BCPS  06/07/2012 11:10 PM

## 2012-06-07 NOTE — Progress Notes (Signed)
[facility] PROGRESS NOTE  [name]:[id number] DOB: [birth year] DOA: 06/03/2012 PCP: Loreen Freud, DO  Assessment/Plan: 1- Left ankle with abscess ulceration and necrotic tendons/ early osteomyelitis:  -Continue with Vancomycin and Zosyn day 5.  -Patient s/p Excisional debridement of skin soft tissue and tendon and placement of a wound VAC 4-4 by Dr Lajoyce Corners.  -Dr Lajoyce Corners will re-vealuate on Monday 4-7 need for amputation.  -no worsening pain.  DM (diabetes mellitus) type II controlled with renal manifestation  - A1C > 11. Lantus 40 units BID. SSI.  -Likely complaint with medications. CBG 96 to 180.   Hyponatremia  - Secondary to pre renal etiology. Resolved with IV fluids. Lasix on hold.  Acute on chronic renal failure  -resolved with IV fluids. Cr peak to 1.4.  -Cr has decrease to 1.2 -NSL ANEMIA, PERNICIOUS  -Hb stable. Monitor post surgery.  HYPERTENSION  Continue with Diltiazem.  BKA, RIGHT LEG  - appears stable, Dr. Lajoyce Corners has performed surgery in 2009  Atrial fibrillation  - Continue Diltiazem and Coumadin. Discontinue telemetry. Hypokalemia  Replaced. UTI Klebsiella: Sensitive to zosyn day 5 antibiotics.    Code Status: Full Family Communication: Care discussed with patient.  Disposition Plan: to be determine   Consultants:  Dr Lajoyce Corners  Procedures: Excisional debridement of skin soft tissue and tendon and placement of a wound VAC 4-4   Antibiotics:  Vancomycin 4-2  Zosyn 4-2  HPI/Subjective: Feeling well, no significant pain.   Objective: Filed Vitals:   06/06/12 1645 06/06/12 2316 06/07/12 0632 06/07/12 1128  BP: 124/81 105/65 90/60 111/56  Pulse: 102 92 82   Temp: 98.4 F (36.9 C) 98.2 F (36.8 C) 97.4 F (36.3 C)   TempSrc: Oral     Resp: 18 20 20    Height:      Weight:      SpO2: 98% 93% 99%     Intake/Output Summary (Last 24 hours) at 06/07/12 1327 Last data filed at 06/06/12 1600  Gross per 24 hour  Intake    240 ml  Output     250 ml  Net    -10 ml   Filed Weights   06/03/12 2100 06/04/12 0034  Weight: 163.295 kg (360 lb) 141.114 kg (311 lb 1.6 oz)    Exam:   General:  No distress.  Cardiovascular: S 1, S 2 RRR  Respiratory: CTA  Abdomen: Bs present, soft, NT  Musculoskeletal:Right below knee amputation. Left LE with wound vac in place.   Data Reviewed: Basic Metabolic Panel:  Recent Labs Lab 06/03/12 1930 06/05/12 1200 06/06/12 0446 06/07/12 0818  NA 130* 141 139 138  K 3.4* 3.7 3.1* 3.5  CL 93* 104 103 101  CO2 25 30 28 28   GLUCOSE 357* 188* 85 118*  BUN 31* 18 15 10   CREATININE 1.47* 1.15 1.22 1.15  CALCIUM 9.2 9.0 8.9 9.1   Liver Function Tests:  Recent Labs Lab 06/03/12 1930  AST 12  ALT 11  ALKPHOS 52  BILITOT 0.5  PROT 7.3  ALBUMIN 2.9*   No results found for this basename: LIPASE, AMYLASE,  in the last 168 hours No results found for this basename: AMMONIA,  in the last 168 hours CBC:  Recent Labs Lab 06/03/12 1930 06/05/12 1200 06/06/12 0446  WBC 9.6 5.7 6.1  NEUTROABS 7.9*  --   --   HGB 11.3* 11.6* 10.8*  HCT 33.0* 34.5* 32.1*  MCV 87.1 85.4 87.5  PLT 206 180 192   Cardiac Enzymes:  No results found for this basename: CKTOTAL, CKMB, CKMBINDEX, TROPONINI,  in the last 168 hours BNP (last 3 results) No results found for this basename: PROBNP,  in the last 8760 hours CBG:  Recent Labs Lab 06/05/12 1641 06/05/12 2320 06/06/12 0633 06/06/12 1143 06/06/12 1651  GLUCAP 192* 70 96 132* 110*    Recent Results (from the past 240 hour(s))  CULTURE, BLOOD (ROUTINE X 2)     Status: None   Collection Time    06/03/12  7:30 PM      Result Value Range Status   Specimen Description BLOOD RIGHT ARM   Final   Special Requests BOTTLES DRAWN AEROBIC AND ANAEROBIC   Final   Culture  Setup Time 06/03/2012 23:27   Final   Culture     Final   Value:        BLOOD CULTURE RECEIVED NO GROWTH TO DATE CULTURE WILL BE HELD FOR 5 DAYS BEFORE ISSUING A FINAL  NEGATIVE REPORT   Report Status PENDING   Incomplete  CULTURE, BLOOD (ROUTINE X 2)     Status: None   Collection Time    06/03/12  7:35 PM      Result Value Range Status   Specimen Description BLOOD RIGHT ARM   Final   Special Requests BOTTLES DRAWN AEROBIC AND ANAEROBIC   Final   Culture  Setup Time 06/03/2012 23:24   Final   Culture     Final   Value:        BLOOD CULTURE RECEIVED NO GROWTH TO DATE CULTURE WILL BE HELD FOR 5 DAYS BEFORE ISSUING A FINAL NEGATIVE REPORT   Report Status PENDING   Incomplete  URINE CULTURE     Status: None   Collection Time    06/03/12  8:21 PM      Result Value Range Status   Specimen Description URINE, CLEAN CATCH   Final   Special Requests NONE   Final   Culture  Setup Time 06/04/2012 02:48   Final   Colony Count >=100,000 COLONIES/ML   Final   Culture KLEBSIELLA OXYTOCA   Final   Report Status 06/06/2012 FINAL   Final   Organism ID, Bacteria KLEBSIELLA OXYTOCA   Final  MRSA PCR SCREENING     Status: None   Collection Time    06/04/12  2:25 AM      Result Value Range Status   MRSA by PCR NEGATIVE  NEGATIVE Final   Comment:            The GeneXpert MRSA Assay (FDA     approved for NASAL specimens     only), is one component of a     comprehensive MRSA colonization     surveillance program. It is not     intended to diagnose MRSA     infection nor to guide or     monitor treatment for     MRSA infections.  CLOSTRIDIUM DIFFICILE BY PCR     Status: None   Collection Time    06/04/12  6:02 AM      Result Value Range Status   C difficile by pcr NEGATIVE  NEGATIVE Final  STOOL CULTURE     Status: None   Collection Time    06/04/12  9:10 AM      Result Value Range Status   Specimen Description STOOL   Final   Special Requests NONE   Final   Culture NO SUSPICIOUS COLONIES, CONTINUING TO  HOLD   Final   Report Status PENDING   Incomplete  SURGICAL PCR SCREEN     Status: None   Collection Time    06/05/12  7:57 AM      Result Value  Range Status   MRSA, PCR NEGATIVE  NEGATIVE Final   Staphylococcus aureus NEGATIVE  NEGATIVE Final   Comment:            The Xpert SA Assay (FDA     approved for NASAL specimens     in patients over 34 years of age),     is one component of     a comprehensive surveillance     program.  Test performance has     been validated by [employer] for patients greater     than or equal to 34 year old.     It is not intended     to diagnose infection nor to     guide or monitor treatment.     Studies: No results found.  Scheduled Meds: . allopurinol  100 mg Oral Daily  . atorvastatin  10 mg Oral q1800  . diltiazem  240 mg Oral Daily  . fenofibrate  160 mg Oral Daily  . FLUoxetine  20 mg Oral BID  . insulin aspart  0-15 Units Subcutaneous TID WC  . insulin glargine  40 Units Subcutaneous BID  . levothyroxine  200 mcg Oral QAC breakfast  . pantoprazole  40 mg Oral Daily  . piperacillin-tazobactam (ZOSYN)  IV  3.375 g Intravenous Q8H  . potassium chloride  10 mEq Oral Daily  . rOPINIRole  2 mg Oral QHS  . sodium chloride  3 mL Intravenous Q12H  . tamsulosin  0.4 mg Oral Daily  . vancomycin  2,000 mg Intravenous Q24H  . Warfarin - Pharmacist Dosing Inpatient   Does not apply q1800   Continuous Infusions: . lactated ringers 50 mL/hr at 06/05/12 0981    Principal Problem:   Acute osteomyelitis, ankle and foot Active Problems:   DM (diabetes mellitus) type II controlled with renal manifestation   ANEMIA, PERNICIOUS   HYPERTENSION   BKA, RIGHT LEG   Atrial fibrillation   Hyponatremia   Hypokalemia   Acute on chronic renal failure    Time spent: 25 minutes.     Markan Cazarez  [facility] Pager [phone]. If 7PM-7AM, please contact night-coverage at [email or web address], password [facility] 06/07/2012, 1:27 PM  LOS: 4 days

## 2012-06-08 ENCOUNTER — Encounter (HOSPITAL_COMMUNITY): Payer: Self-pay | Admitting: Orthopedic Surgery

## 2012-06-08 LAB — BASIC METABOLIC PANEL
Chloride: 101 mEq/L (ref 96–112)
Creatinine, Ser: 1.26 mg/dL (ref 0.50–1.35)
GFR calc Af Amer: 64 mL/min — ABNORMAL LOW (ref >90)
GFR calc non Af Amer: 55 mL/min — ABNORMAL LOW (ref >90)
Potassium: 3.3 mEq/L — ABNORMAL LOW (ref 3.5–5.1)

## 2012-06-08 LAB — CBC
MCHC: 32.6 g/dL (ref 30.0–36.0)
Platelets: 221 10*3/uL (ref 150–400)
RDW: 15.2 % (ref 11.5–15.5)
WBC: 5.9 10*3/uL (ref 4.0–10.5)

## 2012-06-08 LAB — GLUCOSE, CAPILLARY
Glucose-Capillary: 60 mg/dL — ABNORMAL LOW (ref 70–99)
Glucose-Capillary: 117 mg/dL — ABNORMAL HIGH (ref 70–99)

## 2012-06-08 LAB — STOOL CULTURE

## 2012-06-08 LAB — PROTIME-INR: INR: 1.99 — ABNORMAL HIGH (ref 0.00–1.49)

## 2012-06-08 MED ORDER — INSULIN GLARGINE 100 UNIT/ML ~~LOC~~ SOLN
30.0000 [IU] | Freq: Two times a day (BID) | SUBCUTANEOUS | Status: DC
  Administered 2012-06-08 – 2012-06-11 (×6): 30 [IU] via SUBCUTANEOUS
  Filled 2012-06-08 (×9): qty 0.3

## 2012-06-08 MED ORDER — POTASSIUM CHLORIDE CRYS ER 20 MEQ PO TBCR
40.0000 meq | EXTENDED_RELEASE_TABLET | Freq: Once | ORAL | Status: AC
  Administered 2012-06-08: 40 meq via ORAL
  Filled 2012-06-08: qty 2

## 2012-06-08 MED ORDER — WARFARIN SODIUM 10 MG PO TABS
10.0000 mg | ORAL_TABLET | Freq: Once | ORAL | Status: AC
  Administered 2012-06-08: 10 mg via ORAL
  Filled 2012-06-08: qty 1

## 2012-06-08 NOTE — Clinical Documentation Improvement (Signed)
CKD DOCUMENTATION CLARIFICATION QUERY   THIS DOCUMENT IS NOT A PERMANENT PART OF THE MEDICAL RECORD  TO RESPOND TO THE THIS QUERY, FOLLOW THE INSTRUCTIONS BELOW:  1. If needed, update documentation for the patient's encounter via the notes activity.  2. Access this query again and click edit on the In [employer].  3. After updating, or not, click F2 to complete all highlighted (required) fields concerning your review. Select "additional documentation in the medical record" OR "no additional documentation provided".  4. Click Sign note button.  5. The deficiency will fall out of your In Basket *Please let us know if you are not able to complete this workflow by phone or e-mail (listed below).  Please update your documentation within the medical record to reflect your response to this query.                                                                                        06/08/12   Dear Valentina Shaggy Marton Redwood,  In a better effort to capture your patient's severity of illness, reflect appropriate length of stay and utilization of resources, a review of the patient medical record has revealed the following indicators.    Based on your clinical judgment, please clarify and document in a progress note and/or discharge summary the clinical condition associated with the following supporting information:  In responding to this query please exercise your independent judgment.  The fact that a query is asked, does not imply that any particular answer is desired or expected. Please consider clarification of  CKD or failure and specificity of stage of CKD Possible Clinical Conditions?   _______CKD Stage I -  GFR > OR = 90 _______CKD Stage II - GFR 60-80 _______CKD Stage III - GFR 30-59 _______CKD Stage IV - GFR 15-29 _______CKD Stage V - GFR < 15 _______ESRD (End Stage Renal Disease) _______Other condition_____________ _______Cannot Clinically determine   Supporting  Information:  Risk Factors: Acute  On chronic renal failure,  Hyponatremia,  DM II controlled with renal manifestations, HTN Signs & Symptoms:  Hyponatremia  - Secondary to pre renal etiology. Resolved with IV fluids. Lasix on hold.  Acute on chronic renal failure  -resolved with IV fluids. Cr peak to 1.4.  -Cr has decrease to 1.2    Calculated GFR: 06/03/12  Results 46L                               06/07/12               61L    Treatment:  You may use possible, probable, or suspect with inpatient documentation. possible, probable, suspected diagnoses MUST be documented at the time of discharge  Reviewed:  no additional documentation provided   Thank You,  Andy Gauss RN, BSN  Clinical Documentation Specialist:  Pager [phone] Office [phone] [place] Information Management [place]

## 2012-06-08 NOTE — Progress Notes (Signed)
Patient ID: [name], male   DOB: [birth year], 72 y.o.   MRN: [id number] Wound VAC removed. Patient's anterior ankle wound shows improved granulation tissue. Will plan for skin graft this week. Nursing to reapply wound VAC today.

## 2012-06-08 NOTE — Progress Notes (Signed)
pANTICOAGULATION CONSULT NOTE - Follow Up Consult  Pharmacy Consult for Coumadin Indication: atrial fibrillation  No Known Allergies  Patient Measurements: Height: 6' (182.9 cm) Weight: 311 lb 1.6 oz (141.114 kg) IBW/kg (Calculated) : 77.6 Heparin Dosing Weight:   Vital Signs: Temp: 97.5 F (36.4 C) (04/07 0609) BP: 142/68 mmHg (04/07 0609) Pulse Rate: 97 (04/07 0609)  Labs:  Recent Labs  06/05/12 1200 06/06/12 0446 06/07/12 0555 06/07/12 0818 06/08/12 0600  HGB 11.6* 10.8*  --   --  11.7*  HCT 34.5* 32.1*  --   --  35.9*  PLT 180 192  --   --  221  LABPROT  --  18.5* 19.1*  --  21.8*  INR  --  1.59* 1.66*  --  1.99*  CREATININE 1.15 1.22  --  1.15 1.26    Estimated Creatinine Clearance: 76.1 ml/min (by C-G formula based on Cr of 1.26).   Medications:  Scheduled:  . allopurinol  100 mg Oral Daily  . atorvastatin  10 mg Oral q1800  . diltiazem  240 mg Oral Daily  . fenofibrate  160 mg Oral Daily  . FLUoxetine  20 mg Oral BID  . insulin aspart  0-15 Units Subcutaneous TID WC  . insulin glargine  30 Units Subcutaneous BID  . levothyroxine  200 mcg Oral QAC breakfast  . pantoprazole  40 mg Oral Daily  . piperacillin-tazobactam (ZOSYN)  IV  3.375 g Intravenous Q8H  . potassium chloride  10 mEq Oral Daily  . [COMPLETED] potassium chloride  40 mEq Oral Once  . rOPINIRole  2 mg Oral QHS  . sodium chloride  3 mL Intravenous Q12H  . tamsulosin  0.4 mg Oral Daily  . vancomycin  2,000 mg Intravenous Q24H  . [COMPLETED] warfarin  10 mg Oral ONCE-1800  . Warfarin - Pharmacist Dosing Inpatient   Does not apply q1800  . [DISCONTINUED] insulin glargine  40 Units Subcutaneous BID  . [DISCONTINUED] polyethylene glycol  17 g Oral BID    Assessment: 72yo male with AFib,  INR moving toward goal, at 1.99 this AM.  No bleeding problems noted.    PTA dose 10mg  on MWF, 5mg  on TTSS  Goal of Therapy:  INR 2-3 Monitor platelets by anticoagulation protocol: Yes   Plan:  1.   Coumadin 10mg  2.  F/U in AM  Bayard Hugger, PharmD, BCPS  Clinical Pharmacist  Pager: [phone]

## 2012-06-08 NOTE — Progress Notes (Signed)
[facility] PROGRESS NOTE  [name]:[id number] DOB: April 05, 1940 DOA: 06/03/2012 PCP: Loreen Freud, DO  Assessment/Plan: 1- Left ankle with abscess ulceration and necrotic tendons/ early osteomyelitis:  -Continue with Vancomycin and Zosyn day 6.  -Patient s/p Excisional debridement of skin soft tissue and tendon and placement of a wound VAC by  4-4 by Dr Lajoyce Corners.  -no worsening pain.  -Patient has good granulation tissue per Dr Lajoyce Corners note. Plan is for skin graft this week.  -No culture sample was send for culture.   2-DM (diabetes mellitus) type II controlled with renal manifestation  - A1C > 11. Lantus 40 units BID. SSI.  -Likely no complaint with medications. CBG this am at 60. Will decrease lantus to 30 from 40 to  avoid hypoglycemia.   Hyponatremia  - Secondary to pre renal etiology. Resolved with IV fluids. Lasix on hold.  Acute on chronic renal failure  -resolved with IV fluids. Cr peak to 1.4.  -Cr has decrease to 1.2 -NSL ANEMIA, PERNICIOUS  -Hb stable. Monitor post surgery.  HYPERTENSION  Continue with Diltiazem.  BKA, RIGHT LEG  Atrial fibrillation  - Continue Diltiazem and Coumadin.  Hypokalemia  Replete with 40 meq times one.  UTI Klebsiella: Sensitive to zosyn day 6 antibiotics.    Code Status: Full Family Communication: Care discussed with patient.  Disposition Plan: to be determine. For skin graft this week.    Consultants:  Dr Lajoyce Corners  Procedures: Excisional debridement of skin soft tissue and tendon and placement of a wound VAC 4-4   Antibiotics:  Vancomycin 4-2  Zosyn 4-2  HPI/Subjective: Feeling well. Had 2 BM last night.  No significant pain left LE.   Objective: Filed Vitals:   06/07/12 1500 06/07/12 1700 06/07/12 2212 06/08/12 0609  BP: 114/67  110/59 142/68  Pulse:   103 97  Temp:  97.8 F (36.6 C) 97.8 F (36.6 C) 97.5 F (36.4 C)  TempSrc:  Oral    Resp:      Height:      Weight:      SpO2: 95%  96% 98%     Intake/Output Summary (Last 24 hours) at 06/08/12 0823 Last data filed at 06/08/12 0254  Gross per 24 hour  Intake    480 ml  Output    525 ml  Net    -45 ml   Filed Weights   06/03/12 2100 06/04/12 0034  Weight: 163.295 kg (360 lb) 141.114 kg (311 lb 1.6 oz)    Exam:   General:  No distress.  Cardiovascular: S 1, S 2 RRR  Respiratory: CTA  Abdomen: Bs present, soft, NT  Musculoskeletal:Right below knee amputation. Left LE with wound with dressing.   Data Reviewed: Basic Metabolic Panel:  Recent Labs Lab 06/03/12 1930 06/05/12 1200 06/06/12 0446 06/07/12 0818 06/08/12 0600  NA 130* 141 139 138 140  K 3.4* 3.7 3.1* 3.5 3.3*  CL 93* 104 103 101 101  CO2 25 30 28 28 28   GLUCOSE 357* 188* 85 118* 70  BUN 31* 18 15 10 8   CREATININE 1.47* 1.15 1.22 1.15 1.26  CALCIUM 9.2 9.0 8.9 9.1 9.1   Liver Function Tests:  Recent Labs Lab 06/03/12 1930  AST 12  ALT 11  ALKPHOS 52  BILITOT 0.5  PROT 7.3  ALBUMIN 2.9*   No results found for this basename: LIPASE, AMYLASE,  in the last 168 hours No results found for this basename: AMMONIA,  in the last 168 hours CBC:  Recent Labs Lab 06/03/12 1930 06/05/12 1200 06/06/12 0446 06/08/12 0600  WBC 9.6 5.7 6.1 5.9  NEUTROABS 7.9*  --   --   --   HGB 11.3* 11.6* 10.8* 11.7*  HCT 33.0* 34.5* 32.1* 35.9*  MCV 87.1 85.4 87.5 88.9  PLT 206 180 192 221   Cardiac Enzymes: No results found for this basename: CKTOTAL, CKMB, CKMBINDEX, TROPONINI,  in the last 168 hours BNP (last 3 results) No results found for this basename: PROBNP,  in the last 8760 hours CBG:  Recent Labs Lab 06/07/12 0653 06/07/12 1123 06/07/12 1616 06/07/12 2240 06/08/12 0646  GLUCAP 70 111* 133* 103* 60*    Recent Results (from the past 240 hour(s))  CULTURE, BLOOD (ROUTINE X 2)     Status: None   Collection Time    06/03/12  7:30 PM      Result Value Range Status   Specimen Description BLOOD RIGHT ARM   Final   Special Requests  BOTTLES DRAWN AEROBIC AND ANAEROBIC   Final   Culture  Setup Time 06/03/2012 23:27   Final   Culture     Final   Value:        BLOOD CULTURE RECEIVED NO GROWTH TO DATE CULTURE WILL BE HELD FOR 5 DAYS BEFORE ISSUING A FINAL NEGATIVE REPORT   Report Status PENDING   Incomplete  CULTURE, BLOOD (ROUTINE X 2)     Status: None   Collection Time    06/03/12  7:35 PM      Result Value Range Status   Specimen Description BLOOD RIGHT ARM   Final   Special Requests BOTTLES DRAWN AEROBIC AND ANAEROBIC   Final   Culture  Setup Time 06/03/2012 23:24   Final   Culture     Final   Value:        BLOOD CULTURE RECEIVED NO GROWTH TO DATE CULTURE WILL BE HELD FOR 5 DAYS BEFORE ISSUING A FINAL NEGATIVE REPORT   Report Status PENDING   Incomplete  URINE CULTURE     Status: None   Collection Time    06/03/12  8:21 PM      Result Value Range Status   Specimen Description URINE, CLEAN CATCH   Final   Special Requests NONE   Final   Culture  Setup Time 06/04/2012 02:48   Final   Colony Count >=100,000 COLONIES/ML   Final   Culture KLEBSIELLA OXYTOCA   Final   Report Status 06/06/2012 FINAL   Final   Organism ID, Bacteria KLEBSIELLA OXYTOCA   Final  MRSA PCR SCREENING     Status: None   Collection Time    06/04/12  2:25 AM      Result Value Range Status   MRSA by PCR NEGATIVE  NEGATIVE Final   Comment:            The GeneXpert MRSA Assay (FDA     approved for NASAL specimens     only), is one component of a     comprehensive MRSA colonization     surveillance program. It is not     intended to diagnose MRSA     infection nor to guide or     monitor treatment for     MRSA infections.  CLOSTRIDIUM DIFFICILE BY PCR     Status: None   Collection Time    06/04/12  6:02 AM      Result Value Range Status   C difficile by pcr  NEGATIVE  NEGATIVE Final  STOOL CULTURE     Status: None   Collection Time    06/04/12  9:10 AM      Result Value Range Status   Specimen Description STOOL   Final    Special Requests NONE   Final   Culture NO SUSPICIOUS COLONIES, CONTINUING TO HOLD   Final   Report Status PENDING   Incomplete  SURGICAL PCR SCREEN     Status: None   Collection Time    06/05/12  7:57 AM      Result Value Range Status   MRSA, PCR NEGATIVE  NEGATIVE Final   Staphylococcus aureus NEGATIVE  NEGATIVE Final   Comment:            The Xpert SA Assay (FDA     approved for NASAL specimens     in patients over 39 years of age),     is one component of     a comprehensive surveillance     program.  Test performance has     been validated by [employer] for patients greater     than or equal to 41 year old.     It is not intended     to diagnose infection nor to     guide or monitor treatment.     Studies: No results found.  Scheduled Meds: . allopurinol  100 mg Oral Daily  . atorvastatin  10 mg Oral q1800  . diltiazem  240 mg Oral Daily  . fenofibrate  160 mg Oral Daily  . FLUoxetine  20 mg Oral BID  . insulin aspart  0-15 Units Subcutaneous TID WC  . insulin glargine  40 Units Subcutaneous BID  . levothyroxine  200 mcg Oral QAC breakfast  . pantoprazole  40 mg Oral Daily  . piperacillin-tazobactam (ZOSYN)  IV  3.375 g Intravenous Q8H  . potassium chloride  10 mEq Oral Daily  . potassium chloride  40 mEq Oral Once  . rOPINIRole  2 mg Oral QHS  . sodium chloride  3 mL Intravenous Q12H  . tamsulosin  0.4 mg Oral Daily  . vancomycin  2,000 mg Intravenous Q24H  . Warfarin - Pharmacist Dosing Inpatient   Does not apply q1800   Continuous Infusions: . lactated ringers 50 mL/hr at 06/05/12 4098    Principal Problem:   Acute osteomyelitis, ankle and foot Active Problems:   DM (diabetes mellitus) type II controlled with renal manifestation   ANEMIA, PERNICIOUS   HYPERTENSION   BKA, RIGHT LEG   Atrial fibrillation   Hyponatremia   Hypokalemia   Acute on chronic renal failure    Time spent: 25 minutes.     [name]  [facility] Pager  [phone]. If 7PM-7AM, please contact night-coverage at [email or web address], password [facility] 06/08/2012, 8:23 AM  LOS: 5 days

## 2012-06-09 DIAGNOSIS — D649 Anemia, unspecified: Secondary | ICD-10-CM

## 2012-06-09 DIAGNOSIS — L97309 Non-pressure chronic ulcer of unspecified ankle with unspecified severity: Secondary | ICD-10-CM

## 2012-06-09 DIAGNOSIS — E1129 Type 2 diabetes mellitus with other diabetic kidney complication: Secondary | ICD-10-CM

## 2012-06-09 LAB — GLUCOSE, CAPILLARY
Glucose-Capillary: 73 mg/dL (ref 70–99)
Glucose-Capillary: 111 mg/dL — ABNORMAL HIGH (ref 70–99)
Glucose-Capillary: 160 mg/dL — ABNORMAL HIGH (ref 70–99)

## 2012-06-09 LAB — CULTURE, BLOOD (ROUTINE X 2): Culture: NO GROWTH

## 2012-06-09 LAB — BASIC METABOLIC PANEL
BUN: 8 mg/dL (ref 6–23)
Chloride: 104 mEq/L (ref 96–112)
GFR calc Af Amer: 56 mL/min — ABNORMAL LOW (ref >90)
GFR calc non Af Amer: 49 mL/min — ABNORMAL LOW (ref >90)
Potassium: 4.2 mEq/L (ref 3.5–5.1)
Sodium: 141 mEq/L (ref 135–145)

## 2012-06-09 LAB — PROTIME-INR: INR: 2.33 — ABNORMAL HIGH (ref 0.00–1.49)

## 2012-06-09 MED ORDER — WARFARIN SODIUM 5 MG PO TABS
5.0000 mg | ORAL_TABLET | Freq: Once | ORAL | Status: AC
  Administered 2012-06-09: 5 mg via ORAL
  Filled 2012-06-09: qty 1

## 2012-06-09 NOTE — Progress Notes (Signed)
pANTICOAGULATION CONSULT NOTE - Follow Up Consult  Pharmacy Consult for Coumadin Indication: atrial fibrillation  No Known Allergies  Patient Measurements: Height: 6' (182.9 cm) Weight: 311 lb 1.6 oz (141.114 kg) IBW/kg (Calculated) : 77.6  Vital Signs: Temp: 96.4 F (35.8 C) (04/08 0825) Temp src: Oral (04/08 0825) BP: 112/74 mmHg (04/08 1009) Pulse Rate: 119 (04/08 0825)  Labs:  Recent Labs  06/07/12 0555 06/07/12 0818 06/08/12 0600 06/09/12 0445 06/09/12 0852  HGB  --   --  11.7*  --   --   HCT  --   --  35.9*  --   --   PLT  --   --  221  --   --   LABPROT 19.1*  --  21.8* 24.5*  --   INR 1.66*  --  1.99* 2.33*  --   CREATININE  --  1.15 1.26  --  1.39*    Estimated Creatinine Clearance: 69 ml/min (by C-G formula based on Cr of 1.39).   Medications:  Scheduled:  . allopurinol  100 mg Oral Daily  . atorvastatin  10 mg Oral q1800  . diltiazem  240 mg Oral Daily  . fenofibrate  160 mg Oral Daily  . FLUoxetine  20 mg Oral BID  . insulin aspart  0-15 Units Subcutaneous TID WC  . insulin glargine  30 Units Subcutaneous BID  . levothyroxine  200 mcg Oral QAC breakfast  . pantoprazole  40 mg Oral Daily  . piperacillin-tazobactam (ZOSYN)  IV  3.375 g Intravenous Q8H  . potassium chloride  10 mEq Oral Daily  . rOPINIRole  2 mg Oral QHS  . sodium chloride  3 mL Intravenous Q12H  . tamsulosin  0.4 mg Oral Daily  . vancomycin  2,000 mg Intravenous Q24H  . [COMPLETED] warfarin  10 mg Oral ONCE-1800  . Warfarin - Pharmacist Dosing Inpatient   Does not apply q1800    Assessment: 72yo male with AFib, INR (2.33) is at goal this AM.  No bleeding problems noted.    PTA dose 10mg  on MWF, 5mg  on TTSS  Goal of Therapy:  INR 2-3 Monitor platelets by anticoagulation protocol: Yes   Plan:  1.  Coumadin 5 mg 2.  F/U in AM  Bayard Hugger, PharmD, BCPS  Clinical Pharmacist  Pager: [phone]

## 2012-06-09 NOTE — Progress Notes (Signed)
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 He had an MRI that show tenosynovitis, abscess formation anterior ankle, potentially early osteomyelitis of middle cuneiforms, Chronic achilles tendinosis. He is S/P Excisional debridement of skin soft tissue and tendon and placement of a wound VAC  4-4 by Dr Lajoyce Corners. He is  on IV Vancomycin and Zosyn. Today is day 7 of antibiotics. Plan is for skin graft this week per Dr Lajoyce Corners note.    1- Left ankle with abscess ulceration and necrotic tendons/ early osteomyelitis:  -Continue with Vancomycin and Zosyn day 7.  -Patient s/p Excisional debridement of skin soft tissue and tendon and placement of a wound VAC by  4-4 by Dr Lajoyce Corners.  -no worsening pain.  -Patient has good granulation tissue per Dr Lajoyce Corners note. Plan is for skin graft this week.  -No culture sample was send for culture.   2-DM (diabetes mellitus) type II controlled with renal manifestation  - A1C > 11. Lantus 40 units BID. SSI.  -Likely no complaint with medications. Patient had hypoglycemia episode 4-7. lantus was decrease to 30 from 40 to  avoid hypoglycemia. Continue to monitor CBG. Adjust insulin as needed.   Hyponatremia  - Secondary to pre renal etiology. Resolved with IV fluids. Lasix on hold.  Acute on chronic renal failure  -resolved with IV fluids. Cr peak to 1.4.  -Cr has decrease to 1.2 -NSL ANEMIA, PERNICIOUS  -Hb stable. Monitor post surgery.  HYPERTENSION  Continue with Diltiazem.  BKA, RIGHT LEG  Atrial fibrillation  - Continue Diltiazem and Coumadin.  Hypokalemia  B-met pending for this morning.  UTI Klebsiella: Sensitive to zosyn day 7 antibiotics.   Diarrhea: reported loose BM. Improving.   Code Status: Full Family Communication: Care discussed with patient.  Disposition Plan: to be determine. For skin graft this week.    Consultants:  Dr Lajoyce Corners  Procedures: Excisional debridement of skin soft tissue and tendon and placement of a wound VAC 4-4   Antibiotics:  Vancomycin 4-2  Zosyn 4-2  HPI/Subjective: No significant diarrhea. Feeling well.   Objective: Filed Vitals:   06/08/12 1625 06/08/12 2123 06/09/12 0533 06/09/12 0825  BP: 113/48 132/85 126/72 114/68  Pulse: 102 95 100 119  Temp: 97.9 F (36.6 C) 97.4 F (36.3 C) 97.4 F (36.3 C) 96.4 F (35.8 C)  TempSrc: Oral Oral Oral Oral  Resp: 18 16 18 18   Height:      Weight:      SpO2: 98% 96% 95% 99%    Intake/Output Summary (Last 24 hours) at 06/09/12 0829 Last data filed at 06/09/12 0500  Gross per 24 hour  Intake    290 ml  Output   2050 ml  Net  -1760 ml   Filed Weights   06/03/12 2100 06/04/12 0034  Weight: 163.295 kg (360 lb) 141.114 kg (311 lb 1.6 oz)    Exam:   General:  No distress.  Cardiovascular: S 1, S 2 RRR  Respiratory: CTA  Abdomen: Bs present, soft, NT  Musculoskeletal:Right below knee amputation. Left LE with wound with dressing.   Data Reviewed: Basic Metabolic Panel:  Recent Labs Lab 06/03/12 1930 06/05/12 1200 06/06/12 0446 06/07/12 0818 06/08/12 0600  NA 130* 141 139  138 140  K 3.4* 3.7 3.1* 3.5 3.3*  CL 93* 104 103 101 101  CO2 25 30 28 28 28   GLUCOSE 357* 188* 85 118* 70  BUN 31* 18 15 10 8   CREATININE 1.47* 1.15 1.22 1.15 1.26  CALCIUM 9.2 9.0 8.9 9.1 9.1   Liver Function Tests:  Recent Labs Lab 06/03/12 1930  AST 12  ALT 11  ALKPHOS 52  BILITOT 0.5  PROT 7.3  ALBUMIN 2.9*   CBC:  Recent Labs Lab 06/03/12 1930 06/05/12 1200 06/06/12 0446 06/08/12 0600  WBC 9.6 5.7 6.1 5.9  NEUTROABS 7.9*  --   --   --   HGB 11.3* 11.6* 10.8* 11.7*  HCT 33.0* 34.5* 32.1* 35.9*  MCV 87.1 85.4 87.5  88.9  PLT 206 180 192 221   Cardiac Enzymes: No results found for this basename: CKTOTAL, CKMB, CKMBINDEX, TROPONINI,  in the last 168 hours BNP (last 3 results) No results found for this basename: PROBNP,  in the last 8760 hours CBG:  Recent Labs Lab 06/07/12 2240 06/08/12 0646 06/08/12 1118 06/08/12 1620 06/08/12 2207  GLUCAP 103* 60* 117* 102* 130*    Recent Results (from the past 240 hour(s))  CULTURE, BLOOD (ROUTINE X 2)     Status: None   Collection Time    06/03/12  7:30 PM      Result Value Range Status   Specimen Description BLOOD RIGHT ARM   Final   Special Requests BOTTLES DRAWN AEROBIC AND ANAEROBIC   Final   Culture  Setup Time 06/03/2012 23:27   Final   Culture NO GROWTH 5 DAYS   Final   Report Status 06/09/2012 FINAL   Final  CULTURE, BLOOD (ROUTINE X 2)     Status: None   Collection Time    06/03/12  7:35 PM      Result Value Range Status   Specimen Description BLOOD RIGHT ARM   Final   Special Requests BOTTLES DRAWN AEROBIC AND ANAEROBIC   Final   Culture  Setup Time 06/03/2012 23:24   Final   Culture NO GROWTH 5 DAYS   Final   Report Status 06/09/2012 FINAL   Final  URINE CULTURE     Status: None   Collection Time    06/03/12  8:21 PM      Result Value Range Status   Specimen Description URINE, CLEAN CATCH   Final   Special Requests NONE   Final   Culture  Setup Time 06/04/2012 02:48   Final   Colony Count >=100,000 COLONIES/ML   Final   Culture KLEBSIELLA OXYTOCA   Final   Report Status 06/06/2012 FINAL   Final   Organism ID, Bacteria KLEBSIELLA OXYTOCA   Final  MRSA PCR SCREENING     Status: None   Collection Time    06/04/12  2:25 AM      Result Value Range Status   MRSA by PCR NEGATIVE  NEGATIVE Final   Comment:            The GeneXpert MRSA Assay (FDA     approved for NASAL specimens     only), is one component of a     comprehensive MRSA colonization     surveillance program. It is not     intended to diagnose MRSA      infection nor to guide or     monitor treatment for     MRSA infections.  CLOSTRIDIUM DIFFICILE BY PCR  Status: None   Collection Time    06/04/12  6:02 AM      Result Value Range Status   C difficile by pcr NEGATIVE  NEGATIVE Final  STOOL CULTURE     Status: None   Collection Time    06/04/12  9:10 AM      Result Value Range Status   Specimen Description STOOL   Final   Special Requests NONE   Final   Culture     Final   Value: NO SALMONELLA, SHIGELLA, CAMPYLOBACTER, YERSINIA, OR E.COLI 0157:H7 ISOLATED   Report Status 06/08/2012 FINAL   Final  SURGICAL PCR SCREEN     Status: None   Collection Time    06/05/12  7:57 AM      Result Value Range Status   MRSA, PCR NEGATIVE  NEGATIVE Final   Staphylococcus aureus NEGATIVE  NEGATIVE Final   Comment:            The Xpert SA Assay (FDA     approved for NASAL specimens     in patients over 59 years of age),     is one component of     a comprehensive surveillance     program.  Test performance has     been validated by [employer] for patients greater     than or equal to 12 year old.     It is not intended     to diagnose infection nor to     guide or monitor treatment.     Studies: No results found.  Scheduled Meds: . allopurinol  100 mg Oral Daily  . atorvastatin  10 mg Oral q1800  . diltiazem  240 mg Oral Daily  . fenofibrate  160 mg Oral Daily  . FLUoxetine  20 mg Oral BID  . insulin aspart  0-15 Units Subcutaneous TID WC  . insulin glargine  30 Units Subcutaneous BID  . levothyroxine  200 mcg Oral QAC breakfast  . pantoprazole  40 mg Oral Daily  . piperacillin-tazobactam (ZOSYN)  IV  3.375 g Intravenous Q8H  . potassium chloride  10 mEq Oral Daily  . rOPINIRole  2 mg Oral QHS  . sodium chloride  3 mL Intravenous Q12H  . tamsulosin  0.4 mg Oral Daily  . vancomycin  2,000 mg Intravenous Q24H  . Warfarin - Pharmacist Dosing Inpatient   Does not apply q1800   Continuous Infusions: . lactated ringers 50  mL/hr at 06/09/12 0256    Principal Problem:   Acute osteomyelitis, ankle and foot Active Problems:   DM (diabetes mellitus) type II controlled with renal manifestation   ANEMIA, PERNICIOUS   HYPERTENSION   BKA, RIGHT LEG   Atrial fibrillation   Hyponatremia   Hypokalemia   Acute on chronic renal failure    Time spent: 25 minutes.     Hokulani Rogel  [facility] Pager [phone]. If 7PM-7AM, please contact night-coverage at [email or web address], password [facility] 06/09/2012, 8:29 AM  LOS: 6 days

## 2012-06-10 ENCOUNTER — Encounter (HOSPITAL_COMMUNITY)
Admission: EM | Disposition: A | Discharge status: Home / Self Care | Payer: Self-pay | Source: Home / Self Care | Attending: Internal Medicine

## 2012-06-10 ENCOUNTER — Encounter (HOSPITAL_COMMUNITY): Payer: Self-pay | Admitting: Anesthesiology

## 2012-06-10 ENCOUNTER — Inpatient Hospital Stay (HOSPITAL_COMMUNITY): Payer: Medicare Other | Admitting: Anesthesiology

## 2012-06-10 DIAGNOSIS — D510 Vitamin B12 deficiency anemia due to intrinsic factor deficiency: Secondary | ICD-10-CM

## 2012-06-10 HISTORY — PX: APLIGRAFT PLACEMENT: SHX5228

## 2012-06-10 HISTORY — PX: APPLICATION OF WOUND VAC: SHX5189

## 2012-06-10 LAB — CBC
HCT: 36.5 % — ABNORMAL LOW (ref 39.0–52.0)
Hemoglobin: 12.1 g/dL — ABNORMAL LOW (ref 13.0–17.0)
MCH: 29.4 pg (ref 26.0–34.0)
MCHC: 33.2 g/dL (ref 30.0–36.0)
MCV: 88.6 fL (ref 78.0–100.0)

## 2012-06-10 LAB — COMPREHENSIVE METABOLIC PANEL
ALT: 11 U/L (ref 0–53)
AST: 22 U/L (ref 0–37)
CO2: 30 mEq/L (ref 19–32)
Calcium: 9.5 mg/dL (ref 8.4–10.5)
GFR calc non Af Amer: 47 mL/min — ABNORMAL LOW (ref >90)
Sodium: 141 mEq/L (ref 135–145)
Total Protein: 7.2 g/dL (ref 6.0–8.3)

## 2012-06-10 LAB — CREATININE, SERUM: Creatinine, Ser: 1.45 mg/dL — ABNORMAL HIGH (ref 0.50–1.35)

## 2012-06-10 SURGERY — MINOR APLIGRAFT APLICATION
Anesthesia: General | Site: Leg Lower | Laterality: Left | Wound class: Contaminated

## 2012-06-10 MED ORDER — LACTATED RINGERS IV SOLN
INTRAVENOUS | Status: DC | PRN
  Administered 2012-06-10: 10:00:00 via INTRAVENOUS

## 2012-06-10 MED ORDER — ONDANSETRON HCL 4 MG/2ML IJ SOLN: 4.0000 mg | Freq: Four times a day (QID) | INTRAMUSCULAR | Status: DC | PRN

## 2012-06-10 MED ORDER — LIDOCAINE HCL (CARDIAC) 20 MG/ML IV SOLN
INTRAVENOUS | Status: DC | PRN
  Administered 2012-06-10: 50 mg via INTRAVENOUS

## 2012-06-10 MED ORDER — ONDANSETRON HCL 4 MG/2ML IJ SOLN
INTRAMUSCULAR | Status: DC | PRN
  Administered 2012-06-10: 4 mg via INTRAVENOUS

## 2012-06-10 MED ORDER — OXYCODONE HCL 5 MG/5ML PO SOLN: 5.0000 mg | Freq: Once | ORAL | Status: DC | PRN

## 2012-06-10 MED ORDER — WARFARIN SODIUM 5 MG PO TABS
5.0000 mg | ORAL_TABLET | ORAL | Status: DC
  Filled 2012-06-10: qty 1

## 2012-06-10 MED ORDER — SUCCINYLCHOLINE CHLORIDE 20 MG/ML IJ SOLN
INTRAMUSCULAR | Status: DC | PRN
  Administered 2012-06-10: 120 mg via INTRAVENOUS

## 2012-06-10 MED ORDER — METOCLOPRAMIDE HCL 10 MG PO TABS: 5.0000 mg | ORAL_TABLET | Freq: Three times a day (TID) | ORAL | Status: DC | PRN

## 2012-06-10 MED ORDER — ONDANSETRON HCL 4 MG PO TABS: 4.0000 mg | ORAL_TABLET | Freq: Four times a day (QID) | ORAL | Status: DC | PRN

## 2012-06-10 MED ORDER — OXYCODONE HCL 5 MG PO TABS: 5.0000 mg | ORAL_TABLET | Freq: Once | ORAL | Status: DC | PRN

## 2012-06-10 MED ORDER — SODIUM CHLORIDE 0.9 % IR SOLN
Status: DC | PRN
  Administered 2012-06-10: 1000 mL

## 2012-06-10 MED ORDER — FENTANYL CITRATE 0.05 MG/ML IJ SOLN: 50.0000 ug | INTRAMUSCULAR | Status: DC | PRN

## 2012-06-10 MED ORDER — FENTANYL CITRATE 0.05 MG/ML IJ SOLN
INTRAMUSCULAR | Status: DC | PRN
  Administered 2012-06-10: 50 ug via INTRAVENOUS

## 2012-06-10 MED ORDER — PROPOFOL 10 MG/ML IV BOLUS
INTRAVENOUS | Status: DC | PRN
  Administered 2012-06-10: 100 mg via INTRAVENOUS

## 2012-06-10 MED ORDER — MIDAZOLAM HCL 2 MG/2ML IJ SOLN: 0.5000 mg | INTRAMUSCULAR | Status: DC | PRN

## 2012-06-10 MED ORDER — METOCLOPRAMIDE HCL 5 MG/ML IJ SOLN: 5.0000 mg | Freq: Three times a day (TID) | INTRAMUSCULAR | Status: DC | PRN

## 2012-06-10 MED ORDER — WARFARIN SODIUM 10 MG PO TABS
10.0000 mg | ORAL_TABLET | ORAL | Status: AC
  Administered 2012-06-10: 10 mg via ORAL
  Filled 2012-06-10 (×2): qty 1

## 2012-06-10 MED ORDER — FENTANYL CITRATE 0.05 MG/ML IJ SOLN: 25.0000 ug | INTRAMUSCULAR | Status: DC | PRN

## 2012-06-10 SURGICAL SUPPLY — 37 items
BNDG COHESIVE 6X5 TAN STRL LF (GAUZE/BANDAGES/DRESSINGS) ×2 IMPLANT
BNDG GAUZE STRTCH 6 (GAUZE/BANDAGES/DRESSINGS) ×3 IMPLANT
CANISTER WOUND CARE 500ML ATS (WOUND CARE) ×1 IMPLANT
CLOTH BEACON ORANGE TIMEOUT ST (SAFETY) ×2 IMPLANT
COTTON STERILE ROLL (GAUZE/BANDAGES/DRESSINGS) ×1 IMPLANT
COVER SURGICAL LIGHT HANDLE (MISCELLANEOUS) ×2 IMPLANT
DRAPE INCISE IOBAN 66X45 STRL (DRAPES) ×1 IMPLANT
DRAPE U-SHAPE 47X51 STRL (DRAPES) ×2 IMPLANT
DRSG ADAPTIC 3X8 NADH LF (GAUZE/BANDAGES/DRESSINGS) ×1 IMPLANT
DRSG MEPITEL 4X7.2 (GAUZE/BANDAGES/DRESSINGS) ×2 IMPLANT
DRSG VAC ATS SM SENSATRAC (GAUZE/BANDAGES/DRESSINGS) ×2 IMPLANT
DURAPREP 26ML APPLICATOR (WOUND CARE) ×1 IMPLANT
GLOVE BIOGEL PI IND STRL 7.0 (GLOVE) IMPLANT
GLOVE BIOGEL PI IND STRL 9 (GLOVE) ×1 IMPLANT
GLOVE BIOGEL PI INDICATOR 7.0 (GLOVE) ×2
GLOVE BIOGEL PI INDICATOR 9 (GLOVE) ×1
GLOVE SURG ORTHO 9.0 STRL STRW (GLOVE) ×2 IMPLANT
GLOVE SURG SS PI 6.5 STRL IVOR (GLOVE) ×1 IMPLANT
GOWN PREVENTION PLUS XLARGE (GOWN DISPOSABLE) ×2 IMPLANT
GOWN SRG XL XLNG 56XLVL 4 (GOWN DISPOSABLE) ×1 IMPLANT
GOWN STRL NON-REIN XL XLG LVL4 (GOWN DISPOSABLE) ×2
KIT BASIN OR (CUSTOM PROCEDURE TRAY) ×2 IMPLANT
KIT ROOM TURNOVER OR (KITS) ×2 IMPLANT
NS IRRIG 1000ML POUR BTL (IV SOLUTION) ×2 IMPLANT
PACK ORTHO EXTREMITY (CUSTOM PROCEDURE TRAY) ×2 IMPLANT
PAD ARMBOARD 7.5X6 YLW CONV (MISCELLANEOUS) ×3 IMPLANT
PADDING CAST COTTON 6X4 STRL (CAST SUPPLIES) ×1 IMPLANT
SPONGE GAUZE 4X4 12PLY (GAUZE/BANDAGES/DRESSINGS) ×1 IMPLANT
SPONGE LAP 18X18 X RAY DECT (DISPOSABLE) ×2 IMPLANT
STAPLER VISISTAT 35W (STAPLE) ×1 IMPLANT
TOWEL OR 17X24 6PK STRL BLUE (TOWEL DISPOSABLE) ×2 IMPLANT
TOWEL OR 17X26 10 PK STRL BLUE (TOWEL DISPOSABLE) ×2 IMPLANT
TUBE ANAEROBIC SPECIMEN COL (MISCELLANEOUS) IMPLANT
Theraskin (Graft) ×2 IMPLANT
Thereskin (Graft) ×1 IMPLANT
UNDERPAD 30X30 INCONTINENT (UNDERPADS AND DIAPERS) ×2 IMPLANT
WATER STERILE IRR 1000ML POUR (IV SOLUTION) ×2 IMPLANT

## 2012-06-10 NOTE — Progress Notes (Signed)
pANTICOAGULATION CONSULT NOTE - Follow Up Consult  Pharmacy Consult for Coumadin and vancomycin/zosyn  Indication: atrial fibrillation; L ankle abscess/osteomyelitis  No Known Allergies  Patient Measurements: Height: 6' (182.9 cm) Weight: 311 lb 1.6 oz (141.114 kg) IBW/kg (Calculated) : 77.6  Vital Signs: Temp: 97.8 F (36.6 C) (04/09 1203) BP: 130/76 mmHg (04/09 1203) Pulse Rate: 95 (04/09 1203)  Labs:  Recent Labs  06/08/12 0600 06/09/12 0445 06/09/12 0852 06/10/12 0525 06/10/12 0855  HGB 11.7*  --   --   --  12.1*  HCT 35.9*  --   --   --  36.5*  PLT 221  --   --   --  212  LABPROT 21.8* 24.5*  --  26.3*  --   INR 1.99* 2.33*  --  2.56*  --   CREATININE 1.26  --  1.39* 1.45* 1.43*    Estimated Creatinine Clearance: 67 ml/min (by C-G formula based on Cr of 1.43).   Assessment: 72yo male on coumadin PTA for AFib, INR (2.56). He is s/p skin graft and placement of wound VAC to L ankle today. No bleeding problems noted.    PTA dose 10mg  on MWF, 5mg  on TTSS  Abx: today is day #7 of vancomycin and zosyn for L ankle abscess and necrotic tendons/early osteomyelitis.  He is s/p skin graft and wound VAC placement in ankle this morning.  His WBC is 6.7, His creat is trending upward 1.26<1.39<1.45< 1.43 (done x 2 this am).  Creat cl ~ 67 ml/min.  He is afebrile.  He is to go to SNF on Friday morning. I texted [facility] 6 to see if abx will continue at SNF.  If so, then we will need to check a vancomycin trough level.   Goal of Therapy:  INR 2-3   Plan:  1.  Resume home coumadin dose of 10 mg MWF and 5 mg TTSS.  Continue daily INR for now.  2. Continue zosyn 3.375 q8h, 4 hr infusion rate  3. Continue vancomycin 2000 mg IV q24 4. Check vancomycin trough if abx are to continue at [facility], Pharm.D. [phone] 06/10/2012 12:36 PM

## 2012-06-10 NOTE — Anesthesia Preprocedure Evaluation (Addendum)
Anesthesia Evaluation  Patient identified by MRN, date of birth, ID band Patient awake    Reviewed: Allergy & Precautions, H&P , NPO status , Patient's Chart, lab work & pertinent test results  Airway Mallampati: II  Neck ROM: full    Dental  (+) Dental Advidsory Given, Edentulous Lower and Edentulous Upper   Pulmonary sleep apnea , former smoker,          Cardiovascular hypertension, +CHF + dysrhythmias Atrial Fibrillation     Neuro/Psych Depression    GI/Hepatic   Endo/Other  diabetes, Type 2Hypothyroidism Morbid obesity  Renal/GU Renal InsufficiencyRenal disease     Musculoskeletal  (+) Arthritis -, Osteoarthritis,    Abdominal   Peds  Hematology   Anesthesia Other Findings   Reproductive/Obstetrics                          Anesthesia Physical Anesthesia Plan  ASA: IV  Anesthesia Plan: General   Post-op Pain Management:    Induction: Intravenous  Airway Management Planned: LMA  Additional Equipment:   Intra-op Plan:   Post-operative Plan:   Informed Consent: I have reviewed the patients History and Physical, chart, labs and discussed the procedure including the risks, benefits and alternatives for the proposed anesthesia with the patient or authorized representative who has indicated his/her understanding and acceptance.   Dental Advisory Given  Plan Discussed with: CRNA and Surgeon  Anesthesia Plan Comments:        Anesthesia Quick Evaluation

## 2012-06-10 NOTE — Progress Notes (Signed)
Patient ID: [name], male   DOB: 05-Jul-1940, 72 y.o.   MRN: [id number] Plan for application of skin graft this morning left ankle

## 2012-06-10 NOTE — Progress Notes (Signed)
Split thickness skin graft and wound VAC applied this morning. Patient may be discharged to skilled nursing after Friday morning.

## 2012-06-10 NOTE — Preoperative (Signed)
Beta Blockers   Reason not to administer Beta Blockers:Not Applicable 

## 2012-06-10 NOTE — Op Note (Signed)
OPERATIVE REPORT  DATE OF SURGERY: 06/10/2012  PATIENT:  Doyne Keel,  72 y.o. male  PRE-OPERATIVE DIAGNOSIS:  Left ankle ulcer  POST-OPERATIVE DIAGNOSIS:  Left ankle ulcer  PROCEDURE:  Procedure(s): Left ankle skin graft APPLICATION OF WOUND VAC Allograft split thickness skin graft 6 x 5 cm Excisional debridement skin soft tissue muscle and tendon.  SURGEON:  Surgeon(s): Nadara Mustard, MD  ANESTHESIA:   general  EBL:  Minimal ML  SPECIMEN:  No Specimen  TOURNIQUET:  * No tourniquets in log *  PROCEDURE DETAILS: Patient is a 72 year old gentleman diabetic status post amputation of the right lower extremity who presents with a massive ulcer left lower extremity over the ankle. Patient has undergone initial irrigation and debridement and decompression of the abscess and placement of a wound VAC. Patient presents at this time for further debridement and placement of split thickness skin graft. Risks and benefits were discussed including infection neurovascular injury nonhealing of the graft need for potential amputation. Patient states he understands and wished to proceed at this time. Description of procedure patient was brought to the operating room and underwent a general anesthetic. After adequate levels of anesthesia were obtained patient's left lower extremity was prepped using Betadine paint and draped into a sterile field. A 10 blade knife was used to debris the skin soft tissue muscle and tendon with excisional debridement. This was treated down to bleeding viable healthy granulation tissue. 3 split thickness allograft skin graft was prepared and the back table 3 sheaths were prepared and this was secured with staples to the wound. A Mepitel dressing was applied followed by a wound VAC. This had a good suction fit patient was extubated taken to the PACU in stable condition.  PLAN OF CARE: Admit to inpatient   PATIENT DISPOSITION:  PACU - hemodynamically stable.    Nadara Mustard, MD 06/10/2012 10:25 AM

## 2012-06-10 NOTE — Transfer of Care (Signed)
Immediate Anesthesia Transfer of Care Note  Patient: [name]  Procedure(s) Performed: Procedure(s): Left ankle skin graft (Left) APPLICATION OF WOUND VAC (Left)  Patient Location: PACU  Anesthesia Type:General  Level of Consciousness: awake, alert  and oriented  Airway & Oxygen Therapy: Patient Spontanous Breathing and Patient connected to nasal cannula oxygen  Post-op Assessment: Report given to PACU RN, Post -op Vital signs reviewed and stable and Patient moving all extremities X 4  Post vital signs: Reviewed and stable  Complications: No apparent anesthesia complications

## 2012-06-10 NOTE — Progress Notes (Signed)
awaiting PT/OT notes to determine discharge disposition.   Sabino Niemann, MSW [phone]

## 2012-06-10 NOTE — Progress Notes (Signed)
[facility] PROGRESS NOTE  KERMAN PFOST WUJ:[id number] DOB: [birth year] DOA: 06/03/2012 PCP: Loreen Freud, DO  Assessment/Plan: Left ankle with abscess ulceration and necrotic tendons/ early osteomyelitis:  -Continue with Vancomycin and Zosyn day 7.  -Patient s/p Excisional debridement of skin soft tissue and tendon and placement of a wound VAC on 4-4 by Dr Lajoyce Corners.  -no worsening pain.  -Patient has good granulation tissue per Dr Lajoyce Corners note.  -S/P skin graft today; will discussed length of antibiotics and when he can start PT.  -Needs SNF at discharge  DM (diabetes mellitus) type II controlled with renal manifestation  - A1C > 11. Lantus 40 units BID. SSI.  -Likely no complaint with medications. Patient had hypoglycemia episode 4-7. lantus was decrease to 30 from 40 to  avoid hypoglycemia.  -Continue to monitor CBG. Adjust insulin as needed.   Hyponatremia  - Secondary to pre renal etiology. Resolved with IV fluids. Lasix on hold.   Acute on chronic renal failure  -resolved with IV fluids. Cr peak to 1.4.  -Cr has decrease to 1.2 -NSL -will monitor (especially with use of vanc)  ANEMIA, PERNICIOUS  -Hb stable.  -needs follow up of B12 as an outpatient and monthly injection depending levels.  HYPERTENSION  -Continue with Diltiazem.  -BP stable  Atrial fibrillation  - Continue Diltiazem and Coumadin. -rate controlled   Hypokalemia  Repleted. K 3.9  UTI Klebsiella:  -continue zosyn for now day 7 out of 10. .  Diarrhea: reported loose BM. Improving. C. Diff neg   Code Status: Full Family Communication: Care discussed with patient.  Disposition Plan: to be determine.   Consultants:  Dr Lajoyce Corners  Procedures: Excisional debridement of skin soft tissue and tendon and placement of a wound VAC 4-4  Antibiotics:  Vancomycin 4-2  Zosyn 4-2  HPI/Subjective: Feeling well, no acute complaints or overnight issues.  Objective: Filed Vitals:   06/10/12 1132  06/10/12 1203 06/10/12 1516 06/10/12 1520  BP:  130/76 105/44 102/48  Pulse:  95 119 120  Temp: 97.8 F (36.6 C) 97.8 F (36.6 C) 98.1 F (36.7 C)   TempSrc:      Resp:  18 18   Height:      Weight:      SpO2:  96% 98%     Intake/Output Summary (Last 24 hours) at 06/10/12 1804 Last data filed at 06/10/12 1745  Gross per 24 hour  Intake   1205 ml  Output   1705 ml  Net   -500 ml   Filed Weights   06/03/12 2100 06/04/12 0034  Weight: 163.295 kg (360 lb) 141.114 kg (311 lb 1.6 oz)    Exam:   General:  No distress; afebrile  Cardiovascular: S 1, S2; regular rate.  Respiratory: CTA  Abdomen: Bs present, soft, NT  Musculoskeletal:Right below knee amputation. Left LE with trace edema, wound vac and skin graft.   Data Reviewed: Basic Metabolic Panel:  Recent Labs Lab 06/06/12 0446 06/07/12 0818 06/08/12 0600 06/09/12 0852 06/10/12 0525 06/10/12 0855  NA 139 138 140 141  --  141  K 3.1* 3.5 3.3* 4.2  --  3.9  CL 103 101 101 104  --  103  CO2 28 28 28 30   --  30  GLUCOSE 85 118* 70 99  --  76  BUN 15 10 8 8   --  9  CREATININE 1.22 1.15 1.26 1.39* 1.45* 1.43*  CALCIUM 8.9 9.1 9.1 9.4  --  9.5   Liver  Function Tests:  Recent Labs Lab 06/03/12 1930 06/10/12 0855  AST 12 22  ALT 11 11  ALKPHOS 52 41  BILITOT 0.5 0.3  PROT 7.3 7.2  ALBUMIN 2.9* 3.0*   CBC:  Recent Labs Lab 06/03/12 1930 06/05/12 1200 06/06/12 0446 06/08/12 0600 06/10/12 0855  WBC 9.6 5.7 6.1 5.9 6.7  NEUTROABS 7.9*  --   --   --   --   HGB 11.3* 11.6* 10.8* 11.7* 12.1*  HCT 33.0* 34.5* 32.1* 35.9* 36.5*  MCV 87.1 85.4 87.5 88.9 88.6  PLT 206 180 192 221 212   CBG:  Recent Labs Lab 06/10/12 0702 06/10/12 0759 06/10/12 0831 06/10/12 1105 06/10/12 1636  GLUCAP 70 69* 72 70 175*    Recent Results (from the past 240 hour(s))  CULTURE, BLOOD (ROUTINE X 2)     Status: None   Collection Time    06/03/12  7:30 PM      Result Value Range Status   Specimen Description  BLOOD RIGHT ARM   Final   Special Requests BOTTLES DRAWN AEROBIC AND ANAEROBIC   Final   Culture  Setup Time 06/03/2012 23:27   Final   Culture NO GROWTH 5 DAYS   Final   Report Status 06/09/2012 FINAL   Final  CULTURE, BLOOD (ROUTINE X 2)     Status: None   Collection Time    06/03/12  7:35 PM      Result Value Range Status   Specimen Description BLOOD RIGHT ARM   Final   Special Requests BOTTLES DRAWN AEROBIC AND ANAEROBIC   Final   Culture  Setup Time 06/03/2012 23:24   Final   Culture NO GROWTH 5 DAYS   Final   Report Status 06/09/2012 FINAL   Final  URINE CULTURE     Status: None   Collection Time    06/03/12  8:21 PM      Result Value Range Status   Specimen Description URINE, CLEAN CATCH   Final   Special Requests NONE   Final   Culture  Setup Time 06/04/2012 02:48   Final   Colony Count >=100,000 COLONIES/ML   Final   Culture KLEBSIELLA OXYTOCA   Final   Report Status 06/06/2012 FINAL   Final   Organism ID, Bacteria KLEBSIELLA OXYTOCA   Final  MRSA PCR SCREENING     Status: None   Collection Time    06/04/12  2:25 AM      Result Value Range Status   MRSA by PCR NEGATIVE  NEGATIVE Final   Comment:            The GeneXpert MRSA Assay (FDA     approved for NASAL specimens     only), is one component of a     comprehensive MRSA colonization     surveillance program. It is not     intended to diagnose MRSA     infection nor to guide or     monitor treatment for     MRSA infections.  CLOSTRIDIUM DIFFICILE BY PCR     Status: None   Collection Time    06/04/12  6:02 AM      Result Value Range Status   C difficile by pcr NEGATIVE  NEGATIVE Final  STOOL CULTURE     Status: None   Collection Time    06/04/12  9:10 AM      Result Value Range Status   Specimen Description STOOL   Final  Special Requests NONE   Final   Culture     Final   Value: NO SALMONELLA, SHIGELLA, CAMPYLOBACTER, YERSINIA, OR E.COLI 0157:H7 ISOLATED   Report Status 06/08/2012 FINAL   Final   SURGICAL PCR SCREEN     Status: None   Collection Time    06/05/12  7:57 AM      Result Value Range Status   MRSA, PCR NEGATIVE  NEGATIVE Final   Staphylococcus aureus NEGATIVE  NEGATIVE Final   Comment:            The Xpert SA Assay (FDA     approved for NASAL specimens     in patients over 34 years of age),     is one component of     a comprehensive surveillance     program.  Test performance has     been validated by [employer] for patients greater     than or equal to 76 year old.     It is not intended     to diagnose infection nor to     guide or monitor treatment.     Studies: No results found.  Scheduled Meds: . allopurinol  100 mg Oral Daily  . atorvastatin  10 mg Oral q1800  . diltiazem  240 mg Oral Daily  . fenofibrate  160 mg Oral Daily  . FLUoxetine  20 mg Oral BID  . insulin aspart  0-15 Units Subcutaneous TID WC  . insulin glargine  30 Units Subcutaneous BID  . levothyroxine  200 mcg Oral QAC breakfast  . pantoprazole  40 mg Oral Daily  . piperacillin-tazobactam (ZOSYN)  IV  3.375 g Intravenous Q8H  . potassium chloride  10 mEq Oral Daily  . rOPINIRole  2 mg Oral QHS  . sodium chloride  3 mL Intravenous Q12H  . tamsulosin  0.4 mg Oral Daily  . vancomycin  2,000 mg Intravenous Q24H  . [START ON 06/11/2012] warfarin  5 mg Oral Custom  . Warfarin - Pharmacist Dosing Inpatient   Does not apply q1800   Continuous Infusions: . lactated ringers 50 mL/hr at 06/09/12 0256    Principal Problem:   Acute osteomyelitis, ankle and foot Active Problems:   DM (diabetes mellitus) type II controlled with renal manifestation   ANEMIA, PERNICIOUS   HYPERTENSION   BKA, RIGHT LEG   Atrial fibrillation   Hyponatremia   Hypokalemia   Acute on chronic renal failure    Time spent: > 25 minutes.     [facility]  [facility] Pager [phone]. If 7PM-7AM, please contact night-coverage at [email or web address], password [facility] 06/10/2012, 6:04 PM  LOS: 7 days

## 2012-06-10 NOTE — Anesthesia Procedure Notes (Signed)
Procedure Name: Intubation Date/Time: 06/10/2012 9:55 AM Performed by: Carmela Rima Pre-anesthesia Checklist: Patient identified, Timeout performed, Emergency Drugs available and Suction available Patient Re-evaluated:Patient Re-evaluated prior to inductionOxygen Delivery Method: Circle system utilized Preoxygenation: Pre-oxygenation with 100% oxygen Intubation Type: IV induction Ventilation: Mask ventilation without difficulty Laryngoscope Size: Mac and 3 Grade View: Grade II Tube type: Oral Tube size: 8.0 mm Number of attempts: 1 Placement Confirmation: ETT inserted through vocal cords under direct vision and breath sounds checked- equal and bilateral Secured at: 23 cm Tube secured with: Tape Dental Injury: Teeth and Oropharynx as per pre-operative assessment

## 2012-06-10 NOTE — Anesthesia Postprocedure Evaluation (Signed)
Anesthesia Post Note  Patient: [name]  Procedure(s) Performed: Procedure(s) (LRB): Left ankle skin graft (Left) APPLICATION OF WOUND VAC (Left)  Anesthesia type: General  Patient location: PACU  Post pain: Pain level controlled and Adequate analgesia  Post assessment: Post-op Vital signs reviewed, Patient's Cardiovascular Status Stable, Respiratory Function Stable, Patent Airway and Pain level controlled  Last Vitals:  Filed Vitals:   06/10/12 1036  BP:   Pulse: 103  Temp: 36.8 C  Resp: 13    Post vital signs: Reviewed and stable  Level of consciousness: awake, alert  and oriented  Complications: No apparent anesthesia complications

## 2012-06-11 DIAGNOSIS — L02419 Cutaneous abscess of limb, unspecified: Secondary | ICD-10-CM

## 2012-06-11 DIAGNOSIS — M9080 Osteopathy in diseases classified elsewhere, unspecified site: Secondary | ICD-10-CM

## 2012-06-11 DIAGNOSIS — E1169 Type 2 diabetes mellitus with other specified complication: Secondary | ICD-10-CM

## 2012-06-11 DIAGNOSIS — L03119 Cellulitis of unspecified part of limb: Secondary | ICD-10-CM

## 2012-06-11 DIAGNOSIS — I10 Essential (primary) hypertension: Secondary | ICD-10-CM

## 2012-06-11 LAB — GLUCOSE, CAPILLARY
Glucose-Capillary: 140 mg/dL — ABNORMAL HIGH (ref 70–99)
Glucose-Capillary: 124 mg/dL — ABNORMAL HIGH (ref 70–99)
Glucose-Capillary: 117 mg/dL — ABNORMAL HIGH (ref 70–99)

## 2012-06-11 MED ORDER — WARFARIN SODIUM 2.5 MG PO TABS
2.5000 mg | ORAL_TABLET | Freq: Once | ORAL | Status: AC
  Administered 2012-06-11: 2.5 mg via ORAL
  Filled 2012-06-11: qty 1

## 2012-06-11 MED ORDER — DOXYCYCLINE HYCLATE 100 MG PO TABS
100.0000 mg | ORAL_TABLET | Freq: Two times a day (BID) | ORAL | Status: DC
  Administered 2012-06-12: 100 mg via ORAL
  Filled 2012-06-11 (×2): qty 1

## 2012-06-11 MED ORDER — INSULIN GLARGINE 100 UNIT/ML ~~LOC~~ SOLN
22.0000 [IU] | Freq: Two times a day (BID) | SUBCUTANEOUS | Status: DC
  Administered 2012-06-11 – 2012-06-12 (×2): 22 [IU] via SUBCUTANEOUS
  Filled 2012-06-11 (×3): qty 0.22

## 2012-06-11 NOTE — Clinical Social Work Psychosocial (Addendum)
Clinical Social Work Department  BRIEF PSYCHOSOCIAL ASSESSMENT  Patient: [name]  Account Number: [id number]  Admit date: 06/03/2012 Clinical Social Worker Sabino Niemann, MSW Date/Time: 06/11/2012 3:15 PM Referred by: Physician Date Referred: 06/10/2012   Referred for   SNF Placement   Other Referral:  Interview type: Patient  Other interview type: PSYCHOSOCIAL DATA  Living Status: roomate Admitted from facility:  Level of care:  Primary support name: Harris,Pat Primary support relationship to patient: Daughter Degree of support available:  Unknown: Patient reported that it is strong  CURRENT CONCERNS  Current Concerns   Post-Acute Placement   Other Concerns:  SOCIAL WORK ASSESSMENT / PLAN  CSW met with pt re: PT recommendation for SNF.   Pt lives with a roommate.   CSW explained placement process and answered questions.   Pt reports that he is not going to a SNF. Patient reports that his roommate, neighbor and daughter are going to help with his care and can provide 24 hour supervision.  .     Assessment/plan status: Information/Referral to Community Resources-SW left resources for patient if he changes his mind Other assessment/ plan:  Information/referral to community resources:  SNF choice list     PATIENT'S/FAMILY'S RESPONSE TO PLAN OF CARE:  Patient is adamant that he is not going to a facility.  SW strongly reminded patient that it is not safe to return home without proper supervision. SW also advised patient that PT and MD have recommended that he go to a facility for a short while. Patient reported that his roommate, neighbor and daughter can provide 24 hour supervision and care. SW informed CM, Vance Peper that patient will need HH set up prior to returning home.  Social Worker will sign off for now as social work intervention is no longer needed. Please consult [place] again if new need arises.   Sabino Niemann, MSW [phone]

## 2012-06-11 NOTE — Progress Notes (Signed)
Patient ID: [name], male   DOB: 1940/09/18, 72 y.o.   MRN: [id number] Postoperative day 1 allograft split thickness skin graft left ankle. Will change wound VAC to a compression dressing on Friday okay for discharge to skilled nursing on Friday.

## 2012-06-11 NOTE — Progress Notes (Signed)
[facility] PROGRESS NOTE  [name] ZOX:[id number] DOB: Dec 27, 1940 DOA: 06/03/2012 PCP: Loreen Freud, DO  Assessment/Plan: Left ankle with abscess ulceration and necrotic tendons/ early osteomyelitis:  -Continue with Vancomycin and Zosyn day 7.  -Patient s/p Excisional debridement of skin soft tissue and tendon and placement of skin graft on 4-9 by Dr Lajoyce Corners.  -no worsening pain.  -Patient has good granulation tissue per Dr Lajoyce Corners note.  -S/P skin graft on 06/10/12; doing ok. Plan is for doxycycline 4 weeks PO after discharge and follow up with orthopedic service.  -Needs SNF at discharge; but refusing it; will ask CM to help with [facility] needs.  DM (diabetes mellitus) type II controlled with renal manifestation  - A1C > 11. Lantus 22 units BID. SSI.  -Likely no compliant with medications. Patient had hypoglycemia episode 4-7.  -Continue to monitor CBG. Adjust insulin as needed.   Hyponatremia  - Secondary to pre renal etiology. Resolved with IV fluids. -Lasix will continue on hold for now.   Acute on chronic renal failure  -resolved with IV fluids. Cr peak to 1.4.  -Cr up to 1.3 (but essentially at baseline for him given GFR) -NSL -vanc to be discontinue after today dose  ANEMIA, PERNICIOUS  -Hb stable.  -needs follow up of B12 as an outpatient and monthly injection depending levels.  HYPERTENSION  -Continue with Diltiazem.  -BP stable  Atrial fibrillation  - Continue Diltiazem and Coumadin. -rate controlled   Hypokalemia  Repleted. K 3.9  UTI Klebsiella:  -continue zosyn for now day 8  -no dysuria and will received doxycycline at discharge for 4 weeks. .  Diarrhea: reported loose BM. Improving. C. Diff neg   Code Status: Full Family Communication: Care discussed with patient.  Disposition Plan: Patient refusing SNF; will ask CM to help with [facility] needs.  Consultants:  Dr Lajoyce Corners  Procedures: Excisional debridement of skin soft tissue and tendon and placement of  a wound VAC 4-4  Antibiotics:  Vancomycin 4-2  Zosyn 4-2  HPI/Subjective: Feeling well, no acute complaints or overnight issues. Patient is refusing SNF short term as recommended by ortho.  Objective: Filed Vitals:   06/10/12 1800 06/10/12 2240 06/11/12 0717 06/11/12 1439  BP: 101/52 110/64 124/66 124/71  Pulse: 110 106 88 79  Temp: 98.1 F (36.7 C) 98.7 F (37.1 C) 97.8 F (36.6 C) 97.7 F (36.5 C)  TempSrc:      Resp: 18 18 18 18   Height:      Weight:      SpO2: 97% 98% 99% 94%    Intake/Output Summary (Last 24 hours) at 06/11/12 1702 Last data filed at 06/11/12 1300  Gross per 24 hour  Intake    600 ml  Output    910 ml  Net   -310 ml   Filed Weights   06/03/12 2100 06/04/12 0034  Weight: 163.295 kg (360 lb) 141.114 kg (311 lb 1.6 oz)    Exam:   General:  No distress; afebrile  Cardiovascular: S 1, S2; regular rate.  Respiratory: CTA  Abdomen: Bs present, soft, NT  Musculoskeletal:Right below knee amputation. Left LE with trace edema, compression dressing and no significant pain. S/P skin graft on 06/10/12.   Data Reviewed: Basic Metabolic Panel:  Recent Labs Lab 06/06/12 0446 06/07/12 0818 06/08/12 0600 06/09/12 0852 06/10/12 0525 06/10/12 0855  NA 139 138 140 141  --  141  K 3.1* 3.5 3.3* 4.2  --  3.9  CL 103 101 101 104  --  103  CO2 28 28 28 30   --  30  GLUCOSE 85 118* 70 99  --  76  BUN 15 10 8 8   --  9  CREATININE 1.22 1.15 1.26 1.39* 1.45* 1.43*  CALCIUM 8.9 9.1 9.1 9.4  --  9.5   Liver Function Tests:  Recent Labs Lab 06/10/12 0855  AST 22  ALT 11  ALKPHOS 41  BILITOT 0.3  PROT 7.2  ALBUMIN 3.0*   CBC:  Recent Labs Lab 06/05/12 1200 06/06/12 0446 06/08/12 0600 06/10/12 0855  WBC 5.7 6.1 5.9 6.7  HGB 11.6* 10.8* 11.7* 12.1*  HCT 34.5* 32.1* 35.9* 36.5*  MCV 85.4 87.5 88.9 88.6  PLT 180 192 221 212   CBG:  Recent Labs Lab 06/10/12 1105 06/10/12 1636 06/11/12 0728 06/11/12 1130 06/11/12 1626  GLUCAP 70  175* 140* 124* 117*    Recent Results (from the past 240 hour(s))  CULTURE, BLOOD (ROUTINE X 2)     Status: None   Collection Time    06/03/12  7:30 PM      Result Value Range Status   Specimen Description BLOOD RIGHT ARM   Final   Special Requests BOTTLES DRAWN AEROBIC AND ANAEROBIC   Final   Culture  Setup Time 06/03/2012 23:27   Final   Culture NO GROWTH 5 DAYS   Final   Report Status 06/09/2012 FINAL   Final  CULTURE, BLOOD (ROUTINE X 2)     Status: None   Collection Time    06/03/12  7:35 PM      Result Value Range Status   Specimen Description BLOOD RIGHT ARM   Final   Special Requests BOTTLES DRAWN AEROBIC AND ANAEROBIC   Final   Culture  Setup Time 06/03/2012 23:24   Final   Culture NO GROWTH 5 DAYS   Final   Report Status 06/09/2012 FINAL   Final  URINE CULTURE     Status: None   Collection Time    06/03/12  8:21 PM      Result Value Range Status   Specimen Description URINE, CLEAN CATCH   Final   Special Requests NONE   Final   Culture  Setup Time 06/04/2012 02:48   Final   Colony Count >=100,000 COLONIES/ML   Final   Culture KLEBSIELLA OXYTOCA   Final   Report Status 06/06/2012 FINAL   Final   Organism ID, Bacteria KLEBSIELLA OXYTOCA   Final  MRSA PCR SCREENING     Status: None   Collection Time    06/04/12  2:25 AM      Result Value Range Status   MRSA by PCR NEGATIVE  NEGATIVE Final   Comment:            The GeneXpert MRSA Assay (FDA     approved for NASAL specimens     only), is one component of a     comprehensive MRSA colonization     surveillance program. It is not     intended to diagnose MRSA     infection nor to guide or     monitor treatment for     MRSA infections.  CLOSTRIDIUM DIFFICILE BY PCR     Status: None   Collection Time    06/04/12  6:02 AM      Result Value Range Status   C difficile by pcr NEGATIVE  NEGATIVE Final  STOOL CULTURE     Status: None   Collection Time  06/04/12  9:10 AM      Result Value Range Status    Specimen Description STOOL   Final   Special Requests NONE   Final   Culture     Final   Value: NO SALMONELLA, SHIGELLA, CAMPYLOBACTER, YERSINIA, OR E.COLI 0157:H7 ISOLATED   Report Status 06/08/2012 FINAL   Final  SURGICAL PCR SCREEN     Status: None   Collection Time    06/05/12  7:57 AM      Result Value Range Status   MRSA, PCR NEGATIVE  NEGATIVE Final   Staphylococcus aureus NEGATIVE  NEGATIVE Final   Comment:            The Xpert SA Assay (FDA     approved for NASAL specimens     in patients over 67 years of age),     is one component of     a comprehensive surveillance     program.  Test performance has     been validated by [employer] for patients greater     than or equal to 43 year old.     It is not intended     to diagnose infection nor to     guide or monitor treatment.     Studies: No results found.  Scheduled Meds: . allopurinol  100 mg Oral Daily  . atorvastatin  10 mg Oral q1800  . diltiazem  240 mg Oral Daily  . [START ON 06/12/2012] doxycycline  100 mg Oral Q12H  . fenofibrate  160 mg Oral Daily  . FLUoxetine  20 mg Oral BID  . insulin aspart  0-15 Units Subcutaneous TID WC  . insulin glargine  30 Units Subcutaneous BID  . levothyroxine  200 mcg Oral QAC breakfast  . pantoprazole  40 mg Oral Daily  . piperacillin-tazobactam (ZOSYN)  IV  3.375 g Intravenous Q8H  . potassium chloride  10 mEq Oral Daily  . rOPINIRole  2 mg Oral QHS  . sodium chloride  3 mL Intravenous Q12H  . tamsulosin  0.4 mg Oral Daily  . vancomycin  2,000 mg Intravenous Q24H  . warfarin  2.5 mg Oral ONCE-1800  . Warfarin - Pharmacist Dosing Inpatient   Does not apply q1800   Continuous Infusions: . lactated ringers 50 mL/hr at 06/09/12 0256    Principal Problem:   Acute osteomyelitis, ankle and foot Active Problems:   DM (diabetes mellitus) type II controlled with renal manifestation   ANEMIA, PERNICIOUS   HYPERTENSION   BKA, RIGHT LEG   Atrial fibrillation    Hyponatremia   Hypokalemia   Acute on chronic renal failure    Time spent: > 25 minutes.     [facility]  [facility] Pager [phone]. If 7PM-7AM, please contact night-coverage at [email or web address], password [facility] 06/11/2012, 5:02 PM  LOS: 8 days

## 2012-06-11 NOTE — Progress Notes (Signed)
pANTICOAGULATION CONSULT NOTE - Follow Up Consult  Pharmacy Consult for Coumadin Indication: atrial fibrillation  No Known Allergies  Patient Measurements: Height: 6' (182.9 cm) Weight: 311 lb 1.6 oz (141.114 kg) IBW/kg (Calculated) : 77.6  Vital Signs: Temp: 97.8 F (36.6 C) (04/10 0717) BP: 124/66 mmHg (04/10 0717) Pulse Rate: 88 (04/10 0717)  Labs:  Recent Labs  06/09/12 0445 06/09/12 0852 06/10/12 0525 06/10/12 0855 06/11/12 0555  HGB  --   --   --  12.1*  --   HCT  --   --   --  36.5*  --   PLT  --   --   --  212  --   LABPROT 24.5*  --  26.3*  --  30.0*  INR 2.33*  --  2.56*  --  3.06*  CREATININE  --  1.39* 1.45* 1.43*  --     Estimated Creatinine Clearance: 67 ml/min (by C-G formula based on Cr of 1.43).   Medications:  Scheduled:  . allopurinol  100 mg Oral Daily  . atorvastatin  10 mg Oral q1800  . diltiazem  240 mg Oral Daily  . fenofibrate  160 mg Oral Daily  . FLUoxetine  20 mg Oral BID  . insulin aspart  0-15 Units Subcutaneous TID WC  . insulin glargine  30 Units Subcutaneous BID  . levothyroxine  200 mcg Oral QAC breakfast  . pantoprazole  40 mg Oral Daily  . piperacillin-tazobactam (ZOSYN)  IV  3.375 g Intravenous Q8H  . potassium chloride  10 mEq Oral Daily  . rOPINIRole  2 mg Oral QHS  . sodium chloride  3 mL Intravenous Q12H  . tamsulosin  0.4 mg Oral Daily  . vancomycin  2,000 mg Intravenous Q24H  . [COMPLETED] warfarin  10 mg Oral Q M,W,F-1800  . warfarin  5 mg Oral Custom  . Warfarin - Pharmacist Dosing Inpatient   Does not apply q1800    Assessment: 72yo male with AFib, on coumadin INR (2.56 > 3.06) is at slightly above goal this AM.  No bleeding problems noted. Likely discharge tomorrow  PTA dose 10mg  on MWF, 5mg  on TTSS  Goal of Therapy:  INR 2-3 Monitor platelets by anticoagulation protocol: Yes   Plan:  1. Coumadin 2.5 mg 2. F/U in AM  Bayard Hugger, PharmD, BCPS  Clinical Pharmacist  Pager: [phone]

## 2012-06-11 NOTE — Progress Notes (Signed)
Inpatient Diabetes Program Recommendations  AACE/ADA: New Consensus Statement on Inpatient Glycemic Control (2013)  Target Ranges:  Prepandial:   less than 140 mg/dL      Peak postprandial:   less than 180 mg/dL (1-2 hours)      Critically ill patients:  140 - 180 mg/dL   Reason for Visit: Results for [name], [name] (MRN [id number]) as of 06/11/2012 15:16  Ref. Range 06/10/2012 11:05 06/10/2012 16:36 06/11/2012 05:55 06/11/2012 07:28 06/11/2012 11:30  Glucose-Capillary Latest Range: 70-99 mg/dL 70 409 (H)  811 (H) 914 (H)   Please reduce Lantus to 20 units bid.  Patient only received 30 units of Lantus on 06/10/12.  Will follow.

## 2012-06-12 ENCOUNTER — Encounter (HOSPITAL_COMMUNITY): Payer: Self-pay | Admitting: Orthopedic Surgery

## 2012-06-12 DIAGNOSIS — E1169 Type 2 diabetes mellitus with other specified complication: Secondary | ICD-10-CM

## 2012-06-12 DIAGNOSIS — E538 Deficiency of other specified B group vitamins: Secondary | ICD-10-CM

## 2012-06-12 DIAGNOSIS — E039 Hypothyroidism, unspecified: Secondary | ICD-10-CM

## 2012-06-12 LAB — PROTIME-INR
INR: 2.84 — ABNORMAL HIGH (ref 0.00–1.49)
Prothrombin Time: 28.4 seconds — ABNORMAL HIGH (ref 11.6–15.2)

## 2012-06-12 LAB — GLUCOSE, CAPILLARY: Glucose-Capillary: 153 mg/dL — ABNORMAL HIGH (ref 70–99)

## 2012-06-12 MED ORDER — HYDROCODONE-ACETAMINOPHEN 7.5-300 MG PO TABS: 1.0000 | ORAL_TABLET | Freq: Four times a day (QID) | ORAL | Status: DC | PRN

## 2012-06-12 MED ORDER — INSULIN GLARGINE 100 UNIT/ML ~~LOC~~ SOLN: 30.0000 [IU] | Freq: Two times a day (BID) | SUBCUTANEOUS | Status: DC

## 2012-06-12 MED ORDER — DOXYCYCLINE HYCLATE 100 MG PO TABS: 100.0000 mg | ORAL_TABLET | Freq: Two times a day (BID) | ORAL | Status: DC

## 2012-06-12 MED ORDER — WARFARIN SODIUM 5 MG PO TABS
5.0000 mg | ORAL_TABLET | Freq: Once | ORAL | Status: DC
  Filled 2012-06-12: qty 1

## 2012-06-12 NOTE — Progress Notes (Signed)
Patient ID: [name], male   DOB: [birth year], 72 y.o.   MRN: [id number] Wound VAC removed. Compression wrap applied. Patient is okay for discharge today. I will followup in the office in one week.

## 2012-06-12 NOTE — Progress Notes (Signed)
Patient ID: [name], male   DOB: 19-Mar-1940, 72 y.o.   MRN: [id number] Patient requests second opinion regarding abscess right foot. Nursing will call orthopedic on-call for: Hospital. Discussed that delaying procedure could not guarantee that the infection would not cause patient to develop a worse infection in her foot.  Patient is to stay n.p.o. with tentative procedure late this afternoon.

## 2012-06-12 NOTE — Discharge Summary (Signed)
Physician Discharge Summary  [name] ZOX:[id number] DOB: [birth year] DOA: 06/03/2012  PCP: Loreen Freud, DO  Admit date: 06/03/2012 Discharge date: 06/12/2012  Time spent: >30 minutes  Recommendations for Outpatient Follow-up:  1. BMET to follow electrolytes and renal function 2. CBC to follow Hgb trend 3. Reassess BP and CBG's and adjust treatment as needed 4. Recheck B12 levels and continue supplementation  Discharge Diagnoses:  Principal Problem:   Acute osteomyelitis, ankle and foot Active Problems:   DM (diabetes mellitus) type II controlled with renal manifestation   ANEMIA, PERNICIOUS   HYPERTENSION   BKA, RIGHT LEG   Atrial fibrillation   Hyponatremia   Hypokalemia   Acute on chronic renal failure   Discharge Condition: stable and improved. Will discharge home with [facility] services. Follow up with Dr. Lajoyce Corners in 1 week  Diet recommendation: heart healthy and low carbohydrates  Filed Weights   06/03/12 2100 06/04/12 0034  Weight: 163.295 kg (360 lb) 141.114 kg (311 lb 1.6 oz)    History of present illness:  72 y.o. 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 3 days ago he noticed the presence of left foot ulcer on the dorsum of his left foot which quickly worsened and now is Foul-smelling. No fever or chills reported. X-rays done in the emergency room did not show any ulcer mellitus   Hospital Course:  Left ankle with abscess ulceration and necrotic tendons/ early osteomyelitis:  -Complete 8 days of vanc and zosyn. -Patient s/p Excisional debridement of skin soft tissue and tendon and placement of skin graft on 4-9 by Dr Lajoyce Corners.  -pain well controlled. Activity for weight bearing to be limited and will follow with Dr. Lajoyce Corners in 1 week. -Patient has good granulation tissue per Dr Lajoyce Corners  note.  -S/P skin graft on 06/10/12; doing ok. Plan is for doxycycline 4 weeks PO after discharge and follow up with orthopedic service.  -discharge home with [facility] services  DM (diabetes mellitus) type II controlled with renal manifestation  - A1C > 11. Will go home with instructions for low carb diet, lantus 30 units BID and also SSI.  -close outpatient follow up.  Hyponatremia  - Secondary to pre renal etiology. Resolved with IV fluids. -Lasix will be resumed at discharge  . Acute on chronic renal failure  -resolved with IV fluids. Cr peak up to 1.4 At discharge (but essentially at baseline for him, given GFR)  -advise to keep himself hydrated -abx used at discharge will not mess with renal failure.  ANEMIA, PERNICIOUS  -Hb stable.  -needs follow up of B12 as an outpatient and monthly injection depending levels.   HYPERTENSION  -Continue with Diltiazem.  -BP stable  -advise to follow low sodium diet  Atrial fibrillation  - Continue Diltiazem and Coumadin.  -rate controlled   Hypokalemia  Repleted. K WNL at discharge.   UTI Klebsiella:  -Received 9 days of IV zosyn  -No dysuria and will received doxycycline at discharge for 4 weeks.   Diarrhea: reported loose BM. Resolved. Most likely due to abx's. C. Diff neg  *Rest of medical problems remains stable; plan is to continue home medication regimen and to follow with PCP for adjustments.  Procedures: Excisional debridement of skin soft tissue and tendon and placement of a wound VAC 4-4  Consultations:  Orthopedics  Discharge Exam: Filed Vitals:   06/11/12 0717 06/11/12 1439 06/11/12 2102 06/12/12 0530  BP: 124/66 124/71 111/74 110/64  Pulse: 88 79 70 92  Temp: 97.8 F (36.6 C) 97.7 F (36.5 C) 98 F (36.7 C) 97.7 F (36.5 C)  TempSrc:   Oral Oral  Resp: 18 18 18 16   Height:      Weight:      SpO2: 99% 94% 97% 93%   General: No distress; afebrile  Cardiovascular: S 1, S2; regular rate.  Respiratory: CTA   Abdomen: Bs present, soft, NT  Musculoskeletal:Right below knee amputation. Left LE with trace edema, compression dressing and no significant pain. S/P skin graft on 06/10/12.   Discharge Instructions  Discharge Orders   Future Orders Complete By Expires     Diet - low sodium heart healthy  As directed     Discharge instructions  As directed     Comments:      Take medications as prescribed Follow up with Dr. Lajoyce Corners in his office in 1 week Keep LLE elevated as much as possible Limited weight bearing on your LLE Follow up with PCP in 2 weeks    Elevate operative extremity  As directed     Non weight bearing  As directed         Medication List    TAKE these medications       allopurinol 100 MG tablet  Commonly known as:  ZYLOPRIM  Take 100 mg by mouth daily.     ascorbic acid 1000 MG tablet  Commonly known as:  VITAMIN C  Take 1,000 mg by mouth daily.     aspirin EC 81 MG tablet  Take 81 mg by mouth daily.     diltiazem 240 MG 24 hr capsule  Commonly known as:  DILACOR XR  Take 240 mg by mouth 2 (two) times daily.     doxycycline 100 MG tablet  Commonly known as:  VIBRA-TABS  Take 1 tablet (100 mg total) by mouth every 12 (twelve) hours.     fenofibrate 160 MG tablet  Take 160 mg by mouth daily.     FLUoxetine 20 MG capsule  Commonly known as:  PROZAC  Take 20 mg by mouth 2 (two) times daily.     furosemide 20 MG tablet  Commonly known as:  LASIX  Take 20 mg by mouth daily.     gabapentin 300 MG capsule  Commonly known as:  NEURONTIN  Take 300 mg by mouth 3 (three) times daily as needed (for nerve pain.).     HUMALOG KWIKPEN 100 UNIT/ML injection  Generic drug:  insulin lispro  Inject 25 Units into the skin 3 (three) times daily before meals.     Hydrocodone-Acetaminophen 7.5-300 MG Tabs  Take 1 tablet by mouth every 6 (six) hours as needed (for pain.).     insulin glargine 100 UNIT/ML injection  Commonly known as:  LANTUS  Inject 0.3 mLs (30 Units  total) into the skin 2 (two) times daily.     levothyroxine 200 MCG tablet  Commonly known as:  SYNTHROID, LEVOTHROID  Take 200 mcg by mouth daily before breakfast.     multivitamin with minerals Tabs  Take 1 tablet by mouth daily.     nystatin powder  Commonly known as:  MYCOSTATIN  Apply topically See admin instructions. Apply 2-3 times daily     potassium chloride 10 MEQ tablet  Commonly known as:  K-DUR,KLOR-CON  Take 10  mEq by mouth daily.     rOPINIRole 2 MG tablet  Commonly known as:  REQUIP  Take 2 mg by mouth at bedtime.     rosuvastatin 20 MG tablet  Commonly known as:  CRESTOR  Take 20 mg by mouth daily.     tamsulosin 0.4 MG Caps  Commonly known as:  FLOMAX  Take 0.4 mg by mouth daily.     VICTOZA 18 MG/3ML Soln injection  Generic drug:  Liraglutide  Inject 1.2 mg into the skin daily.     warfarin 5 MG tablet  Commonly known as:  COUMADIN  Take 5-10 mg by mouth daily. 2 tabs daily on MWF; 1 tab daily otherwise.           Follow-up Information   Follow up with DUDA,MARCUS V, MD In 1 week.   Contact information:   [address] [place] [address] [phone]       Follow up with Loreen Freud, DO. Schedule an appointment as soon as possible for a visit in 2 weeks.   Contact information:   [address] [facility] [address] [place] [address] [phone]        The results of significant diagnostics from this hospitalization (including imaging, microbiology, ancillary and laboratory) are listed below for reference.    Significant Diagnostic Studies: Dg Ankle Complete Left  Jun 07, 2012  *RADIOLOGY REPORT*  Clinical Data: Open wound on the dorsum of the forefoot.  LEFT ANKLE COMPLETE - 3+ VIEW  Comparison: None.  Findings: Osteopenia.  No acute fracture.  No dislocation. Spurring at the inferior calcaneus.  Degenerative changes in the ankle joint.  IMPRESSION: Chronic changes.  No acute bony pathology.   Original Report  Authenticated By: Jolaine Click, M.D.    Mr Ankle Left W Wo Contrast  06/04/2012   * RADIOLOGY REPORT *  Clinical Data:  Draining anterior ankle pain with pain and swelling for 2 weeks.  Evaluate for osteomyelitis.  MRI OF THE LEFT ANKLE WITHOUT AND WITH CONTRAST  Technique: Multiplanar, multisequence MR imaging of the  left ankle was performed before and after the administration of intravenous contrast.  Comparison:  Radiographs June 07, 2012 and left forefoot MRI 12/28/2009.  Contrast: 20mL MULTIHANCE GADOBENATE DIMEGLUMINE 529 MG/ML IV SOLN  FINDINGS: There is an irregular open wound anteriorly at the ankle associated with ill-defined fluid, soft tissue edema and enhancement around the extensor tendons.  There is an ill-defined approximately 3.7 x 1.5 cm peripherally enhancing fluid collection associated with the tibialis anterior tendon, best seen on axial postcontrast image number 13. The foot musculature is diffusely atrophied.  TENDONS Peroneal: There is a longitudinal split tear of the peroneus brevis tendon without discontinuity or associated peroneal sheath fluid. Posteromedial:  Intact. Anterior: As above, there is concern of infectious tenosynovitis associated with the extensor tendons, especially the tibialis anterior.  No tendon disruption is identified. 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  The distal tendon is attenuated, but intact. There is some calcification within the tendon.  No surrounding inflammatory change or fluid collection is seen. Plantar Fascia: There is chronic thickening of the medial cord of the plantar fascia associated with a moderate sized plantar calcaneal spur.  No acute abnormality identified.  LIGAMENTS Lateral: Intact. Medial: Intact.  CARTILAGE Ankle Joint: There is no significant ankle joint effusion, although there is mild synovial enhancement following contrast.  There is nonspecific subchondral cyst formation anteriorly in the talar  dome and tibial plafond,  likely degenerative.  There is no cortical destruction. Subtalar Joints/Sinus Tarsi: The subtalar joint and tarsal sinus appear normal. Bones: There is ill-defined marrow edema and enhancement within the medial and middle cuneiforms.  No cortical destruction is identified on the T1-weighted images.  This is immediately adjacent to the suspected infectious tenosynovitis involving the extensor tendons and could be related to hyperemia, although early osteomyelitis cannot be excluded.  There is mild nonspecific edema and enhancement within the navicular.  No other significant osseous abnormalities are identified.  IMPRESSION:  1.  Open wound anteriorly at the ankle with ill-defined peripheral enhancing fluid collection associated with the extensor tendons highly suspicious for infectious tenosynovitis and abscess formation. 2.  Adjacent marrow edema and enhancement within the medial and middle cuneiforms is nonspecific, although potentially secondary to early osteomyelitis. 3.  Suspected degenerative subchondral cyst formation anteriorly at the tibiotalar joint.  No specific signs of ankle joint infection. 4.  Chronic Achilles tendinosis and partial tearing, partial longitudinal split tear of the peroneus brevis and chronic thickening of the plantar fascia.   Original Report Authenticated By: Carey Bullocks, M.D.    Dg Foot Complete Left  06/03/2012  *RADIOLOGY REPORT*  Clinical Data: Open wound on foot  LEFT FOOT - COMPLETE 3+ VIEW  Comparison: 12/20/2009  Findings: Severe osteopenia.  Periosteal reaction along the third and fourth metatarsals is stable.  No acute fracture.  No dislocation.  Erosion at the dorsum of the navicular is stable. No definite destructive bone lesion.  IMPRESSION: No acute bony pathology.  Chronic changes.   Original Report Authenticated By: Jolaine Click, M.D.     Microbiology: Recent Results (from the past 240 hour(s))  CULTURE, BLOOD (ROUTINE X 2)     Status:  None   Collection Time    06/03/12  7:30 PM      Result Value Range Status   Specimen Description BLOOD RIGHT ARM   Final   Special Requests BOTTLES DRAWN AEROBIC AND ANAEROBIC   Final   Culture  Setup Time 06/03/2012 23:27   Final   Culture NO GROWTH 5 DAYS   Final   Report Status 06/09/2012 FINAL   Final  CULTURE, BLOOD (ROUTINE X 2)     Status: None   Collection Time    06/03/12  7:35 PM      Result Value Range Status   Specimen Description BLOOD RIGHT ARM   Final   Special Requests BOTTLES DRAWN AEROBIC AND ANAEROBIC   Final   Culture  Setup Time 06/03/2012 23:24   Final   Culture NO GROWTH 5 DAYS   Final   Report Status 06/09/2012 FINAL   Final  URINE CULTURE     Status: None   Collection Time    06/03/12  8:21 PM      Result Value Range Status   Specimen Description URINE, CLEAN CATCH   Final   Special Requests NONE   Final   Culture  Setup Time 06/04/2012 02:48   Final   Colony Count >=100,000 COLONIES/ML   Final   Culture KLEBSIELLA OXYTOCA   Final   Report Status 06/06/2012 FINAL   Final   Organism ID, Bacteria KLEBSIELLA OXYTOCA   Final  MRSA PCR SCREENING     Status: None   Collection Time    06/04/12  2:25 AM      Result Value Range Status   MRSA by PCR NEGATIVE  NEGATIVE Final   Comment:  The GeneXpert MRSA Assay (FDA     approved for NASAL specimens     only), is one component of a     comprehensive MRSA colonization     surveillance program. It is not     intended to diagnose MRSA     infection nor to guide or     monitor treatment for     MRSA infections.  CLOSTRIDIUM DIFFICILE BY PCR     Status: None   Collection Time    06/04/12  6:02 AM      Result Value Range Status   C difficile by pcr NEGATIVE  NEGATIVE Final  STOOL CULTURE     Status: None   Collection Time    06/04/12  9:10 AM      Result Value Range Status   Specimen Description STOOL   Final   Special Requests NONE   Final   Culture     Final   Value: NO SALMONELLA,  SHIGELLA, CAMPYLOBACTER, YERSINIA, OR E.COLI 0157:H7 ISOLATED   Report Status 06/08/2012 FINAL   Final  SURGICAL PCR SCREEN     Status: None   Collection Time    06/05/12  7:57 AM      Result Value Range Status   MRSA, PCR NEGATIVE  NEGATIVE Final   Staphylococcus aureus NEGATIVE  NEGATIVE Final   Comment:            The Xpert SA Assay (FDA     approved for NASAL specimens     in patients over 46 years of age),     is one component of     a comprehensive surveillance     program.  Test performance has     been validated by [employer] for patients greater     than or equal to 58 year old.     It is not intended     to diagnose infection nor to     guide or monitor treatment.     Labs: Basic Metabolic Panel:  Recent Labs Lab 06/06/12 0446 06/07/12 0818 06/08/12 0600 06/09/12 0852 06/10/12 0525 06/10/12 0855  NA 139 138 140 141  --  141  K 3.1* 3.5 3.3* 4.2  --  3.9  CL 103 101 101 104  --  103  CO2 28 28 28 30   --  30  GLUCOSE 85 118* 70 99  --  76  BUN 15 10 8 8   --  9  CREATININE 1.22 1.15 1.26 1.39* 1.45* 1.43*  CALCIUM 8.9 9.1 9.1 9.4  --  9.5   Liver Function Tests:  Recent Labs Lab 06/10/12 0855  AST 22  ALT 11  ALKPHOS 41  BILITOT 0.3  PROT 7.2  ALBUMIN 3.0*   CBC:  Recent Labs Lab 06/06/12 0446 06/08/12 0600 06/10/12 0855  WBC 6.1 5.9 6.7  HGB 10.8* 11.7* 12.1*  HCT 32.1* 35.9* 36.5*  MCV 87.5 88.9 88.6  PLT 192 221 212   CBG:  Recent Labs Lab 06/11/12 0728 06/11/12 1130 06/11/12 1626 06/11/12 2155 06/12/12 0646  GLUCAP 140* 124* 117* 203* 89     Signed:  Zyion [name]  [facility] 06/12/2012, 12:58 PM

## 2012-06-12 NOTE — Care Management Note (Signed)
CARE MANAGEMENT NOTE 06/12/2012  Patient:  [name], [name]   Account Number:  [id number]  Date Initiated:  06/04/2012  Documentation initiated by:  DAVIS,TYMEEKA  Subjective/Objective Assessment:   72 yo male admitted with ulcer cellulitis. PTA pt from home with [facility] services.     Action/Plan:   Home when stable   Anticipated DC Date:  06/12/2012   Anticipated DC Plan:  HOME W HOME HEALTH SERVICES  In-house referral  Clinical Social Worker      DC Associate Professor  CM consult      [facility] Choice  HOME HEALTH   Choice offered to / List presented to:  NA        HH arranged  HH-1 RN      [facility] agency  [facility]   Status of service:  Completed, signed off Medicare Important Message given?   (If response is "NO", the following Medicare IM given date fields will be blank) Date Medicare IM given:   Date Additional Medicare IM given:    Discharge Disposition:  HOME W HOME HEALTH SERVICES  Per UR Regulation:  Reviewed for med. necessity/level of care/duration of stay  If discussed at Long Length of Stay Meetings, dates discussed:    Comments:  06/04/12 1140 Tymeeka Davis,RN,BSN [phone]

## 2012-06-12 NOTE — Progress Notes (Signed)
pANTICOAGULATION CONSULT NOTE - Follow Up Consult  Pharmacy Consult for Coumadin Indication: atrial fibrillation  No Known Allergies  Patient Measurements: Height: 6' (182.9 cm) Weight: 311 lb 1.6 oz (141.114 kg) IBW/kg (Calculated) : 77.6  Vital Signs: Temp: 97.7 F (36.5 C) (04/11 0530) Temp src: Oral (04/11 0530) BP: 110/64 mmHg (04/11 0530) Pulse Rate: 92 (04/11 0530)  Labs:  Recent Labs  06/10/12 0525 06/10/12 0855 06/11/12 0555 06/12/12 0655  HGB  --  12.1*  --   --   HCT  --  36.5*  --   --   PLT  --  212  --   --   LABPROT 26.3*  --  30.0* 28.4*  INR 2.56*  --  3.06* 2.84*  CREATININE 1.45* 1.43*  --   --     Estimated Creatinine Clearance: 67 ml/min (by C-G formula based on Cr of 1.43).   Medications:  Scheduled:  . allopurinol  100 mg Oral Daily  . atorvastatin  10 mg Oral q1800  . diltiazem  240 mg Oral Daily  . doxycycline  100 mg Oral Q12H  . fenofibrate  160 mg Oral Daily  . FLUoxetine  20 mg Oral BID  . insulin aspart  0-15 Units Subcutaneous TID WC  . insulin glargine  22 Units Subcutaneous BID  . levothyroxine  200 mcg Oral QAC breakfast  . pantoprazole  40 mg Oral Daily  . [COMPLETED] piperacillin-tazobactam (ZOSYN)  IV  3.375 g Intravenous Q8H  . potassium chloride  10 mEq Oral Daily  . rOPINIRole  2 mg Oral QHS  . sodium chloride  3 mL Intravenous Q12H  . tamsulosin  0.4 mg Oral Daily  . [COMPLETED] vancomycin  2,000 mg Intravenous Q24H  . [COMPLETED] warfarin  2.5 mg Oral ONCE-1800  . Warfarin - Pharmacist Dosing Inpatient   Does not apply q1800  . [DISCONTINUED] insulin glargine  30 Units Subcutaneous BID  . [DISCONTINUED] warfarin  5 mg Oral Custom    Assessment: 72yo male with AFib, on coumadin INR (2.84) is at goal this AM.  No bleeding problems noted.   PTA dose 10mg  on MWF, 5mg  on TTSS  Goal of Therapy:  INR 2-3 Monitor platelets by anticoagulation protocol: Yes   Plan:  1. Coumadin 5 mg 2. F/U daily INR  Bayard Hugger,  PharmD, BCPS  Clinical Pharmacist  Pager: [phone]

## 2012-06-12 NOTE — Progress Notes (Signed)
Reviewed discharge instructions with patient before he was transported home by ambulance. Thank you, Maralyn Sago

## 2012-06-28 ENCOUNTER — Other Ambulatory Visit: Payer: Self-pay | Admitting: Family Medicine

## 2012-07-08 ENCOUNTER — Other Ambulatory Visit: Payer: Self-pay | Admitting: Orthopedic Surgery

## 2012-07-08 DIAGNOSIS — L97929 Non-pressure chronic ulcer of unspecified part of left lower leg with unspecified severity: Secondary | ICD-10-CM

## 2012-07-19 ENCOUNTER — Other Ambulatory Visit: Payer: Self-pay | Admitting: Family Medicine

## 2012-07-20 ENCOUNTER — Other Ambulatory Visit: Payer: Self-pay | Admitting: Family Medicine

## 2012-07-20 ENCOUNTER — Ambulatory Visit
Admission: RE | Admit: 2012-07-20 | Discharge: 2012-07-20 | Disposition: A | Discharge status: Home / Self Care | Payer: Medicare Other | Source: Ambulatory Visit | Attending: Orthopedic Surgery | Admitting: Orthopedic Surgery

## 2012-07-20 DIAGNOSIS — L97929 Non-pressure chronic ulcer of unspecified part of left lower leg with unspecified severity: Secondary | ICD-10-CM

## 2012-07-20 NOTE — Telephone Encounter (Signed)
Last OV 04-30-12, last filled 06-12-12 #45

## 2012-07-29 ENCOUNTER — Other Ambulatory Visit: Payer: Self-pay | Admitting: Family Medicine

## 2012-08-02 ENCOUNTER — Other Ambulatory Visit: Payer: Self-pay | Admitting: Family Medicine

## 2012-08-17 DIAGNOSIS — L03119 Cellulitis of unspecified part of limb: Secondary | ICD-10-CM

## 2012-08-17 DIAGNOSIS — L02419 Cutaneous abscess of limb, unspecified: Secondary | ICD-10-CM

## 2012-09-10 ENCOUNTER — Other Ambulatory Visit: Payer: Self-pay

## 2012-09-27 ENCOUNTER — Other Ambulatory Visit: Payer: Self-pay | Admitting: Family Medicine

## 2012-10-16 ENCOUNTER — Other Ambulatory Visit: Payer: Self-pay | Admitting: Family Medicine

## 2012-10-26 DIAGNOSIS — M9080 Osteopathy in diseases classified elsewhere, unspecified site: Secondary | ICD-10-CM

## 2012-10-26 DIAGNOSIS — I10 Essential (primary) hypertension: Secondary | ICD-10-CM

## 2012-10-26 DIAGNOSIS — L02419 Cutaneous abscess of limb, unspecified: Secondary | ICD-10-CM

## 2012-10-26 DIAGNOSIS — E1169 Type 2 diabetes mellitus with other specified complication: Secondary | ICD-10-CM

## 2012-10-26 DIAGNOSIS — L03119 Cellulitis of unspecified part of limb: Secondary | ICD-10-CM

## 2012-10-28 ENCOUNTER — Telehealth: Payer: Self-pay

## 2012-10-28 ENCOUNTER — Ambulatory Visit (INDEPENDENT_AMBULATORY_CARE_PROVIDER_SITE_OTHER): Payer: Medicare Other | Admitting: Family Medicine

## 2012-10-28 ENCOUNTER — Encounter: Payer: Self-pay | Admitting: Family Medicine

## 2012-10-28 VITALS — BP 114/68 | HR 90 | Temp 97.8°F

## 2012-10-28 DIAGNOSIS — N058 Unspecified nephritic syndrome with other morphologic changes: Secondary | ICD-10-CM

## 2012-10-28 DIAGNOSIS — G8929 Other chronic pain: Secondary | ICD-10-CM

## 2012-10-28 DIAGNOSIS — N400 Enlarged prostate without lower urinary tract symptoms: Secondary | ICD-10-CM

## 2012-10-28 DIAGNOSIS — I4891 Unspecified atrial fibrillation: Secondary | ICD-10-CM

## 2012-10-28 DIAGNOSIS — E1142 Type 2 diabetes mellitus with diabetic polyneuropathy: Secondary | ICD-10-CM

## 2012-10-28 DIAGNOSIS — M109 Gout, unspecified: Secondary | ICD-10-CM

## 2012-10-28 DIAGNOSIS — G2581 Restless legs syndrome: Secondary | ICD-10-CM

## 2012-10-28 DIAGNOSIS — E785 Hyperlipidemia, unspecified: Secondary | ICD-10-CM

## 2012-10-28 DIAGNOSIS — E1140 Type 2 diabetes mellitus with diabetic neuropathy, unspecified: Secondary | ICD-10-CM

## 2012-10-28 DIAGNOSIS — G609 Hereditary and idiopathic neuropathy, unspecified: Secondary | ICD-10-CM

## 2012-10-28 DIAGNOSIS — E1149 Type 2 diabetes mellitus with other diabetic neurological complication: Secondary | ICD-10-CM

## 2012-10-28 DIAGNOSIS — E1129 Type 2 diabetes mellitus with other diabetic kidney complication: Secondary | ICD-10-CM

## 2012-10-28 DIAGNOSIS — E876 Hypokalemia: Secondary | ICD-10-CM

## 2012-10-28 DIAGNOSIS — IMO0002 Reserved for concepts with insufficient information to code with codable children: Secondary | ICD-10-CM

## 2012-10-28 DIAGNOSIS — E039 Hypothyroidism, unspecified: Secondary | ICD-10-CM

## 2012-10-28 DIAGNOSIS — I10 Essential (primary) hypertension: Secondary | ICD-10-CM

## 2012-10-28 LAB — CBC WITH DIFFERENTIAL/PLATELET
Basophils Relative: 0.4 % (ref 0.0–3.0)
Eosinophils Relative: 1.7 % (ref 0.0–5.0)
HCT: 33.5 % — ABNORMAL LOW (ref 39.0–52.0)
Hemoglobin: 11.4 g/dL — ABNORMAL LOW (ref 13.0–17.0)
Lymphs Abs: 1 10*3/uL (ref 0.7–4.0)
Monocytes Relative: 5.5 % (ref 3.0–12.0)
Neutro Abs: 4.2 10*3/uL (ref 1.4–7.7)
RBC: 3.83 Mil/uL — ABNORMAL LOW (ref 4.22–5.81)
RDW: 16.3 % — ABNORMAL HIGH (ref 11.5–14.6)
WBC: 5.7 10*3/uL (ref 4.5–10.5)

## 2012-10-28 MED ORDER — INSULIN LISPRO 100 UNIT/ML ~~LOC~~ SOLN: 25.0000 [IU] | Freq: Three times a day (TID) | SUBCUTANEOUS | Status: DC

## 2012-10-28 MED ORDER — LEVOTHYROXINE SODIUM 200 MCG PO TABS: 200.0000 ug | ORAL_TABLET | Freq: Every day | ORAL | Status: AC

## 2012-10-28 MED ORDER — ALLOPURINOL 100 MG PO TABS: ORAL_TABLET | ORAL | Status: DC

## 2012-10-28 MED ORDER — GABAPENTIN 300 MG PO CAPS: 300.0000 mg | ORAL_CAPSULE | Freq: Three times a day (TID) | ORAL | Status: DC | PRN

## 2012-10-28 MED ORDER — ROSUVASTATIN CALCIUM 20 MG PO TABS: ORAL_TABLET | ORAL | Status: DC

## 2012-10-28 MED ORDER — DILTIAZEM HCL ER 240 MG PO CP24: 240.0000 mg | ORAL_CAPSULE | Freq: Two times a day (BID) | ORAL | Status: AC

## 2012-10-28 MED ORDER — POTASSIUM CHLORIDE ER 10 MEQ PO TBCR: EXTENDED_RELEASE_TABLET | ORAL | Status: DC

## 2012-10-28 MED ORDER — INSULIN GLARGINE 100 UNIT/ML ~~LOC~~ SOLN: 30.0000 [IU] | Freq: Two times a day (BID) | SUBCUTANEOUS | Status: DC

## 2012-10-28 MED ORDER — WARFARIN SODIUM 5 MG PO TABS: 5.0000 mg | ORAL_TABLET | Freq: Every day | ORAL | Status: DC

## 2012-10-28 MED ORDER — TAMSULOSIN HCL 0.4 MG PO CAPS: 0.4000 mg | ORAL_CAPSULE | Freq: Every day | ORAL | Status: AC

## 2012-10-28 MED ORDER — HYDROCODONE-ACETAMINOPHEN 7.5-300 MG PO TABS: ORAL_TABLET | ORAL | Status: DC

## 2012-10-28 MED ORDER — ROPINIROLE HCL 2 MG PO TABS: ORAL_TABLET | ORAL | Status: DC

## 2012-10-28 NOTE — Assessment & Plan Note (Signed)
Check labs 

## 2012-10-28 NOTE — Assessment & Plan Note (Signed)
Check labs con't meds 

## 2012-10-28 NOTE — Progress Notes (Signed)
  Subjective:    Patient ID: [name], male    DOB: 06/29/1940, 72 y.o.   MRN: [id number]  HPI HYPERTENSION Disease Monitoring Blood pressure range-not checking Chest pain- no      Dyspnea- no Medications Compliance- good Lightheadedness- no   Edema- yes   DIABETES Disease Monitoring Blood Sugar ranges-under 120 Polyuria- no New Visual problems- no Medications Compliance- good Hypoglycemic symptoms- no   HYPERLIPIDEMIA Disease Monitoring See symptoms for Hypertension Medications Compliance- good RUQ pain- no  Muscle aches- no  ROS See HPI above   PMH Smoking Status noted     Review of Systems    as above  Objective:   Physical Exam  BP 114/68  Pulse 90  Temp(Src) 97.8 F (36.6 C) (Oral)  SpO2 96% General appearance: alert, cooperative, appears stated age, no distress and morbidly obese Neck: no adenopathy, supple, symmetrical, trachea midline and thyroid not enlarged, symmetric, no tenderness/mass/nodules Lungs: clear to auscultation bilaterally Heart: S1, S2 normal Extremities: R BKA,  L low ext-- +pitting edema and foot is wrapped up--treated by home health.  Pt prefers not to remove bandage.      Assessment & Plan:

## 2012-10-28 NOTE — Patient Instructions (Signed)
Warfarin  Warfarin is a blood thinner (anticoagulant) medicine. It is used to thin the blood so blood clots do not form. This medicine may cause very bad bleeding. You may also bruise easily while on this medicine. You may also bleed a little longer if you cut yourself.  Before taking warfarin, tell your doctor if:  You take any other medicine for your heart or blood pressure.  You are pregnant.  You plan to get pregnant.  You are breastfeeding.  You are allergic to any medicine. HOME CARE  Have your blood checked as told by your doctor. Do not miss visits. Blood tests will include the PT and INR tests. These tests measure how long it takes for a clot to form in a blood sample. The test results help your doctor change your dose of warfarin if needed. If you miss your blood test visits, you could have bad bleeding or blood clots.  Take warfarin exactly as told by your doctor. If you do not, bleeding or blood clots could lead to lasting injury, pain, or disability.  Take your medicine at the same time every day. Do not miss a dose.  Tell your doctor about all medicine you take. This includes medicines, vitamins, natural products, or dietary pills (supplements). Certain medicines are not safe to take while taking warfarin. Taking other medicines while taking warfarin can affect your PT and INR test results.  Do not start or stop any medicine unless you have been told to do so by your doctor.  Do not take anything that has aspirin in it unless your doctor says it is okay.  Eat the same amount of vitamin K foods every day. Vitamin K foods can change how warfarin works and your PT and INR test results.   High vitamin K foods: Beef liver. Pork liver. Green tea. Broccoli. Brussels sprouts. Cauliflower. Chickpeas. Kale. Spinach. Turnip greens.  Medium vitamin K foods: Chicken liver. Pork tenderloin. Cheddar cheese. Rolled oats. Coffee. Asparagus. Cabbage. Iceberg lettuce.  Low vitamin K  foods: Apples. Butter. Bananas. Skim or 1% milk. Canned pears. White bread. Strawberries. Corn. Tomatoes. Green beans. Eggs. Potatoes. Tomasa Blase. Pumpkin. Chicken breasts. Ground beef. Oil (except soybean oil).  Avoid making major changes to your normal diet. Talk to your doctor first before changing your normal diet.  Do not drink alcohol.  Tell your doctors and dentists that you are taking warfarin at the beginning of every visit. GET HELP RIGHT AWAY IF:  You miss a dose of warfarin. Do not take 2 doses at the same time.  You have a skin rash.  You have heavy or unusual bleeding.  There is blood in your pee (urine) or poop (stool).  You have side effects from medicine that do not get better after a few days. MAKE SURE YOU:  Understand these instructions.  Will watch your condition.  Will get help right away if you are not doing well or get worse. Document Released: 03/23/2010 Document Revised: 08/20/2011 Document Reviewed: 03/23/2010 [facility] Patient Information 2014 [place], [place].   Take 2 tabs coumadin on Wed and 1 all other days--- recheck PT  INR in 2 weeks

## 2012-10-28 NOTE — Assessment & Plan Note (Signed)
Check labs stable 

## 2012-10-28 NOTE — Telephone Encounter (Signed)
Message copied by Arnette Norris on Wed Oct 28, 2012  4:35 PM ------      Message from: Lelon Perla      Created: Wed Oct 28, 2012 11:50 AM       Pt needs PT/ INR next week      Home health has been checking his foot--he didn't want banadages off today--- ask them if they think he needs to see podiatrist. ------

## 2012-10-28 NOTE — Assessment & Plan Note (Signed)
con't meds INR today 1.9---- see avs for coumadin

## 2012-10-28 NOTE — Progress Notes (Signed)
Pt could not use the bathroom .   [name] T.

## 2012-10-28 NOTE — Telephone Encounter (Signed)
Spoke with Becky at [facility] and she said she would Associate Professor the RN who is caring for the patient aware and will have her call if there is any concerns.      KP

## 2012-10-29 LAB — LIPID PANEL
Cholesterol: 180 mg/dL (ref 0–200)
HDL: 22.4 mg/dL — ABNORMAL LOW (ref >39.00)
Total CHOL/HDL Ratio: 8
Triglycerides: 382 mg/dL — ABNORMAL HIGH (ref 0.0–149.0)
VLDL: 76.4 mg/dL — ABNORMAL HIGH (ref 0.0–40.0)

## 2012-10-29 LAB — HEPATIC FUNCTION PANEL
ALT: 11 U/L (ref 0–53)
AST: 20 U/L (ref 0–37)
Albumin: 3.4 g/dL — ABNORMAL LOW (ref 3.5–5.2)
Alkaline Phosphatase: 31 U/L — ABNORMAL LOW (ref 39–117)
Total Protein: 7.4 g/dL (ref 6.0–8.3)

## 2012-10-29 LAB — BASIC METABOLIC PANEL
GFR: 51.44 mL/min — ABNORMAL LOW (ref >60.00)
Glucose, Bld: 295 mg/dL — ABNORMAL HIGH (ref 70–99)
Potassium: 4 mEq/L (ref 3.5–5.1)
Sodium: 135 mEq/L (ref 135–145)

## 2012-10-29 LAB — PSA: PSA: 0.09 ng/mL — ABNORMAL LOW (ref 0.10–4.00)

## 2012-10-30 LAB — LDL CHOLESTEROL, DIRECT: Direct LDL: 111 mg/dL

## 2012-10-31 ENCOUNTER — Other Ambulatory Visit: Payer: Self-pay | Admitting: Family Medicine

## 2012-10-31 DIAGNOSIS — E1159 Type 2 diabetes mellitus with other circulatory complications: Secondary | ICD-10-CM

## 2012-10-31 DIAGNOSIS — E039 Hypothyroidism, unspecified: Secondary | ICD-10-CM

## 2012-11-06 ENCOUNTER — Telehealth: Payer: Self-pay | Admitting: Family Medicine

## 2012-11-06 NOTE — Telephone Encounter (Signed)
Dr.Lowne I got this late--please advise     KP

## 2012-11-06 NOTE — Telephone Encounter (Signed)
Amber with [facility] is calling with PT/INR results.   PT was 38.6     INR was 3.2.  She also wants [place] to know that the patient was initially taking 10mg  of Coumadin but he has been taking 5mg  since last Friday as instructed by Dr. Laury Axon. She does not know how long he had been taking 10mg . Amber can be reached back at ph#: [phone] if there are any questions. No call back requested.

## 2012-11-07 NOTE — Telephone Encounter (Signed)
Take 1/2 5 mg on Mon and 5 mg all other days---- recheck 2 weeks

## 2012-11-09 NOTE — Telephone Encounter (Signed)
Pt and Amber the home health nurse were both notified.

## 2012-11-12 ENCOUNTER — Other Ambulatory Visit: Payer: Self-pay | Admitting: Family Medicine

## 2012-11-13 NOTE — Telephone Encounter (Signed)
Last seen and filled 10/28/12 #180. Please advise     KP

## 2012-11-18 MED ORDER — FENOFIBRATE 160 MG PO TABS: 160.0000 mg | ORAL_TABLET | Freq: Every day | ORAL | Status: DC

## 2012-11-19 ENCOUNTER — Telehealth: Payer: Self-pay | Admitting: *Deleted

## 2012-11-19 NOTE — Telephone Encounter (Signed)
[employer], home health nurse LVM [phone]) advised her to have pt to take 1/2 tablet Mon, Wed, Fri and 1 tablet all other days -- hold for today. Recheck PT and INR next week. She has put in a referral to the wound clink for his heel. Call if you have any other questions or problems.

## 2012-11-19 NOTE — Telephone Encounter (Signed)
Refer to wound clinic i have that he was told to take 1/2 5 mg tab on mon and 1 tab all other days--------- take 1/2 tab Mon, WED, Fri and 1 all other days----hold today  Recheck next week

## 2012-11-19 NOTE — Telephone Encounter (Signed)
Hospital doctor, home health nurse [phone]) called with Critical Labs on pt: PT - 54.6 and INR - 4.6. Pt states that pt's wound on L foot (heel) is deteriorating. She wanted Dr Laury Axon to be aware. Please advise so that I can return call to nurse. Thank you.

## 2012-11-22 ENCOUNTER — Emergency Department (HOSPITAL_COMMUNITY): Payer: Medicare Other

## 2012-11-22 ENCOUNTER — Encounter (HOSPITAL_COMMUNITY): Payer: Self-pay | Admitting: *Deleted

## 2012-11-22 ENCOUNTER — Inpatient Hospital Stay (HOSPITAL_COMMUNITY)
Admission: EM | Admit: 2012-11-22 | Discharge: 2012-11-27 | DRG: 854 | Disposition: A | Discharge status: Skilled Nursing Facility | Payer: Medicare Other | Attending: Internal Medicine | Admitting: Internal Medicine

## 2012-11-22 DIAGNOSIS — Z7901 Long term (current) use of anticoagulants: Secondary | ICD-10-CM

## 2012-11-22 DIAGNOSIS — A419 Sepsis, unspecified organism: Principal | ICD-10-CM | POA: Diagnosis present

## 2012-11-22 DIAGNOSIS — D649 Anemia, unspecified: Secondary | ICD-10-CM | POA: Diagnosis present

## 2012-11-22 DIAGNOSIS — M109 Gout, unspecified: Secondary | ICD-10-CM | POA: Diagnosis present

## 2012-11-22 DIAGNOSIS — Z794 Long term (current) use of insulin: Secondary | ICD-10-CM

## 2012-11-22 DIAGNOSIS — IMO0002 Reserved for concepts with insufficient information to code with codable children: Secondary | ICD-10-CM

## 2012-11-22 DIAGNOSIS — Z79899 Other long term (current) drug therapy: Secondary | ICD-10-CM

## 2012-11-22 DIAGNOSIS — I96 Gangrene, not elsewhere classified: Secondary | ICD-10-CM

## 2012-11-22 DIAGNOSIS — E785 Hyperlipidemia, unspecified: Secondary | ICD-10-CM | POA: Diagnosis present

## 2012-11-22 DIAGNOSIS — R739 Hyperglycemia, unspecified: Secondary | ICD-10-CM

## 2012-11-22 DIAGNOSIS — G4730 Sleep apnea, unspecified: Secondary | ICD-10-CM | POA: Diagnosis present

## 2012-11-22 DIAGNOSIS — S88119A Complete traumatic amputation at level between knee and ankle, unspecified lower leg, initial encounter: Secondary | ICD-10-CM

## 2012-11-22 DIAGNOSIS — E878 Other disorders of electrolyte and fluid balance, not elsewhere classified: Secondary | ICD-10-CM

## 2012-11-22 DIAGNOSIS — E1129 Type 2 diabetes mellitus with other diabetic kidney complication: Secondary | ICD-10-CM

## 2012-11-22 DIAGNOSIS — E1152 Type 2 diabetes mellitus with diabetic peripheral angiopathy with gangrene: Secondary | ICD-10-CM | POA: Diagnosis present

## 2012-11-22 DIAGNOSIS — Z23 Encounter for immunization: Secondary | ICD-10-CM

## 2012-11-22 DIAGNOSIS — M86179 Other acute osteomyelitis, unspecified ankle and foot: Secondary | ICD-10-CM

## 2012-11-22 DIAGNOSIS — E039 Hypothyroidism, unspecified: Secondary | ICD-10-CM | POA: Diagnosis present

## 2012-11-22 DIAGNOSIS — N183 Chronic kidney disease, stage 3 unspecified: Secondary | ICD-10-CM | POA: Diagnosis present

## 2012-11-22 DIAGNOSIS — N179 Acute kidney failure, unspecified: Secondary | ICD-10-CM

## 2012-11-22 DIAGNOSIS — M1990 Unspecified osteoarthritis, unspecified site: Secondary | ICD-10-CM | POA: Diagnosis present

## 2012-11-22 DIAGNOSIS — Z87891 Personal history of nicotine dependence: Secondary | ICD-10-CM

## 2012-11-22 DIAGNOSIS — I4891 Unspecified atrial fibrillation: Secondary | ICD-10-CM | POA: Diagnosis present

## 2012-11-22 DIAGNOSIS — N189 Chronic kidney disease, unspecified: Secondary | ICD-10-CM | POA: Diagnosis present

## 2012-11-22 DIAGNOSIS — I10 Essential (primary) hypertension: Secondary | ICD-10-CM | POA: Diagnosis present

## 2012-11-22 DIAGNOSIS — I129 Hypertensive chronic kidney disease with stage 1 through stage 4 chronic kidney disease, or unspecified chronic kidney disease: Secondary | ICD-10-CM | POA: Diagnosis present

## 2012-11-22 DIAGNOSIS — E871 Hypo-osmolality and hyponatremia: Secondary | ICD-10-CM

## 2012-11-22 DIAGNOSIS — L97409 Non-pressure chronic ulcer of unspecified heel and midfoot with unspecified severity: Secondary | ICD-10-CM | POA: Diagnosis present

## 2012-11-22 DIAGNOSIS — Z6841 Body Mass Index (BMI) 40.0 and over, adult: Secondary | ICD-10-CM

## 2012-11-22 DIAGNOSIS — I509 Heart failure, unspecified: Secondary | ICD-10-CM | POA: Diagnosis present

## 2012-11-22 DIAGNOSIS — E1159 Type 2 diabetes mellitus with other circulatory complications: Secondary | ICD-10-CM | POA: Diagnosis present

## 2012-11-22 HISTORY — DX: Personal history of other medical treatment: Z92.89

## 2012-11-22 LAB — CBC WITH DIFFERENTIAL/PLATELET
Basophils Absolute: 0 10*3/uL (ref 0.0–0.1)
Basophils Relative: 0 % (ref 0–1)
Eosinophils Absolute: 0 10*3/uL (ref 0.0–0.7)
Eosinophils Relative: 0 % (ref 0–5)
HCT: 23.3 % — ABNORMAL LOW (ref 39.0–52.0)
Hemoglobin: 7.7 g/dL — ABNORMAL LOW (ref 13.0–17.0)
MCH: 28.7 pg (ref 26.0–34.0)
MCHC: 33 g/dL (ref 30.0–36.0)
MCV: 86.9 fL (ref 78.0–100.0)
Monocytes Absolute: 0.7 10*3/uL (ref 0.1–1.0)
Monocytes Relative: 7 % (ref 3–12)
Neutrophils Relative %: 86 % — ABNORMAL HIGH (ref 43–77)

## 2012-11-22 LAB — COMPREHENSIVE METABOLIC PANEL
ALT: 18 U/L (ref 0–53)
Albumin: 2.4 g/dL — ABNORMAL LOW (ref 3.5–5.2)
BUN: 48 mg/dL — ABNORMAL HIGH (ref 6–23)
Calcium: 8.5 mg/dL (ref 8.4–10.5)
Creatinine, Ser: 2.99 mg/dL — ABNORMAL HIGH (ref 0.50–1.35)
GFR calc Af Amer: 22 mL/min — ABNORMAL LOW (ref >90)
Potassium: 4.1 mEq/L (ref 3.5–5.1)
Total Protein: 6.8 g/dL (ref 6.0–8.3)

## 2012-11-22 LAB — GLUCOSE, CAPILLARY: Glucose-Capillary: 281 mg/dL — ABNORMAL HIGH (ref 70–99)

## 2012-11-22 LAB — SEDIMENTATION RATE: Sed Rate: 140 mm/hr — ABNORMAL HIGH (ref 0–16)

## 2012-11-22 MED ORDER — TETANUS-DIPHTH-ACELL PERTUSSIS 5-2.5-18.5 LF-MCG/0.5 IM SUSP: 0.5000 mL | Freq: Once | INTRAMUSCULAR | Status: DC

## 2012-11-22 MED ORDER — VANCOMYCIN HCL 10 G IV SOLR
2000.0000 mg | Freq: Once | INTRAVENOUS | Status: AC
  Administered 2012-11-22: 2000 mg via INTRAVENOUS
  Filled 2012-11-22: qty 2000

## 2012-11-22 MED ORDER — PIPERACILLIN-TAZOBACTAM 3.375 G IVPB 30 MIN
3.3750 g | Freq: Once | INTRAVENOUS | Status: AC
  Administered 2012-11-22: 3.375 g via INTRAVENOUS
  Filled 2012-11-22: qty 50

## 2012-11-22 MED ORDER — SODIUM CHLORIDE 0.9 % IV BOLUS (SEPSIS)
500.0000 mL | Freq: Once | INTRAVENOUS | Status: AC
  Administered 2012-11-22: 500 mL via INTRAVENOUS

## 2012-11-22 MED ORDER — SODIUM CHLORIDE 0.9 % IV BOLUS (SEPSIS): 500.0000 mL | Freq: Once | INTRAVENOUS | Status: DC

## 2012-11-22 NOTE — ED Notes (Signed)
Bedside CBG =[phone]

## 2012-11-22 NOTE — ED Notes (Signed)
[phone] Ardell Isaacs (POA)

## 2012-11-22 NOTE — ED Notes (Signed)
Unwrapped left leg. Multiple wounds in several stages and several locations leading up to knee.

## 2012-11-22 NOTE — ED Provider Notes (Signed)
CSN: [id number]     Arrival date & time 11/22/12  1928 History   First MD Initiated Contact with Patient 11/22/12 1934     Chief Complaint  Patient presents with  . Altered Mental Status   (Consider location/radiation/quality/duration/timing/severity/associated sxs/prior Treatment) Patient is a 72 y.o. male presenting with leg pain. The history is provided by the patient.  Leg Pain Location:  Foot Foot location:  L foot Pain details:    Quality:  Aching   Radiates to:  Does not radiate   Severity:  Moderate   Onset quality:  Gradual   Duration: months.   Timing:  Constant   Progression:  Worsening Chronicity:  Chronic Associated symptoms: no back pain, no fever and no neck pain     Past Medical History  Diagnosis Date  . Depression   . Hyperlipidemia   . Hypertension   . Chronic kidney disease   . CHF (congestive heart failure)   . Sleep apnea   . Gout   . Foot ulcer, left 06/04/2012    dorsum   . Type II diabetes mellitus   . Osteoarthritis     "plenty" (06/04/2012)  . Gout    Past Surgical History  Procedure Laterality Date  . Leg amputation below knee Right   . Orchiectomy Left     "I think it's the left" (06/04/2012)  . Shoulder open rotator cuff repair Left   . Inguinal hernia repair  1990's?    "? side" (06/04/2012)  . Cataract extraction w/ intraocular lens  implant, bilateral Bilateral ` 2010  . I&d extremity Left 06/05/2012    Procedure: IRRIGATION AND DEBRIDEMENT LT ANKLE ;  Surgeon: Nadara Mustard, MD;  Location: [facility];  Service: Orthopedics;  Laterality: Left;  . Application of wound vac Left 06/05/2012    Procedure: APPLICATION OF WOUND VAC;  Surgeon: Nadara Mustard, MD;  Location: [facility] OR;  Service: Orthopedics;  Laterality: Left;  . Apligraft placement Left 06/10/2012    Procedure: Left ankle skin graft;  Surgeon: Nadara Mustard, MD;  Location: [facility];  Service: Orthopedics;  Laterality: Left;  . Application of wound vac Left 06/10/2012    Procedure: APPLICATION OF  WOUND VAC;  Surgeon: Nadara Mustard, MD;  Location: [facility] OR;  Service: Orthopedics;  Laterality: Left;    Family History  Problem Relation Age of Onset  . Diabetes Brother   . Heart disease Mother   . Kidney disease Mother    History  Substance Use Topics  . Smoking status: Former Smoker -- 1.50 packs/day for 45 years    Types: Cigarettes    Quit date: 03/04/1997  . Smokeless tobacco: Never Used     Comment: 06/04/2012 "quit smoking 15 yr ago"  . Alcohol Use: No     Comment: 06/04/2012 "used to drink alot of whiskey; quit 30 yr ago"    Review of Systems  Constitutional: Negative for fever and chills.  HENT: Negative for neck pain.   Respiratory: Negative for cough, chest tightness and shortness of breath.   Cardiovascular: Negative for chest pain.  Gastrointestinal: Negative for nausea, vomiting, abdominal pain, diarrhea and blood in stool.  Genitourinary: Negative for dysuria, frequency, penile swelling, scrotal swelling, difficulty urinating and testicular pain.  Musculoskeletal: Negative for back pain.  Neurological: Negative for syncope, light-headedness and headaches.  All other systems reviewed and are negative.    Allergies  Review of patient's allergies indicates no known allergies.  Home Medications   Current Outpatient  Rx  Name  Route  Sig  Dispense  Refill  . allopurinol (ZYLOPRIM) 100 MG tablet      TAKE 1 TABLET BY MOUTH DAILY   90 tablet   3   . ascorbic acid (VITAMIN C) 1000 MG tablet   Oral   Take 1,000 mg by mouth daily.         Marland Kitchen diltiazem (DILACOR XR) 240 MG 24 hr capsule   Oral   Take 1 capsule (240 mg total) by mouth 2 (two) times daily.   180 capsule   3   . fenofibrate 160 MG tablet   Oral   Take 1 tablet (160 mg total) by mouth daily.   30 tablet   2     Please have the patient call the office   . FLUoxetine (PROZAC) 20 MG capsule      TAKE ONE CAPSULE BY MOUTH TWICE DAILY   60 capsule   2   . furosemide (LASIX) 20 MG tablet       TAKE 1 TABLET BY MOUTH EVERY DAY   30 tablet   2   . gabapentin (NEURONTIN) 300 MG capsule   Oral   Take 1 capsule (300 mg total) by mouth 3 (three) times daily as needed (for nerve pain.).   270 capsule   3   . insulin glargine (LANTUS) 100 UNIT/ML injection   Subcutaneous   Inject 0.3 mLs (30 Units total) into the skin 2 (two) times daily.   10 mL      . insulin lispro (HUMALOG) 100 UNIT/ML injection   Subcutaneous   Inject 25 Units into the skin 3 (three) times daily before meals.   10 mL   5   . levothyroxine (SYNTHROID, LEVOTHROID) 200 MCG tablet   Oral   Take 1 tablet (200 mcg total) by mouth daily before breakfast.   90 tablet   3   . Liraglutide (VICTOZA) 18 MG/3ML SOLN injection   Subcutaneous   Inject 1.2 mg into the skin daily.         . Multiple Vitamin (MULTIVITAMIN WITH MINERALS) TABS   Oral   Take 1 tablet by mouth daily.         Marland Kitchen nystatin (MYCOSTATIN) powder   Topical   Apply topically See admin instructions. Apply 2-3 times daily         . potassium chloride (K-DUR,KLOR-CON) 10 MEQ tablet   Oral   Take 10 mEq by mouth daily.         . potassium chloride (KLOR-CON 10) 10 MEQ tablet      TAKE 1 TABLET BY MOUTH DAILY   90 tablet   3   . rOPINIRole (REQUIP) 2 MG tablet      TAKE 1 TABLET BY MOUTH EVERY NIGHT AT BEDTIME   90 tablet   3   . rosuvastatin (CRESTOR) 20 MG tablet      TAKE 1 TABLET BY MOUTH EVERY DAY   90 tablet   0   . tamsulosin (FLOMAX) 0.4 MG CAPS capsule   Oral   Take 1 capsule (0.4 mg total) by mouth daily.   30 capsule   2   . VICODIN ES 7.5-300 MG TABS      TAKE 1 TABLET BY MOUTH EVERY 6 HOURS AS NEEDED   180 each   0   . warfarin (COUMADIN) 5 MG tablet   Oral   Take 1 tablet (5 mg total) by mouth daily.  90 tablet   0    BP 96/63  Pulse 111  Temp(Src) 98.3 F (36.8 C) (Oral)  Resp 26  Ht 6' (1.829 m)  Wt 350 lb (158.759 kg)  BMI 47.46 kg/m2  SpO2 93% Physical Exam  Vitals  reviewed. Constitutional: He is oriented to person, place, and time. He appears well-developed and well-nourished. No distress.  Pale Shorts with visible stool on them Sleeve over BKA with visible stool on it Morbidly obese  HENT:  Head: Normocephalic.  Right Ear: External ear normal.  Left Ear: External ear normal.  Nose: Nose normal.  Mouth/Throat: Oropharynx is clear and moist. No oropharyngeal exudate.  Eyes: Conjunctivae and EOM are normal. Pupils are equal, round, and reactive to light.  Neck: Normal range of motion. Neck supple.  Cardiovascular: Normal heart sounds and intact distal pulses.  An irregularly irregular rhythm present. Tachycardia present.  Exam reveals no gallop and no friction rub.   No murmur heard. Pulmonary/Chest: Effort normal and breath sounds normal.  Abdominal: Soft. Bowel sounds are normal. He exhibits distension. There is no tenderness. There is no rebound and no guarding.  Musculoskeletal: Normal range of motion. He exhibits no edema and no tenderness.  Neurological: He is alert and oriented to person, place, and time. He has normal strength. No cranial nerve deficit or sensory deficit.  Skin: Skin is warm and dry.  Psychiatric: He has a normal mood and affect.    ED Course  Procedures (including critical care time) Labs Review Labs Reviewed  CBC WITH DIFFERENTIAL - Abnormal; Notable for the following:    RBC 2.68 (*)    Hemoglobin 7.7 (*)    HCT 23.3 (*)    RDW 16.4 (*)    Neutrophils Relative % 86 (*)    Neutro Abs 8.5 (*)    Lymphocytes Relative 7 (*)    All other components within normal limits  COMPREHENSIVE METABOLIC PANEL - Abnormal; Notable for the following:    Sodium 126 (*)    Chloride 90 (*)    Glucose, Bld 269 (*)    BUN 48 (*)    Creatinine, Ser 2.99 (*)    Albumin 2.4 (*)    AST 65 (*)    Alkaline Phosphatase 36 (*)    GFR calc non Af Amer 19 (*)    GFR calc Af Amer 22 (*)    All other components within normal limits   SEDIMENTATION RATE - Abnormal; Notable for the following:    Sed Rate 140 (*)    All other components within normal limits  GLUCOSE, CAPILLARY - Abnormal; Notable for the following:    Glucose-Capillary 281 (*)    All other components within normal limits  CG4 I-STAT (LACTIC ACID) - Abnormal; Notable for the following:    Lactic Acid, Venous 2.46 (*)    All other components within normal limits  OCCULT BLOOD, POC DEVICE - Abnormal; Notable for the following:    Fecal Occult Bld POSITIVE (*)    All other components within normal limits  TROPONIN I  C-REACTIVE PROTEIN  URINALYSIS, ROUTINE W REFLEX MICROSCOPIC  TYPE AND SCREEN   Imaging Review Dg Foot Complete Left  11/22/2012   CLINICAL DATA:  Multiple foot wounds.  EXAM: LEFT FOOT - COMPLETE 3+ VIEW  COMPARISON:  Radiographs 06/03/2012. MRI 06/04/2012.  FINDINGS: The bones are diffusely demineralized. There is a large ulcer over the heel with resulting soft tissue emphysema and possible early destruction of the calcaneal tuberosity. No other  bone destruction is identified. There are posttraumatic deformities within the metatarsals with a pseudoarthrosis between the 3rd and 4th metatarsals. No other bone destruction is evident.  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   Electronically Signed   By: Roxy Horseman   On: 11/22/2012 21:59    Date: 11/22/2012  Rate: 103  Rhythm: atrial fibrillation  QRS Axis: normal  Intervals: normal  ST/T Wave abnormalities: nonspecific T wave changes in lateral leads  Conduction Disutrbances:none  Narrative Interpretation: a fib, lateral NSTWA, poor R wave progression  Old EKG Reviewed: none available   MDM   72 year old male with past medical history of hypertension, chronic kidney disease, CHF, right BKA and poorly controlled diabetes that presents with a chronic left leg and foot wound managed by [facility]  that now appears acutely infected, exam c/w wet gangrene.  He has failed outpatient wound VAC and wound care attempts.  Tachy, soft BPs.  Exam as above.  CBC, CMP, ESR/CRP, lactate, EKG, UA, foot xray, Vanc/Zosyn, Ortho consult, admit.  11:24 PM Additional fluid bolus given.  Hemoccult +, brown stool.  Pt will be admitted to the [facility].  [facility] will see the patient in the morning.  Clinical Impression: 1. Gangrene of foot   2. AKI (acute kidney injury)   3. Hyponatremia   4. Hypochloremia   5. Hyperglycemia     Disposition: Admit  Condition: Poor   I have discussed the results, Dx and Tx plan. They understand and agree with plan for admission.  Exam unchanged at admission.   Pt seen in conjunction with Dr. Jodi Mourning.  Reine Just. Beverely Pace, MD Emergency Medicine PGY-III [phone]   Oleh Genin, MD 11/22/12 9147  Oleh Genin, MD 11/23/12 [phone] Medical screening examination/treatment/procedure(s) were conducted as a shared visit with non-physician practitioner(s) or resident and myself. I personally evaluated the patient during the encounter and agree with the findings and plan unless otherwise indicated.  Known left foot ulcer/ gangrene. Followed by ortho outpt. Gradual worsening general weakness and confusion the past two days. Wound with discharge and surrounding redness. No current abx outpt. Pt has been told he may need amputation. Nothing improves sxs, pt has been really tired lately. Left heal gangrene with dorsal open ulceration/ open wound, mild discharge, mild left leg swelling/ warmth. Lungs clear but minimal effort, pt alert and follows commands but then goes back to sleep, he answers most questions, CNs grossly intact, dry mm.  Concern for wound infection/ early sepsis, broad abx, labs and admission.  Labs Reviewed   CBC WITH DIFFERENTIAL - Abnormal; Notable for the following:    RBC  2.68 (*)     Hemoglobin  7.7 (*)     HCT  23.3 (*)     RDW  16.4 (*)      Neutrophils Relative %  86 (*)     Neutro Abs  8.5 (*)     Lymphocytes Relative  7 (*)     All other components within normal limits   GLUCOSE, CAPILLARY - Abnormal; Notable for the following:    Glucose-Capillary  281 (*)     All other components within normal limits   CG4 I-STAT (LACTIC ACID) - Abnormal; Notable for the following:    Lactic Acid, Venous  2.46 (*)     All other components within normal limits   COMPREHENSIVE METABOLIC PANEL   SEDIMENTATION RATE   C-REACTIVE PROTEIN   TROPONIN I  URINALYSIS, ROUTINE W REFLEX MICROSCOPIC    Gangrene, AKI, Hyponatremia, Hyperglycemia, Cellulitis, confusion, Anemia  Enid Skeens, MD 11/23/12 2214

## 2012-11-22 NOTE — ED Notes (Signed)
Pt arrived from home via [facility]. EMS called to scene for Altered LOC and found pt [facility]. Left leg gangrene noted up to knee.

## 2012-11-22 NOTE — H&P (Addendum)
[facility] ADMISSION H&P  Chief Complaint: AMS/Worsening LLE wound  HPI: 72 yr old WM w/ pmhx significant for HTN, CKD stage 3 likely due to DM, hx R BKA, HL, Afib on coumadin, presents due to the above stated complaints. Per report, the patient has been having increasing weakness and confusion the past few days. He tells me the wounds on his LLE have been present for months, gradually worsening. He has seen orthopedics for this and has been getting home health.  On exam, he is noted to have impressive findings. He has wet gangrene noted on L heel with multiple ulcerations throughout his L foot, with discharge.  In the ED, he was noted to have a LA of 2.46, Hgb 7.7,  BP of 96/63 and there was an initial concern for early sepsis.  He was bolused with IVF and seems clinically improved and more awake.   His Cr is noted to be 2.99, last known to be 1.4 on 8/27.  He is also noted to have a Hgb of 7.7, last know 11.4 on 8/27. He denies any bleeding on my exam.  He was noted to have a +FOBT in the ED.   The ED discussed the case with [facility] orthopedics on call and they will be seeing him the morning.    Past Medical History  Diagnosis Date  . Depression   . Hyperlipidemia   . Hypertension   . Chronic kidney disease   . CHF (congestive heart failure)   . Sleep apnea   . Gout   . Foot ulcer, left 06/04/2012    dorsum   . Type II diabetes mellitus   . Osteoarthritis     "plenty" (06/04/2012)  . Gout     Past Surgical History  Procedure Laterality Date  . Leg amputation below knee Right   . Orchiectomy Left     "I think it's the left" (06/04/2012)  . Shoulder open rotator cuff repair Left   . Inguinal hernia repair  1990's?    "? side" (06/04/2012)  . Cataract extraction w/ intraocular lens  implant, bilateral Bilateral ` 2010  . I&d extremity Left 06/05/2012    Procedure: IRRIGATION AND DEBRIDEMENT LT ANKLE ;  Surgeon: Nadara Mustard, MD;  Location: [facility];  Service: Orthopedics;   Laterality: Left;  . Application of wound vac Left 06/05/2012    Procedure: APPLICATION OF WOUND VAC;  Surgeon: Nadara Mustard, MD;  Location: [facility];  Service: Orthopedics;  Laterality: Left;  . Apligraft placement Left 06/10/2012    Procedure: Left ankle skin graft;  Surgeon: Nadara Mustard, MD;  Location: [facility];  Service: Orthopedics;  Laterality: Left;  . Application of wound vac Left 06/10/2012    Procedure: APPLICATION OF WOUND VAC;  Surgeon: Nadara Mustard, MD;  Location: [facility];  Service: Orthopedics;  Laterality: Left;    Family History  Problem Relation Age of Onset  . Diabetes Brother   . Heart disease Mother   . Kidney disease Mother    Social History:  reports that he quit smoking about 15 years ago. His smoking use included Cigarettes. He has a 67.5 pack-year smoking history. He has never used smokeless tobacco. He reports that he does not drink alcohol or use illicit drugs.  Allergies: No Known Allergies   (Not in a hospital admission)  Results for orders placed during the hospital encounter of 11/22/12 (from the past 48 hour(s))  GLUCOSE, CAPILLARY     Status: Abnormal  Collection Time    11/22/12  7:49 PM      Result Value Range   Glucose-Capillary 281 (*) 70 - 99 mg/dL  CBC WITH DIFFERENTIAL     Status: Abnormal   Collection Time    11/22/12  8:26 PM      Result Value Range   WBC 9.9  4.0 - 10.5 K/uL   RBC 2.68 (*) 4.22 - 5.81 MIL/uL   Hemoglobin 7.7 (*) 13.0 - 17.0 g/dL   HCT 11.9 (*) 14.7 - 82.9 %   MCV 86.9  78.0 - 100.0 fL   MCH 28.7  26.0 - 34.0 pg   MCHC 33.0  30.0 - 36.0 g/dL   RDW 56.2 (*) 13.0 - 86.5 %   Platelets 200  150 - 400 K/uL   Neutrophils Relative % 86 (*) 43 - 77 %   Neutro Abs 8.5 (*) 1.7 - 7.7 K/uL   Lymphocytes Relative 7 (*) 12 - 46 %   Lymphs Abs 0.7  0.7 - 4.0 K/uL   Monocytes Relative 7  3 - 12 %   Monocytes Absolute 0.7  0.1 - 1.0 K/uL   Eosinophils Relative 0  0 - 5 %   Eosinophils Absolute 0.0  0.0 - 0.7 K/uL   Basophils Relative 0   0 - 1 %   Basophils Absolute 0.0  0.0 - 0.1 K/uL  COMPREHENSIVE METABOLIC PANEL     Status: Abnormal   Collection Time    11/22/12  8:26 PM      Result Value Range   Sodium 126 (*) 135 - 145 mEq/L   Potassium 4.1  3.5 - 5.1 mEq/L   Chloride 90 (*) 96 - 112 mEq/L   CO2 23  19 - 32 mEq/L   Glucose, Bld 269 (*) 70 - 99 mg/dL   BUN 48 (*) 6 - 23 mg/dL   Creatinine, Ser 7.84 (*) 0.50 - 1.35 mg/dL   Calcium 8.5  8.4 - 69.6 mg/dL   Total Protein 6.8  6.0 - 8.3 g/dL   Albumin 2.4 (*) 3.5 - 5.2 g/dL   AST 65 (*) 0 - 37 U/L   ALT 18  0 - 53 U/L   Alkaline Phosphatase 36 (*) 39 - 117 U/L   Total Bilirubin 0.7  0.3 - 1.2 mg/dL   GFR calc non Af Amer 19 (*) >90 mL/min   GFR calc Af Amer 22 (*) >90 mL/min   Comment: (NOTE)     The eGFR has been calculated using the CKD EPI equation.     This calculation has not been validated in all clinical situations.     eGFR's persistently <90 mL/min signify possible Chronic Kidney     Disease.  SEDIMENTATION RATE     Status: Abnormal   Collection Time    11/22/12  8:26 PM      Result Value Range   Sed Rate 140 (*) 0 - 16 mm/hr  TROPONIN I     Status: None   Collection Time    11/22/12  8:26 PM      Result Value Range   Troponin I <0.30  <0.30 ng/mL   Comment:            Due to the release kinetics of cTnI,     a negative result within the first hours     of the onset of symptoms does not rule out     myocardial infarction with certainty.     If  myocardial infarction is still suspected,     repeat the test at appropriate intervals.  CG4 I-STAT (LACTIC ACID)     Status: Abnormal   Collection Time    11/22/12  8:36 PM      Result Value Range   Lactic Acid, Venous 2.46 (*) 0.5 - 2.2 mmol/L  OCCULT BLOOD, POC DEVICE     Status: Abnormal   Collection Time    11/22/12  9:10 PM      Result Value Range   Fecal Occult Bld POSITIVE (*) NEGATIVE   Dg Foot Complete Left  11/22/2012   CLINICAL DATA:  Multiple foot wounds.  EXAM: LEFT FOOT - COMPLETE  3+ VIEW  COMPARISON:  Radiographs 06/03/2012. MRI 06/04/2012.  FINDINGS: The bones are diffusely demineralized. There is a large ulcer over the heel with resulting soft tissue emphysema and possible early destruction of the calcaneal tuberosity. No other bone destruction is identified. There are posttraumatic deformities within the metatarsals with a pseudoarthrosis between the 3rd and 4th metatarsals. No other bone destruction is evident.  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   Electronically Signed   By: Roxy Horseman   On: 11/22/2012 21:59    Review of Systems  Constitutional: Positive for malaise/fatigue. Negative for fever and chills.  Eyes: Negative for photophobia.  Respiratory: Negative for shortness of breath.   Cardiovascular: Negative for chest pain, palpitations and PND.  Gastrointestinal: Negative for nausea, vomiting, abdominal pain, blood in stool and melena.  Genitourinary: Negative for dysuria.  Neurological: Negative for dizziness, loss of consciousness and headaches.    Blood pressure 95/65, pulse 105, temperature 98.3 F (36.8 C), temperature source Oral, resp. rate 22, height 6' (1.829 m), weight 350 lb (158.759 kg), SpO2 93.00%. Physical Exam  Constitutional: He is oriented to person, place, and time. No distress.  HENT:  Head: Normocephalic and atraumatic.  Eyes: Conjunctivae and EOM are normal. Pupils are equal, round, and reactive to light.  Neck: Normal range of motion. No JVD present. No thyromegaly present.  Cardiovascular: Normal rate and normal heart sounds.  Exam reveals no friction rub.   No murmur heard. Respiratory: Effort normal and breath sounds normal. No respiratory distress.  GI: Soft. Bowel sounds are normal. He exhibits no distension. There is no guarding.  Musculoskeletal: He exhibits edema.       Feet:  Neurological: He is alert and oriented to person, place, and  time. No cranial nerve deficit.  Skin: Skin is warm. He is not diaphoretic.     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 I will start him on Vancomycin and Zosyn. BC will be obtained. Orthopedics have already been consulted by the ED. Unfortunately, this infection appears to be severe enough to likely need amputation. 2) Acute on chronic anemia: type and cross. Will hold coumadin at this time. I don't think he's hemolyzing, his Bili is normal.  Given the likely need for surgery, will transfuse 2 units of PRBCs at this time. In the AM, may need to consider GI consult to evaluate GI source of bleeding. Repeat H/H in the AM. 3) Afib: Hold coumadin in light of possible bleed. I need an INR, so I will order this stat. 4) IDDM type 2:  ISS. Decrease lantus to 15 units [place] bid. Monitor BG. 5) AKI on CKD 3: Likely due to above infection in addition to taking home lasix with decreased PO intake. He is pre-renal. IVF for now. Monitor UOP. 6) Proph: SCDs for now. 7) FEN: NPO due to likely plans to take to OR. 8) Code: FULL  Jonah Blue, DO, FACP 11/22/2012, 11:53 PM  ADDENDUM: INR is 2.2. I will give him 5 mg PO vit K in case he is taken to OR tomorrow, especially given his +FOBT with decreased H/H.  Jonah Blue, DO, FACP 11/23/2012, 3:00 AM

## 2012-11-22 NOTE — ED Notes (Signed)
Put urinal at bedside told family we need urine specimen pt sleeping at this time. Family stated that would let us know if he needed to urinate.

## 2012-11-22 NOTE — ED Notes (Signed)
MD at bedside. 

## 2012-11-23 ENCOUNTER — Other Ambulatory Visit (HOSPITAL_COMMUNITY): Payer: Self-pay | Admitting: Orthopedic Surgery

## 2012-11-23 ENCOUNTER — Encounter (HOSPITAL_COMMUNITY): Payer: Self-pay | Admitting: General Practice

## 2012-11-23 LAB — COMPREHENSIVE METABOLIC PANEL
ALT: 17 U/L (ref 0–53)
Alkaline Phosphatase: 32 U/L — ABNORMAL LOW (ref 39–117)
BUN: 49 mg/dL — ABNORMAL HIGH (ref 6–23)
CO2: 27 mEq/L (ref 19–32)
Calcium: 8 mg/dL — ABNORMAL LOW (ref 8.4–10.5)
GFR calc Af Amer: 24 mL/min — ABNORMAL LOW (ref >90)
GFR calc non Af Amer: 21 mL/min — ABNORMAL LOW (ref >90)
Glucose, Bld: 200 mg/dL — ABNORMAL HIGH (ref 70–99)
Potassium: 3.7 mEq/L (ref 3.5–5.1)
Sodium: 127 mEq/L — ABNORMAL LOW (ref 135–145)
Total Bilirubin: 0.7 mg/dL (ref 0.3–1.2)

## 2012-11-23 LAB — GLUCOSE, CAPILLARY
Glucose-Capillary: 200 mg/dL — ABNORMAL HIGH (ref 70–99)
Glucose-Capillary: 159 mg/dL — ABNORMAL HIGH (ref 70–99)
Glucose-Capillary: 245 mg/dL — ABNORMAL HIGH (ref 70–99)
Glucose-Capillary: 258 mg/dL — ABNORMAL HIGH (ref 70–99)

## 2012-11-23 LAB — URINALYSIS, ROUTINE W REFLEX MICROSCOPIC
Bilirubin Urine: NEGATIVE
Glucose, UA: 100 mg/dL — AB
Ketones, ur: NEGATIVE mg/dL
Protein, ur: NEGATIVE mg/dL
Urobilinogen, UA: 1 mg/dL (ref 0.0–1.0)
pH: 5 (ref 5.0–8.0)

## 2012-11-23 LAB — CBC
Hemoglobin: 7.3 g/dL — ABNORMAL LOW (ref 13.0–17.0)
MCHC: 32.9 g/dL (ref 30.0–36.0)
MCV: 86.7 fL (ref 78.0–100.0)
Platelets: 211 10*3/uL (ref 150–400)
RBC: 2.56 MIL/uL — ABNORMAL LOW (ref 4.22–5.81)
RDW: 16.4 % — ABNORMAL HIGH (ref 11.5–15.5)

## 2012-11-23 LAB — C-REACTIVE PROTEIN: CRP: 29.2 mg/dL — ABNORMAL HIGH (ref <0.60)

## 2012-11-23 LAB — URINE MICROSCOPIC-ADD ON

## 2012-11-23 LAB — PROTIME-INR
INR: 2.21 — ABNORMAL HIGH (ref 0.00–1.49)
Prothrombin Time: 23.8 seconds — ABNORMAL HIGH (ref 11.6–15.2)
Prothrombin Time: 24 seconds — ABNORMAL HIGH (ref 11.6–15.2)

## 2012-11-23 LAB — LACTIC ACID, PLASMA: Lactic Acid, Venous: 1.6 mmol/L (ref 0.5–2.2)

## 2012-11-23 MED ORDER — SODIUM CHLORIDE 0.9 % IV SOLN
2000.0000 mg | INTRAVENOUS | Status: DC
  Administered 2012-11-24 – 2012-11-25 (×2): 2000 mg via INTRAVENOUS
  Filled 2012-11-23 (×3): qty 2000

## 2012-11-23 MED ORDER — GABAPENTIN 300 MG PO CAPS
300.0000 mg | ORAL_CAPSULE | Freq: Three times a day (TID) | ORAL | Status: DC | PRN
  Filled 2012-11-23: qty 1

## 2012-11-23 MED ORDER — VANCOMYCIN HCL IN DEXTROSE 1-5 GM/200ML-% IV SOLN
1000.0000 mg | Freq: Once | INTRAVENOUS | Status: AC
  Administered 2012-11-23: 1000 mg via INTRAVENOUS
  Filled 2012-11-23: qty 200

## 2012-11-23 MED ORDER — PHYTONADIONE 5 MG PO TABS
5.0000 mg | ORAL_TABLET | Freq: Once | ORAL | Status: AC
  Administered 2012-11-23: 5 mg via ORAL
  Filled 2012-11-23: qty 1

## 2012-11-23 MED ORDER — SODIUM CHLORIDE 0.9 % IJ SOLN
3.0000 mL | Freq: Two times a day (BID) | INTRAMUSCULAR | Status: DC
  Administered 2012-11-23 – 2012-11-25 (×6): 3 mL via INTRAVENOUS

## 2012-11-23 MED ORDER — VANCOMYCIN HCL IN DEXTROSE 1-5 GM/200ML-% IV SOLN
1000.0000 mg | INTRAVENOUS | Status: DC
  Administered 2012-11-23: 1000 mg via INTRAVENOUS
  Filled 2012-11-23: qty 200

## 2012-11-23 MED ORDER — INSULIN GLARGINE 100 UNIT/ML ~~LOC~~ SOLN
15.0000 [IU] | Freq: Two times a day (BID) | SUBCUTANEOUS | Status: DC
  Administered 2012-11-23 – 2012-11-27 (×10): 15 [IU] via SUBCUTANEOUS
  Filled 2012-11-23 (×11): qty 0.15

## 2012-11-23 MED ORDER — SODIUM CHLORIDE 0.9 % IV SOLN
INTRAVENOUS | Status: AC
  Administered 2012-11-23: 20:00:00 via INTRAVENOUS
  Administered 2012-11-23: 100 mL/h via INTRAVENOUS

## 2012-11-23 MED ORDER — LEVOTHYROXINE SODIUM 200 MCG PO TABS
200.0000 ug | ORAL_TABLET | Freq: Every day | ORAL | Status: DC
  Administered 2012-11-23 – 2012-11-27 (×5): 200 ug via ORAL
  Filled 2012-11-23 (×7): qty 1

## 2012-11-23 MED ORDER — PHYTONADIONE 5 MG PO TABS
10.0000 mg | ORAL_TABLET | Freq: Once | ORAL | Status: DC
  Filled 2012-11-23: qty 2

## 2012-11-23 MED ORDER — DILTIAZEM HCL ER 180 MG PO CP24
180.0000 mg | ORAL_CAPSULE | Freq: Two times a day (BID) | ORAL | Status: DC
  Administered 2012-11-23 – 2012-11-27 (×9): 180 mg via ORAL
  Filled 2012-11-23 (×10): qty 1

## 2012-11-23 MED ORDER — ALLOPURINOL 100 MG PO TABS
100.0000 mg | ORAL_TABLET | Freq: Every day | ORAL | Status: DC
  Administered 2012-11-23 – 2012-11-27 (×5): 100 mg via ORAL
  Filled 2012-11-23 (×5): qty 1

## 2012-11-23 MED ORDER — DILTIAZEM HCL ER 240 MG PO CP24
240.0000 mg | ORAL_CAPSULE | Freq: Two times a day (BID) | ORAL | Status: DC
  Filled 2012-11-23 (×3): qty 1

## 2012-11-23 MED ORDER — PIPERACILLIN-TAZOBACTAM 3.375 G IVPB
3.3750 g | Freq: Three times a day (TID) | INTRAVENOUS | Status: DC
  Administered 2012-11-23 – 2012-11-26 (×10): 3.375 g via INTRAVENOUS
  Filled 2012-11-23 (×15): qty 50

## 2012-11-23 MED ORDER — INFLUENZA VAC SPLIT QUAD 0.5 ML IM SUSP
0.5000 mL | INTRAMUSCULAR | Status: AC
  Filled 2012-11-23: qty 0.5

## 2012-11-23 MED ORDER — INSULIN ASPART 100 UNIT/ML ~~LOC~~ SOLN
0.0000 [IU] | SUBCUTANEOUS | Status: DC
  Administered 2012-11-23: 5 [IU] via SUBCUTANEOUS
  Administered 2012-11-23: 3 [IU] via SUBCUTANEOUS
  Administered 2012-11-23: 5 [IU] via SUBCUTANEOUS
  Administered 2012-11-23: 3 [IU] via SUBCUTANEOUS
  Administered 2012-11-23 – 2012-11-25 (×7): 2 [IU] via SUBCUTANEOUS
  Administered 2012-11-25: 3 [IU] via SUBCUTANEOUS
  Administered 2012-11-25: 2 [IU] via SUBCUTANEOUS
  Administered 2012-11-25: 3 [IU] via SUBCUTANEOUS
  Administered 2012-11-25: 5 [IU] via SUBCUTANEOUS
  Administered 2012-11-26: 2 [IU] via SUBCUTANEOUS
  Administered 2012-11-26: 1 [IU] via SUBCUTANEOUS

## 2012-11-23 MED ORDER — CHLORHEXIDINE GLUCONATE 4 % EX LIQD
60.0000 mL | Freq: Once | CUTANEOUS | Status: AC
  Administered 2012-11-24: 4 via TOPICAL
  Filled 2012-11-23: qty 60

## 2012-11-23 MED ORDER — JUVEN PO PACK
1.0000 | PACK | Freq: Two times a day (BID) | ORAL | Status: DC
  Administered 2012-11-23 – 2012-11-27 (×5): 1 via ORAL
  Filled 2012-11-23 (×9): qty 1

## 2012-11-23 MED ORDER — ATORVASTATIN CALCIUM 40 MG PO TABS
40.0000 mg | ORAL_TABLET | Freq: Every day | ORAL | Status: DC
  Administered 2012-11-23 – 2012-11-26 (×3): 40 mg via ORAL
  Filled 2012-11-23 (×5): qty 1

## 2012-11-23 NOTE — Progress Notes (Addendum)
ANTIBIOTIC CONSULT NOTE - INITIAL  Pharmacy Consult for Vancomycin and Zosyn  Indication: gangrene  No Known Allergies  Patient Measurements: Height: 6' (182.9 cm) Weight: 350 lb (158.759 kg) IBW/kg (Calculated) : 77.6 Adjusted Body Weight: 110 kg  Vital Signs: Temp: 98.3 F (36.8 C) (09/21 1934) Temp src: Oral (09/21 1934) BP: 95/65 mmHg (09/21 2325) Pulse Rate: 105 (09/21 2045) Intake/Output from previous day: 09/21 0701 - 09/22 0700 In: 550 [I.V.:550] Out: -  Intake/Output from this shift: Total I/O In: 550 [I.V.:550] Out: -   Labs:  Recent Labs  11/22/12 2026  WBC 9.9  HGB 7.7*  PLT 200  CREATININE 2.99*   Estimated Creatinine Clearance: 34.3 ml/min (by C-G formula based on Cr of 2.99). No results found for this basename: VANCOTROUGH, VANCOPEAK, VANCORANDOM, GENTTROUGH, GENTPEAK, GENTRANDOM, TOBRATROUGH, TOBRAPEAK, TOBRARND, AMIKACINPEAK, AMIKACINTROU, AMIKACIN,  in the last 72 hours   Microbiology: No results found for this or any previous visit (from the past 720 hour(s)).  Medical History: Past Medical History  Diagnosis Date  . Depression   . Hyperlipidemia   . Hypertension   . Chronic kidney disease   . CHF (congestive heart failure)   . Sleep apnea   . Gout   . Foot ulcer, left 06/04/2012    dorsum   . Type II diabetes mellitus   . Osteoarthritis     "plenty" (06/04/2012)  . Gout     Medications:  Prescriptions prior to admission  Medication Sig Dispense Refill  . allopurinol (ZYLOPRIM) 100 MG tablet Take 100 mg by mouth daily.      Marland Kitchen ascorbic acid (VITAMIN C) 1000 MG tablet Take 1,000 mg by mouth daily.      Marland Kitchen diltiazem (DILACOR XR) 240 MG 24 hr capsule Take 1 capsule (240 mg total) by mouth 2 (two) times daily.  180 capsule  3  . fenofibrate 160 MG tablet Take 1 tablet (160 mg total) by mouth daily.  30 tablet  2  . furosemide (LASIX) 20 MG tablet Take 20 mg by mouth.      . gabapentin (NEURONTIN) 300 MG capsule Take 1 capsule (300 mg  total) by mouth 3 (three) times daily as needed (for nerve pain.).  270 capsule  3  . Hydrocodone-Acetaminophen (VICODIN ES) 7.5-300 MG TABS Take 1 tablet by mouth every 6 (six) hours as needed (for pain).      . insulin glargine (LANTUS) 100 UNIT/ML injection Inject 0.3 mLs (30 Units total) into the skin 2 (two) times daily.  10 mL    . insulin lispro (HUMALOG) 100 UNIT/ML injection Inject 25 Units into the skin 3 (three) times daily before meals.  10 mL  5  . levothyroxine (SYNTHROID, LEVOTHROID) 200 MCG tablet Take 1 tablet (200 mcg total) by mouth daily before breakfast.  90 tablet  3  . Liraglutide (VICTOZA) 18 MG/3ML SOLN injection Inject 1.2 mg into the skin daily.      . Multiple Vitamin (MULTIVITAMIN WITH MINERALS) TABS Take 1 tablet by mouth daily.      Marland Kitchen nystatin (MYCOSTATIN) powder Apply 1 g topically See admin instructions. Apply 2-3 times daily      . potassium chloride (K-DUR,KLOR-CON) 10 MEQ tablet Take 10 mEq by mouth daily.      Marland Kitchen rOPINIRole (REQUIP) 2 MG tablet Take 2 mg by mouth at bedtime.      . rosuvastatin (CRESTOR) 20 MG tablet Take 20 mg by mouth daily.      . silver sulfADIAZINE (SILVADENE)  1 % cream Apply 1 application topically 2 (two) times daily.      . tamsulosin (FLOMAX) 0.4 MG CAPS capsule Take 1 capsule (0.4 mg total) by mouth daily.  30 capsule  2  . warfarin (COUMADIN) 5 MG tablet Take 1 tablet (5 mg total) by mouth daily.  90 tablet  0   Assessment: 72 yo male with LLE gangrene and possible osteomyelitis for empiric antibiotics.  Vancomycin 2 g IV given in ED at  2200   Goal of Therapy:  Vancomycin trough level 15-20 mcg/ml  Plan:  Vancomycin 1 g IV q24h, next dose at 0600 Zosyn 3.375 g IV q8h   Eddie Candle 11/23/2012,1:08 AM   Addendum:  Noted adjustment in weight.  Will change Vancomycin dose to 2gm IV q24h.  [employer], Pharm.D., BCPS Clinical Pharmacist Pager [phone] 11/23/2012 11:10 AM

## 2012-11-23 NOTE — Progress Notes (Signed)
New dressings placed on sacrum. Pt tolerated well. No drainage noted. Will continue to monitor.

## 2012-11-23 NOTE — Consult Note (Signed)
Reason for Consult:gangrene left foot Referring Physician: Dr Phoebe Sharps is an 72 y.o. male.  HPI: patient is a 72 year old gentleman well known to me who is status post a right transtibial amputation. Patient has had a prolonged history of gangrene of the left foot. Patient is failed foot salvage intervention has previously refused amputation ,has not shown for his previous office appointments, for the left lower extremity and presents at this time with progressive gangrenous changes of the left foot.  Past Medical History  Diagnosis Date  . Depression   . Hyperlipidemia   . Hypertension   . Chronic kidney disease   . CHF (congestive heart failure)   . Sleep apnea   . Gout   . Foot ulcer, left 06/04/2012    dorsum   . Type II diabetes mellitus   . Osteoarthritis     "plenty" (06/04/2012)  . Gout     Past Surgical History  Procedure Laterality Date  . Leg amputation below knee Right   . Orchiectomy Left     "I think it's the left" (06/04/2012)  . Shoulder open rotator cuff repair Left   . Inguinal hernia repair  1990's?    "? side" (06/04/2012)  . Cataract extraction w/ intraocular lens  implant, bilateral Bilateral ` 2010  . I&d extremity Left 06/05/2012    Procedure: IRRIGATION AND DEBRIDEMENT LT ANKLE ;  Surgeon: Nadara Mustard, MD;  Location: [facility] OR;  Service: Orthopedics;  Laterality: Left;  . Application of wound vac Left 06/05/2012    Procedure: APPLICATION OF WOUND VAC;  Surgeon: Nadara Mustard, MD;  Location: [facility] OR;  Service: Orthopedics;  Laterality: Left;  . Apligraft placement Left 06/10/2012    Procedure: Left ankle skin graft;  Surgeon: Nadara Mustard, MD;  Location: [facility] OR;  Service: Orthopedics;  Laterality: Left;  . Application of wound vac Left 06/10/2012    Procedure: APPLICATION OF WOUND VAC;  Surgeon: Nadara Mustard, MD;  Location: [facility] OR;  Service: Orthopedics;  Laterality: Left;    Family History  Problem Relation Age of Onset  . Diabetes Brother   . Heart  disease Mother   . Kidney disease Mother     Social History:  reports that he quit smoking about 15 years ago. His smoking use included Cigarettes. He has a 67.5 pack-year smoking history. He has never used smokeless tobacco. He reports that he does not drink alcohol or use illicit drugs.  Allergies: No Known Allergies  Medications: I have reviewed the patient's current medications.  Results for orders placed during the hospital encounter of 11/22/12 (from the past 48 hour(s))  GLUCOSE, CAPILLARY     Status: Abnormal   Collection Time    11/22/12  7:49 PM      Result Value Range   Glucose-Capillary 281 (*) 70 - 99 mg/dL  CBC WITH DIFFERENTIAL     Status: Abnormal   Collection Time    11/22/12  8:26 PM      Result Value Range   WBC 9.9  4.0 - 10.5 K/uL   RBC 2.68 (*) 4.22 - 5.81 MIL/uL   Hemoglobin 7.7 (*) 13.0 - 17.0 g/dL   HCT 16.1 (*) 09.6 - 04.5 %   MCV 86.9  78.0 - 100.0 fL   MCH 28.7  26.0 - 34.0 pg   MCHC 33.0  30.0 - 36.0 g/dL   RDW 40.9 (*) 81.1 - 91.4 %   Platelets 200  150 - 400  K/uL   Neutrophils Relative % 86 (*) 43 - 77 %   Neutro Abs 8.5 (*) 1.7 - 7.7 K/uL   Lymphocytes Relative 7 (*) 12 - 46 %   Lymphs Abs 0.7  0.7 - 4.0 K/uL   Monocytes Relative 7  3 - 12 %   Monocytes Absolute 0.7  0.1 - 1.0 K/uL   Eosinophils Relative 0  0 - 5 %   Eosinophils Absolute 0.0  0.0 - 0.7 K/uL   Basophils Relative 0  0 - 1 %   Basophils Absolute 0.0  0.0 - 0.1 K/uL  COMPREHENSIVE METABOLIC PANEL     Status: Abnormal   Collection Time    11/22/12  8:26 PM      Result Value Range   Sodium 126 (*) 135 - 145 mEq/L   Potassium 4.1  3.5 - 5.1 mEq/L   Chloride 90 (*) 96 - 112 mEq/L   CO2 23  19 - 32 mEq/L   Glucose, Bld 269 (*) 70 - 99 mg/dL   BUN 48 (*) 6 - 23 mg/dL   Creatinine, Ser 1.61 (*) 0.50 - 1.35 mg/dL   Calcium 8.5  8.4 - 09.6 mg/dL   Total Protein 6.8  6.0 - 8.3 g/dL   Albumin 2.4 (*) 3.5 - 5.2 g/dL   AST 65 (*) 0 - 37 U/L   ALT 18  0 - 53 U/L   Alkaline  Phosphatase 36 (*) 39 - 117 U/L   Total Bilirubin 0.7  0.3 - 1.2 mg/dL   GFR calc non Af Amer 19 (*) >90 mL/min   GFR calc Af Amer 22 (*) >90 mL/min   Comment: (NOTE)     The eGFR has been calculated using the CKD EPI equation.     This calculation has not been validated in all clinical situations.     eGFR's persistently <90 mL/min signify possible Chronic Kidney     Disease.  SEDIMENTATION RATE     Status: Abnormal   Collection Time    11/22/12  8:26 PM      Result Value Range   Sed Rate 140 (*) 0 - 16 mm/hr  TROPONIN I     Status: None   Collection Time    11/22/12  8:26 PM      Result Value Range   Troponin I <0.30  <0.30 ng/mL   Comment:            Due to the release kinetics of cTnI,     a negative result within the first hours     of the onset of symptoms does not rule out     myocardial infarction with certainty.     If myocardial infarction is still suspected,     repeat the test at appropriate intervals.  CG4 I-STAT (LACTIC ACID)     Status: Abnormal   Collection Time    11/22/12  8:36 PM      Result Value Range   Lactic Acid, Venous 2.46 (*) 0.5 - 2.2 mmol/L  OCCULT BLOOD, POC DEVICE     Status: Abnormal   Collection Time    11/22/12  9:10 PM      Result Value Range   Fecal Occult Bld POSITIVE (*) NEGATIVE  TYPE AND SCREEN     Status: None   Collection Time    11/22/12 11:38 PM      Result Value Range   ABO/RH(D) O POS     Antibody Screen NEG  Sample Expiration 11/25/2012     Unit Number [id number]     Blood Component Type RED CELLS,LR     Unit division 00     Status of Unit ISSUED     Transfusion Status OK TO TRANSFUSE     Crossmatch Result Compatible     Unit Number [id number]     Blood Component Type RED CELLS,LR     Unit division 00     Status of Unit ISSUED     Transfusion Status OK TO TRANSFUSE     Crossmatch Result Compatible    PREPARE RBC (CROSSMATCH)     Status: None   Collection Time    11/22/12 11:38 PM      Result Value Range    Order Confirmation ORDER PROCESSED BY BLOOD BANK    PROTIME-INR     Status: Abnormal   Collection Time    11/23/12 12:08 AM      Result Value Range   Prothrombin Time 24.0 (*) 11.6 - 15.2 seconds   INR 2.23 (*) 0.00 - 1.49  GLUCOSE, CAPILLARY     Status: Abnormal   Collection Time    11/23/12  1:15 AM      Result Value Range   Glucose-Capillary 200 (*) 70 - 99 mg/dL  MRSA PCR SCREENING     Status: None   Collection Time    11/23/12  1:35 AM      Result Value Range   MRSA by PCR NEGATIVE  NEGATIVE   Comment:            The GeneXpert MRSA Assay (FDA     approved for NASAL specimens     only), is one component of a     comprehensive MRSA colonization     surveillance program. It is not     intended to diagnose MRSA     infection nor to guide or     monitor treatment for     MRSA infections.  COMPREHENSIVE METABOLIC PANEL     Status: Abnormal   Collection Time    11/23/12  2:10 AM      Result Value Range   Sodium 127 (*) 135 - 145 mEq/L   Potassium 3.7  3.5 - 5.1 mEq/L   Chloride 92 (*) 96 - 112 mEq/L   CO2 27  19 - 32 mEq/L   Glucose, Bld 200 (*) 70 - 99 mg/dL   BUN 49 (*) 6 - 23 mg/dL   Creatinine, Ser 9.62 (*) 0.50 - 1.35 mg/dL   Calcium 8.0 (*) 8.4 - 10.5 mg/dL   Total Protein 6.6  6.0 - 8.3 g/dL   Albumin 2.4 (*) 3.5 - 5.2 g/dL   AST 52 (*) 0 - 37 U/L   ALT 17  0 - 53 U/L   Alkaline Phosphatase 32 (*) 39 - 117 U/L   Total Bilirubin 0.7  0.3 - 1.2 mg/dL   GFR calc non Af Amer 21 (*) >90 mL/min   GFR calc Af Amer 24 (*) >90 mL/min   Comment: (NOTE)     The eGFR has been calculated using the CKD EPI equation.     This calculation has not been validated in all clinical situations.     eGFR's persistently <90 mL/min signify possible Chronic Kidney     Disease.  CBC     Status: Abnormal   Collection Time    11/23/12  2:10 AM      Result Value Range   WBC  7.6  4.0 - 10.5 K/uL   RBC 2.56 (*) 4.22 - 5.81 MIL/uL   Hemoglobin 7.3 (*) 13.0 - 17.0 g/dL   HCT 81.1 (*)  91.4 - 52.0 %   MCV 86.7  78.0 - 100.0 fL   MCH 28.5  26.0 - 34.0 pg   MCHC 32.9  30.0 - 36.0 g/dL   RDW 78.2 (*) 95.6 - 21.3 %   Platelets 211  150 - 400 K/uL  PROTIME-INR     Status: Abnormal   Collection Time    11/23/12  2:10 AM      Result Value Range   Prothrombin Time 23.8 (*) 11.6 - 15.2 seconds   INR 2.21 (*) 0.00 - 1.49  LACTIC ACID, PLASMA     Status: None   Collection Time    11/23/12  2:10 AM      Result Value Range   Lactic Acid, Venous 1.6  0.5 - 2.2 mmol/L  GLUCOSE, CAPILLARY     Status: Abnormal   Collection Time    11/23/12  4:53 AM      Result Value Range   Glucose-Capillary 159 (*) 70 - 99 mg/dL   Comment 1 Notify RN      Dg Foot Complete Left  11/22/2012   CLINICAL DATA:  Multiple foot wounds.  EXAM: LEFT FOOT - COMPLETE 3+ VIEW  COMPARISON:  Radiographs 06/03/2012. MRI 06/04/2012.  FINDINGS: The bones are diffusely demineralized. There is a large ulcer over the heel with resulting soft tissue emphysema and possible early destruction of the calcaneal tuberosity. No other bone destruction is identified. There are posttraumatic deformities within the metatarsals with a pseudoarthrosis between the 3rd and 4th metatarsals. No other bone destruction is evident.  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   Electronically Signed   By: Roxy Horseman   On: 11/22/2012 21:59    Review of Systems  All other systems reviewed and are negative.   Blood pressure 105/46, pulse 90, temperature 98.5 F (36.9 C), temperature source Axillary, resp. rate 22, height 6' (1.829 m), weight 158.759 kg (350 lb), SpO2 94.00%. Physical Exam On examination patient has gangrene of the entire heel pad with gangrenous ulcerations of the forefoot as well. There is no cellulitis or infection of the proximal third of the leg. Assessment/Plan: Assessment: Gangrene left foot.  Plan: Patient has started on a  transfusion for his anemia and has started on antibiotics for the infection. We will plan for surgical intervention tomorrow once patient is stabilized. Risks and benefits of surgery were discussed including persistent infection nonhealing of the wound need for higher level amputation. Patient states he understands was to proceed at this time.  Patient will need skilled nursing placement at discharge.  Ezrah Panning V 11/23/2012, 6:42 AM

## 2012-11-23 NOTE — Progress Notes (Signed)
Utilization review complete. Michah Minton RN CCM Case Mgmt  

## 2012-11-23 NOTE — Care Management Note (Signed)
    Page 1 of 1   11/27/2012     3:57:23 PM   CARE MANAGEMENT NOTE 11/27/2012  Patient:  [name], [name]   Account Number:  [id number]  Date Initiated:  11/23/2012  Documentation initiated by:  Aws Shere  Subjective/Objective Assessment:   PT ADM ON 11/22/12 WITH GANGRENOUS LT FOOT.  PTA, PT RESIDED AT HOME ALONE.  PT'S DAUGHTER IS P.O.A.     Action/Plan:   PLANNING AMPUTATION ON 11/24/12.  PT WILL NEED SNF AT DC; WILL CONSULT CSW TO FACILITATE THIS WHEN MEDICALLY STABLE.   Anticipated DC Date:  11/27/2012   Anticipated DC Plan:  SKILLED NURSING FACILITY  In-house referral  Clinical Social Worker      DC Planning Services  CM consult      Choice offered to / List presented to:             Status of service:  Completed, signed off Medicare Important Message given?   (If response is "NO", the following Medicare IM given date fields will be blank) Date Medicare IM given:   Date Additional Medicare IM given:    Discharge Disposition:  SKILLED NURSING FACILITY  Per UR Regulation:  Reviewed for med. necessity/level of care/duration of stay  If discussed at Long Length of Stay Meetings, dates discussed:    Comments:  11/27/12 Emanie Behan,RN,BSN [phone] PT FOR DC TO SNF TODAY, PER CSW ARRANGEMENTS.

## 2012-11-23 NOTE — Consult Note (Signed)
WOC consult Note Reason for Consult: Gangrene Left lower leg.  Pressure ulcers to coccyx, buttock and Upper posterior thigh. 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  Pressure Ulcer POA: Yes Measurement: 6 scattered areas, measuring 0.5 cm x 0.5 cm x 0.1 cm each to coccyx, buttock and posterior thigh. Gangrenous leg to have BKA tomorrow.  Dressings left in place.  100% black eschar to heel.  Very foul odor.   Wound bed: Clean and pink to pressure ulcers to trunk.  Drainage (amount, consistency, odor) Minimal, serous to pressure ulcers on trunk.  Periwound: Intact. Dressing procedure/placement/frequency: Allevyn silicone bordered foam to each pressure ulcer on trunk.  Change every 3 days and PRN soilage.   BKA Left leg tomorrow, right leg already has BKA.    Patient has Home Health now and hopes to return home with Home Health when ready.

## 2012-11-23 NOTE — Progress Notes (Signed)
INITIAL NUTRITION ASSESSMENT  DOCUMENTATION CODES Per approved criteria  -Morbid Obesity   INTERVENTION: 1.  General healthful diet; encourage intake as needed. 2.  Supplements; Juven BID mixed with water.  NUTRITION DIAGNOSIS: Increased nutrient needs related to healing as evidenced by multiple ulcers, wounds, gangrene. 1.  Food/Beverage; pt meeting >/=90% estimated needs with tolerance.  Monitor:  1.  Food/Beverage; pt meeting >/=90% estimated needs with tolerance. 2.  Wt/wt change; monitor trends  Reason for Assessment: MST  72 y.o. male  Admitting Dx: Diabetic wet gangrene of the foot  ASSESSMENT: Pt admitted with wet gangrene.   Pt with gangrene to left lower leg.  Pressure ulcers to coccyx, buttocks and upper thigh.   Pt with elevated CBGs. History of poor glucose control. Pt with stable wt PTA.   Pt sleeping at time of visit.  Does not awaken to voice x2.   Height: Ht Readings from Last 1 Encounters:  11/22/12 6' (1.829 m)    Weight: Wt Readings from Last 1 Encounters:  11/22/12 350 lb (158.759 kg)    Ideal Body Weight: 80.9 kg  % Ideal Body Weight: 195%  Wt Readings from Last 10 Encounters:  11/22/12 350 lb (158.759 kg)  06/04/12 311 lb 1.6 oz (141.114 kg)  06/04/12 311 lb 1.6 oz (141.114 kg)  06/04/12 311 lb 1.6 oz (141.114 kg)  01/11/10 360 lb (163.295 kg)  01/05/09 360 lb (163.295 kg)  04/03/07 360 lb (163.295 kg)    Usual Body Weight: 360 lbs  % Usual Body Weight: 97%  BMI:  Body mass index is 47.46 kg/(m^2).  Estimated Nutritional Needs: Kcal: 2000-2250 Protein: 100-120g Fluid: >2.0 L/day  Skin: multiple wounds, pressure ulcers, gangrene  Diet Order: Carb Control  EDUCATION NEEDS: -Education not appropriate at this time   Intake/Output Summary (Last 24 hours) at 11/23/12 1532 Last data filed at 11/23/12 0927  Gross per 24 hour  Intake 1482.5 ml  Output      0 ml  Net 1482.5 ml    Last BM: 9/22   Labs:   Recent  Labs Lab 11/22/12 2026 11/23/12 0210  NA 126* 127*  K 4.1 3.7  CL 90* 92*  CO2 23 27  BUN 48* 49*  CREATININE 2.99* 2.78*  CALCIUM 8.5 8.0*  GLUCOSE 269* 200*    CBG (last 3)   Recent Labs  11/23/12 0453 11/23/12 0847 11/23/12 1138  GLUCAP 159* 216* 245*    Scheduled Meds: . allopurinol  100 mg Oral Daily  . atorvastatin  40 mg Oral q1800  . [START ON 11/24/2012] chlorhexidine  60 mL Topical Once  . diltiazem  180 mg Oral BID  . [START ON 11/24/2012] influenza vac split quadrivalent PF  0.5 mL Intramuscular Tomorrow-1000  . insulin aspart  0-9 Units Subcutaneous Q4H  . insulin glargine  15 Units Subcutaneous BID  . levothyroxine  200 mcg Oral QAC breakfast  . piperacillin-tazobactam (ZOSYN)  IV  3.375 g Intravenous Q8H  . sodium chloride  500 mL Intravenous Once  . sodium chloride  3 mL Intravenous Q12H  . [START ON 11/24/2012] vancomycin  2,000 mg Intravenous Q24H    Continuous Infusions: . sodium chloride 100 mL/hr (11/23/12 0200)    Past Medical History  Diagnosis Date  . Depression   . Hyperlipidemia   . Hypertension   . Chronic kidney disease   . CHF (congestive heart failure)   . Sleep apnea   . Gout   . Foot ulcer, left 06/04/2012  dorsum   . Type II diabetes mellitus   . History of blood transfusion     "counting this am I've had blood probably 3 times" (11/23/2012)  . Osteoarthritis     "legs sometimes" (11/23/2012)    Past Surgical History  Procedure Laterality Date  . Leg amputation below knee Right   . Orchiectomy Left     "I think it's the left" (11/23/2012)  . Shoulder open rotator cuff repair Left   . Inguinal hernia repair  1990's?    "? side" (11/23/2012)  . Cataract extraction w/ intraocular lens  implant, bilateral Bilateral ~2010  . I&d extremity Left 06/05/2012    Procedure: IRRIGATION AND DEBRIDEMENT LT ANKLE ;  Surgeon: Nadara Mustard, MD;  Location: [facility] OR;  Service: Orthopedics;  Laterality: Left;  . Application of wound vac Left  06/05/2012    Procedure: APPLICATION OF WOUND VAC;  Surgeon: Nadara Mustard, MD;  Location: [facility] OR;  Service: Orthopedics;  Laterality: Left;  . Apligraft placement Left 06/10/2012    Procedure: Left ankle skin graft;  Surgeon: Nadara Mustard, MD;  Location: [facility];  Service: Orthopedics;  Laterality: Left;  . Application of wound vac Left 06/10/2012    Procedure: APPLICATION OF WOUND VAC;  Surgeon: Nadara Mustard, MD;  Location: [facility];  Service: Orthopedics;  Laterality: Left;    Loyce Dys, MS RD LDN Clinical Inpatient Dietitian Pager: [phone] Weekend/After hours pager: [phone]

## 2012-11-23 NOTE — Progress Notes (Signed)
Inpatient Diabetes Program Recommendations  AACE/ADA: New Consensus Statement on Inpatient Glycemic Control (2013)  Target Ranges:  Prepandial:   less than 140 mg/dL      Peak postprandial:   less than 180 mg/dL (1-2 hours)      Critically ill patients:  140 - 180 mg/dL   Reason for Assessment:  Hyperglycemia  Results for NYZAIAH, KAI (MRN [id number]) as of 11/23/2012 14:07  Ref. Range 11/22/2012 19:49 11/23/2012 01:15 11/23/2012 04:53 11/23/2012 08:47 11/23/2012 11:38  Glucose-Capillary Latest Range: 70-99 mg/dL 409 (H) 811 (H) 914 (H) 216 (H) 245 (H)    Note:   May benefit from:  Increasing basal insulin to 20 units BID.  (Home dose is 30 units BID)    Add 4 units meal coverage  Increase correction scale to moderate or resistant scale  Thank you.  Donae Kueker S. Elsie Lincoln, RN, CNS, CDE Inpatient Diabetes Program, team pager [phone]  May also benefit from starting meal coverage at 4 units tid

## 2012-11-23 NOTE — Progress Notes (Signed)
Pt. PCR Negative for MRSA, Contact precautions discontinued. Will continue to monitor.   Thane Edu, RN

## 2012-11-23 NOTE — Progress Notes (Signed)
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 Dr Lajoyce Corners planing amputation 9-23. MRI ordered.   2-Acute on chronic anemia: Getting PRBC. He will need colonoscopy at some point prior starting anticoagulation. Repeat Hb in am.   3-Afib:Will decrease Cardizem to 180 mg BID from 240 BID to avoid hypotension. Holder Parameters for SBP less than 100. Holding coumadin. Will give one time dose vitamin K 10 . Repeat INR in am. If INR elevated in Am can give FFP. INR today at 2.2  4-IDDM type 2: Continue with lantus.   5-AKI on CKD 3: continue with IV fluids, treat infection, avoid hypotension. Cr decrease to 2.7 from 2.99. Prior Cr per records at 1.4. 6-Hyponatremia: in setting of acute renal failure and decrease volume. Continue with IV fluids.  7-Early Sepsis: presents with mild hypotension, increase lactic acid. Source of infection R LE wound. IV fluids, IV antibiotics.    Code Status: Full Code.  Family Communication: Care discussed with patient.  Disposition Plan: Will likely need snf   Consultants:  Dr Lajoyce Corners.   Procedures:  none  Antibiotics:  Vancomycin 9-21  Zosyn 9-21  HPI/Subjective: No complaints, need to use the commode.   Objective: Filed Vitals:   11/23/12 0844  BP: 104/54  Pulse: 88  Temp: 98.4 F (36.9 C)  Resp: 17    Intake/Output Summary (Last 24 hours) at 11/23/12 0850 Last data filed at 11/23/12 0600  Gross per 24 hour  Intake    900 ml  Output      0 ml  Net    900 ml   Filed Weights   11/22/12 1934  Weight: 158.759 kg (350 lb)    Exam:   General:  Obese, no acute distress.   Cardiovascular: S 1, S 2 RRR  Respiratory: CTA  Abdomen: Obese, distended. NT  Musculoskeletal: right  BKA, left LE  with ulcer anterior tibia, necrotic area of left  heel.   Data  Reviewed: Basic Metabolic Panel:  Recent Labs Lab 11/22/12 2026 11/23/12 0210  NA 126* 127*  K 4.1 3.7  CL 90* 92*  CO2 23 27  GLUCOSE 269* 200*  BUN 48* 49*  CREATININE 2.99* 2.78*  CALCIUM 8.5 8.0*   Liver Function Tests:  Recent Labs Lab 11/22/12 2026 11/23/12 0210  AST 65* 52*  ALT 18 17  ALKPHOS 36* 32*  BILITOT 0.7 0.7  PROT 6.8 6.6  ALBUMIN 2.4* 2.4*   No results found for this basename: LIPASE, AMYLASE,  in the last 168 hours No results found for this basename: AMMONIA,  in the last 168 hours CBC:  Recent Labs Lab 11/22/12 2026 11/23/12 0210  WBC 9.9 7.6  NEUTROABS 8.5*  --   HGB 7.7* 7.3*  HCT 23.3* 22.2*  MCV 86.9 86.7  PLT 200 211   Cardiac Enzymes:  Recent Labs Lab 11/22/12 2026  TROPONINI <0.30   BNP (last 3 results) No results found for this basename: PROBNP,  in the last 8760 hours CBG:  Recent Labs Lab 11/22/12 1949 11/23/12 0115 11/23/12 0453  GLUCAP 281* 200* 159*    Recent Results (from the past 240 hour(s))  MRSA PCR SCREENING     Status: None   Collection Time    11/23/12  1:35 AM      Result Value Range Status   MRSA by PCR NEGATIVE  NEGATIVE Final  Comment:            The GeneXpert MRSA Assay (FDA     approved for NASAL specimens     only), is one component of a     comprehensive MRSA colonization     surveillance program. It is not     intended to diagnose MRSA     infection nor to guide or     monitor treatment for     MRSA infections.     Studies: Dg Foot Complete Left  11/22/2012   CLINICAL DATA:  Multiple foot wounds.  EXAM: LEFT FOOT - COMPLETE 3+ VIEW  COMPARISON:  Radiographs 06/03/2012. MRI 06/04/2012.  FINDINGS: The bones are diffusely demineralized. There is a large ulcer over the heel with resulting soft tissue emphysema and possible early destruction of the calcaneal tuberosity. No other bone destruction is identified. There are posttraumatic deformities within the metatarsals with a  pseudoarthrosis between the 3rd and 4th metatarsals. No other bone destruction is evident.  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   Electronically Signed   By: Roxy Horseman   On: 11/22/2012 21:59    Scheduled Meds: . allopurinol  100 mg Oral Daily  . atorvastatin  40 mg Oral q1800  . [START ON 11/24/2012] chlorhexidine  60 mL Topical Once  . diltiazem  180 mg Oral BID  . insulin aspart  0-9 Units Subcutaneous Q4H  . insulin glargine  15 Units Subcutaneous BID  . levothyroxine  200 mcg Oral QAC breakfast  . phytonadione  10 mg Oral Once  . piperacillin-tazobactam (ZOSYN)  IV  3.375 g Intravenous Q8H  . sodium chloride  500 mL Intravenous Once  . sodium chloride  3 mL Intravenous Q12H  . vancomycin  1,000 mg Intravenous Q24H   Continuous Infusions: . sodium chloride 100 mL/hr (11/23/12 0200)    Principal Problem:   Diabetic wet gangrene of the foot Active Problems:   HYPERLIPIDEMIA   UNSPECIFIED ANEMIA   HYPERTENSION   Atrial fibrillation   Acute on chronic renal failure    Time spent: 35 minutes.     Deloras Reichard  [facility] Pager [phone]. If 7PM-7AM, please contact night-coverage at [email or web address], password [facility] 11/23/2012, 8:50 AM  LOS: 1 day

## 2012-11-24 ENCOUNTER — Encounter (HOSPITAL_COMMUNITY)
Admission: EM | Disposition: A | Discharge status: Skilled Nursing Facility | Payer: Medicare Other | Source: Home / Self Care | Attending: Internal Medicine

## 2012-11-24 ENCOUNTER — Inpatient Hospital Stay (HOSPITAL_COMMUNITY): Payer: Medicare Other | Admitting: Anesthesiology

## 2012-11-24 ENCOUNTER — Encounter (HOSPITAL_COMMUNITY): Payer: Self-pay | Admitting: Anesthesiology

## 2012-11-24 DIAGNOSIS — D649 Anemia, unspecified: Secondary | ICD-10-CM

## 2012-11-24 HISTORY — PX: AMPUTATION: SHX166

## 2012-11-24 LAB — CBC
HCT: 28.4 % — ABNORMAL LOW (ref 39.0–52.0)
Hemoglobin: 9.3 g/dL — ABNORMAL LOW (ref 13.0–17.0)
Platelets: 207 10*3/uL (ref 150–400)
RDW: 16.8 % — ABNORMAL HIGH (ref 11.5–15.5)
WBC: 7 10*3/uL (ref 4.0–10.5)

## 2012-11-24 LAB — TYPE AND SCREEN
Antibody Screen: NEGATIVE
Unit division: 0

## 2012-11-24 LAB — BASIC METABOLIC PANEL
BUN: 49 mg/dL — ABNORMAL HIGH (ref 6–23)
CO2: 23 mEq/L (ref 19–32)
Chloride: 94 mEq/L — ABNORMAL LOW (ref 96–112)
GFR calc Af Amer: 41 mL/min — ABNORMAL LOW (ref >90)
Potassium: 3.9 mEq/L (ref 3.5–5.1)
Sodium: 130 mEq/L — ABNORMAL LOW (ref 135–145)

## 2012-11-24 LAB — URINE CULTURE: Colony Count: NO GROWTH

## 2012-11-24 LAB — GLUCOSE, CAPILLARY
Glucose-Capillary: 133 mg/dL — ABNORMAL HIGH (ref 70–99)
Glucose-Capillary: 181 mg/dL — ABNORMAL HIGH (ref 70–99)
Glucose-Capillary: 257 mg/dL — ABNORMAL HIGH (ref 70–99)

## 2012-11-24 LAB — PROTIME-INR
INR: 1.47 (ref 0.00–1.49)
Prothrombin Time: 17.4 seconds — ABNORMAL HIGH (ref 11.6–15.2)

## 2012-11-24 SURGERY — AMPUTATION BELOW KNEE
Anesthesia: General | Site: Foot | Laterality: Left | Wound class: Clean

## 2012-11-24 MED ORDER — MORPHINE SULFATE 2 MG/ML IJ SOLN
1.0000 mg | INTRAMUSCULAR | Status: DC | PRN
  Administered 2012-11-24 – 2012-11-25 (×2): 1 mg via INTRAVENOUS
  Administered 2012-11-26 (×2): 0.5 mg via INTRAVENOUS
  Filled 2012-11-24 (×4): qty 1

## 2012-11-24 MED ORDER — HYDROMORPHONE HCL PF 1 MG/ML IJ SOLN
INTRAMUSCULAR | Status: AC
  Administered 2012-11-24: 0.5 mg via INTRAVENOUS
  Filled 2012-11-24: qty 1

## 2012-11-24 MED ORDER — OXYCODONE-ACETAMINOPHEN 5-325 MG PO TABS
1.0000 | ORAL_TABLET | ORAL | Status: DC | PRN
  Administered 2012-11-25 – 2012-11-27 (×10): 2 via ORAL
  Filled 2012-11-24 (×10): qty 2

## 2012-11-24 MED ORDER — ONDANSETRON HCL 4 MG PO TABS: 4.0000 mg | ORAL_TABLET | Freq: Four times a day (QID) | ORAL | Status: DC | PRN

## 2012-11-24 MED ORDER — PROPOFOL 10 MG/ML IV BOLUS
INTRAVENOUS | Status: DC | PRN
  Administered 2012-11-24: 120 mg via INTRAVENOUS
  Administered 2012-11-24: 40 mg via INTRAVENOUS

## 2012-11-24 MED ORDER — ONDANSETRON HCL 4 MG/2ML IJ SOLN
INTRAMUSCULAR | Status: DC | PRN
  Administered 2012-11-24: 4 mg via INTRAVENOUS

## 2012-11-24 MED ORDER — PHENYLEPHRINE HCL 10 MG/ML IJ SOLN
INTRAMUSCULAR | Status: DC | PRN
  Administered 2012-11-24 (×3): 160 ug via INTRAVENOUS
  Administered 2012-11-24: 80 ug via INTRAVENOUS

## 2012-11-24 MED ORDER — WARFARIN - PHARMACIST DOSING INPATIENT
Freq: Every day | Status: DC
  Administered 2012-11-24: 22:00:00

## 2012-11-24 MED ORDER — WARFARIN SODIUM 7.5 MG PO TABS
7.5000 mg | ORAL_TABLET | ORAL | Status: AC
  Administered 2012-11-24: 7.5 mg via ORAL
  Filled 2012-11-24: qty 1

## 2012-11-24 MED ORDER — EPHEDRINE SULFATE 50 MG/ML IJ SOLN
INTRAMUSCULAR | Status: DC | PRN
  Administered 2012-11-24: 5 mg via INTRAVENOUS

## 2012-11-24 MED ORDER — SODIUM CHLORIDE 0.9 % IV SOLN
INTRAVENOUS | Status: DC
  Administered 2012-11-24: 21:00:00 via INTRAVENOUS
  Administered 2012-11-24: 75 mL/h via INTRAVENOUS

## 2012-11-24 MED ORDER — 0.9 % SODIUM CHLORIDE (POUR BTL) OPTIME
TOPICAL | Status: DC | PRN
  Administered 2012-11-24: 1000 mL

## 2012-11-24 MED ORDER — METOCLOPRAMIDE HCL 5 MG PO TABS
5.0000 mg | ORAL_TABLET | Freq: Three times a day (TID) | ORAL | Status: DC | PRN
  Filled 2012-11-24: qty 2

## 2012-11-24 MED ORDER — METOCLOPRAMIDE HCL 5 MG/ML IJ SOLN
5.0000 mg | Freq: Three times a day (TID) | INTRAMUSCULAR | Status: DC | PRN
  Filled 2012-11-24: qty 2

## 2012-11-24 MED ORDER — LACTATED RINGERS IV SOLN
INTRAVENOUS | Status: DC | PRN
  Administered 2012-11-24: 17:00:00 via INTRAVENOUS

## 2012-11-24 MED ORDER — ONDANSETRON HCL 4 MG/2ML IJ SOLN: 4.0000 mg | Freq: Once | INTRAMUSCULAR | Status: DC | PRN

## 2012-11-24 MED ORDER — LIDOCAINE HCL (CARDIAC) 20 MG/ML IV SOLN
INTRAVENOUS | Status: DC | PRN
  Administered 2012-11-24: 100 mg via INTRAVENOUS

## 2012-11-24 MED ORDER — ONDANSETRON HCL 4 MG/2ML IJ SOLN: 4.0000 mg | Freq: Four times a day (QID) | INTRAMUSCULAR | Status: DC | PRN

## 2012-11-24 MED ORDER — HYDROMORPHONE HCL PF 1 MG/ML IJ SOLN
0.2500 mg | INTRAMUSCULAR | Status: DC | PRN
  Administered 2012-11-24 (×3): 0.5 mg via INTRAVENOUS

## 2012-11-24 MED ORDER — FENTANYL CITRATE 0.05 MG/ML IJ SOLN
INTRAMUSCULAR | Status: DC | PRN
  Administered 2012-11-24 (×2): 25 ug via INTRAVENOUS
  Administered 2012-11-24: 100 ug via INTRAVENOUS

## 2012-11-24 SURGICAL SUPPLY — 46 items
BANDAGE ESMARK 6X9 LF (GAUZE/BANDAGES/DRESSINGS) ×1 IMPLANT
BANDAGE GAUZE ELAST BULKY 4 IN (GAUZE/BANDAGES/DRESSINGS) ×3 IMPLANT
BLADE SAW RECIP 87.9 MT (BLADE) ×2 IMPLANT
BLADE SURG 21 STRL SS (BLADE) ×2 IMPLANT
BNDG CMPR 9X6 STRL LF SNTH (GAUZE/BANDAGES/DRESSINGS)
BNDG COHESIVE 4X5 TAN STRL (GAUZE/BANDAGES/DRESSINGS) ×1 IMPLANT
BNDG COHESIVE 6X5 TAN STRL LF (GAUZE/BANDAGES/DRESSINGS) ×4 IMPLANT
BNDG ESMARK 6X9 LF (GAUZE/BANDAGES/DRESSINGS)
CLOTH BEACON ORANGE TIMEOUT ST (SAFETY) ×2 IMPLANT
COVER SURGICAL LIGHT HANDLE (MISCELLANEOUS) ×2 IMPLANT
CUFF TOURNIQUET SINGLE 34IN LL (TOURNIQUET CUFF) ×1 IMPLANT
CUFF TOURNIQUET SINGLE 44IN (TOURNIQUET CUFF) IMPLANT
DRAIN PENROSE 1/2X12 LTX STRL (WOUND CARE) IMPLANT
DRAPE EXTREMITY T 121X128X90 (DRAPE) ×2 IMPLANT
DRAPE PROXIMA HALF (DRAPES) ×4 IMPLANT
DRAPE U-SHAPE 47X51 STRL (DRAPES) ×3 IMPLANT
DRSG ADAPTIC 3X8 NADH LF (GAUZE/BANDAGES/DRESSINGS) ×2 IMPLANT
DRSG PAD ABDOMINAL 8X10 ST (GAUZE/BANDAGES/DRESSINGS) ×2 IMPLANT
DURAPREP 26ML APPLICATOR (WOUND CARE) ×2 IMPLANT
ELECT REM PT RETURN 9FT ADLT (ELECTROSURGICAL) ×2
ELECTRODE REM PT RTRN 9FT ADLT (ELECTROSURGICAL) ×1 IMPLANT
EVACUATOR 1/8 PVC DRAIN (DRAIN) IMPLANT
GLOVE BIOGEL PI IND STRL 9 (GLOVE) ×1 IMPLANT
GLOVE BIOGEL PI INDICATOR 9 (GLOVE) ×1
GLOVE SURG ORTHO 9.0 STRL STRW (GLOVE) ×2 IMPLANT
GOWN PREVENTION PLUS XLARGE (GOWN DISPOSABLE) ×2 IMPLANT
GOWN SRG XL XLNG 56XLVL 4 (GOWN DISPOSABLE) ×1 IMPLANT
GOWN STRL NON-REIN XL XLG LVL4 (GOWN DISPOSABLE) ×2
KIT BASIN OR (CUSTOM PROCEDURE TRAY) ×2 IMPLANT
KIT ROOM TURNOVER OR (KITS) ×2 IMPLANT
MANIFOLD NEPTUNE II (INSTRUMENTS) ×1 IMPLANT
NS IRRIG 1000ML POUR BTL (IV SOLUTION) ×2 IMPLANT
PACK GENERAL/GYN (CUSTOM PROCEDURE TRAY) ×2 IMPLANT
PAD ARMBOARD 7.5X6 YLW CONV (MISCELLANEOUS) ×4 IMPLANT
SPONGE GAUZE 4X4 12PLY (GAUZE/BANDAGES/DRESSINGS) ×2 IMPLANT
SPONGE LAP 18X18 X RAY DECT (DISPOSABLE) ×3 IMPLANT
STAPLER VISISTAT 35W (STAPLE) ×1 IMPLANT
STOCKINETTE IMPERVIOUS LG (DRAPES) ×2 IMPLANT
SUT PDS AB 1 CT  36 (SUTURE)
SUT PDS AB 1 CT 36 (SUTURE) IMPLANT
SUT SILK 2 0 (SUTURE) ×2
SUT SILK 2-0 18XBRD TIE 12 (SUTURE) ×1 IMPLANT
TOWEL OR 17X24 6PK STRL BLUE (TOWEL DISPOSABLE) ×2 IMPLANT
TOWEL OR 17X26 10 PK STRL BLUE (TOWEL DISPOSABLE) ×2 IMPLANT
TUBE ANAEROBIC SPECIMEN COL (MISCELLANEOUS) IMPLANT
WATER STERILE IRR 1000ML POUR (IV SOLUTION) ×2 IMPLANT

## 2012-11-24 NOTE — Consult Note (Signed)
PHARMACY CONSULT NOTE  Pharmacy Consult:  Coumadin Indication: History of atrial fibrillation   Allergies: No Known Allergies  Height/Weight: Height: 6' (182.9 cm) Weight: 350 lb (158.759 kg) IBW/kg (Calculated) : 77.6 Dosing weight 159 kg  Vital Signs: BP 112/55  Pulse 109  Temp(Src) 98.1 F (36.7 C) (Oral)  Resp 26  Ht 6' (1.829 m)  Wt 350 lb (158.759 kg)  BMI 47.46 kg/m2  SpO2 97%  Active Problems: Principal Problem:   Diabetic wet gangrene of the foot Active Problems:   HYPERLIPIDEMIA   UNSPECIFIED ANEMIA   HYPERTENSION   Atrial fibrillation   Acute on chronic renal failure   Labs:  Recent Labs  11/22/12 2026 11/23/12 0008 11/23/12 0210 11/24/12 0500  HGB 7.7*  --  7.3* 9.3*  HCT 23.3*  --  22.2* 28.4*  PLT 200  --  211 207  LABPROT  --  24.0* 23.8* 17.4*  INR  --  2.23* 2.21* 1.47  CREATININE 2.99*  --  2.78* 1.82*   Lab Results  Component Value Date   INR 1.47 11/24/2012   INR 2.21* 11/23/2012   INR 2.23* 11/23/2012   Estimated Creatinine Clearance: 56.3 ml/min (by C-G formula based on Cr of 1.82).  Medical / Surgical History: Past Medical History  Diagnosis Date  . Depression   . Hyperlipidemia   . Hypertension   . Chronic kidney disease   . CHF (congestive heart failure)   . Sleep apnea   . Gout   . Foot ulcer, left 06/04/2012    dorsum   . Type II diabetes mellitus   . History of blood transfusion     "counting this am I've had blood probably 3 times" (11/23/2012)  . Osteoarthritis     "legs sometimes" (11/23/2012)   Past Surgical History  Procedure Laterality Date  . Leg amputation below knee Right   . Orchiectomy Left     "I think it's the left" (11/23/2012)  . Shoulder open rotator cuff repair Left   . Inguinal hernia repair  1990's?    "? side" (11/23/2012)  . Cataract extraction w/ intraocular lens  implant, bilateral Bilateral ~2010  . I&d extremity Left 06/05/2012    Procedure: IRRIGATION AND DEBRIDEMENT LT ANKLE ;  Surgeon:  Nadara Mustard, MD;  Location: [facility] OR;  Service: Orthopedics;  Laterality: Left;  . Application of wound vac Left 06/05/2012    Procedure: APPLICATION OF WOUND VAC;  Surgeon: Nadara Mustard, MD;  Location: [facility] OR;  Service: Orthopedics;  Laterality: Left;  . Apligraft placement Left 06/10/2012    Procedure: Left ankle skin graft;  Surgeon: Nadara Mustard, MD;  Location: [facility] OR;  Service: Orthopedics;  Laterality: Left;  . Application of wound vac Left 06/10/2012    Procedure: APPLICATION OF WOUND VAC;  Surgeon: Nadara Mustard, MD;  Location: [facility] OR;  Service: Orthopedics;  Laterality: Left;    Medications:  Prescriptions prior to admission  Medication Sig Dispense Refill  . allopurinol (ZYLOPRIM) 100 MG tablet Take 100 mg by mouth daily.      Marland Kitchen ascorbic acid (VITAMIN C) 1000 MG tablet Take 1,000 mg by mouth daily.      Marland Kitchen diltiazem (DILACOR XR) 240 MG 24 hr capsule Take 1 capsule (240 mg total) by mouth 2 (two) times daily.  180 capsule  3  . fenofibrate 160 MG tablet Take 1 tablet (160 mg total) by mouth daily.  30 tablet  2  . furosemide (LASIX) 20 MG tablet  Take 20 mg by mouth.      . gabapentin (NEURONTIN) 300 MG capsule Take 1 capsule (300 mg total) by mouth 3 (three) times daily as needed (for nerve pain.).  270 capsule  3  . Hydrocodone-Acetaminophen (VICODIN ES) 7.5-300 MG TABS Take 1 tablet by mouth every 6 (six) hours as needed (for pain).      . insulin glargine (LANTUS) 100 UNIT/ML injection Inject 0.3 mLs (30 Units total) into the skin 2 (two) times daily.  10 mL    . insulin lispro (HUMALOG) 100 UNIT/ML injection Inject 25 Units into the skin 3 (three) times daily before meals.  10 mL  5  . levothyroxine (SYNTHROID, LEVOTHROID) 200 MCG tablet Take 1 tablet (200 mcg total) by mouth daily before breakfast.  90 tablet  3  . Liraglutide (VICTOZA) 18 MG/3ML SOLN injection Inject 1.2 mg into the skin daily.      . Multiple Vitamin (MULTIVITAMIN WITH MINERALS) TABS Take 1 tablet by mouth daily.       Marland Kitchen nystatin (MYCOSTATIN) powder Apply 1 g topically See admin instructions. Apply 2-3 times daily      . potassium chloride (K-DUR,KLOR-CON) 10 MEQ tablet Take 10 mEq by mouth daily.      Marland Kitchen rOPINIRole (REQUIP) 2 MG tablet Take 2 mg by mouth at bedtime.      . rosuvastatin (CRESTOR) 20 MG tablet Take 20 mg by mouth daily.      . silver sulfADIAZINE (SILVADENE) 1 % cream Apply 1 application topically 2 (two) times daily.      . tamsulosin (FLOMAX) 0.4 MG CAPS capsule Take 1 capsule (0.4 mg total) by mouth daily.  30 capsule  2  . warfarin (COUMADIN) 5 MG tablet Take 1 tablet (5 mg total) by mouth daily.  90 tablet  0   Scheduled:  . allopurinol  100 mg Oral Daily  . atorvastatin  40 mg Oral q1800  . diltiazem  180 mg Oral BID  . influenza vac split quadrivalent PF  0.5 mL Intramuscular Tomorrow-1000  . insulin aspart  0-9 Units Subcutaneous Q4H  . insulin glargine  15 Units Subcutaneous BID  . levothyroxine  200 mcg Oral QAC breakfast  . nutrition supplement  1 packet Oral BID BM  . piperacillin-tazobactam (ZOSYN)  IV  3.375 g Intravenous Q8H  . sodium chloride  500 mL Intravenous Once  . sodium chloride  3 mL Intravenous Q12H  . vancomycin  2,000 mg Intravenous Q24H   Infusions:  . sodium chloride 75 mL/hr at 11/24/12 2128   Anti-infectives   Start     Dose/Rate Route Frequency Ordered Stop   11/24/12 1000  vancomycin (VANCOCIN) 2,000 mg in sodium chloride 0.9 % 500 mL IVPB     2,000 mg 250 mL/hr over 120 Minutes Intravenous Every 24 hours 11/23/12 1112     11/23/12 1200  vancomycin (VANCOCIN) IVPB 1000 mg/200 mL premix     1,000 mg 200 mL/hr over 60 Minutes Intravenous  Once 11/23/12 1112 11/23/12 1254   11/23/12 1000  vancomycin (VANCOCIN) IVPB 1000 mg/200 mL premix  Status:  Discontinued     1,000 mg 200 mL/hr over 60 Minutes Intravenous Every 24 hours 11/23/12 0114 11/23/12 1112   11/23/12 0400  piperacillin-tazobactam (ZOSYN) IVPB 3.375 g     3.375 g 12.5 mL/hr over 240  Minutes Intravenous 3 times per day 11/23/12 0114     11/22/12 2100  vancomycin (VANCOCIN) 2,000 mg in sodium chloride 0.9 % 500 mL  IVPB     2,000 mg 250 mL/hr over 120 Minutes Intravenous  Once 11/22/12 2047 11/23/12 0031   11/22/12 2100  piperacillin-tazobactam (ZOSYN) IVPB 3.375 g     3.375 g 100 mL/hr over 30 Minutes Intravenous  Once 11/22/12 2047 11/22/12 2150      Assessment:  72 y.o.male s/p L-BKA who has been on chronic home Coumadin for atrial fibrillation.  His Coumadin was stopped prior to surgery and the patient received Vit K 10 mg x 1 and 2 units of blood.  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  Coumadin to be restarted this PM.    Home dose of Coumadin 5 mg daily.  Last INR 1.47.  Hgb 9.3.   Goal of Therapy:   INR 2-3      Plan:   Coumadin 7.5 mg today. Daily INR's, CBC.  Monitor for bleeding complications.   Lorriane Dehart, Elisha Headland,  Pharm.D.. 11/24/2012,  9:51 PM

## 2012-11-24 NOTE — Progress Notes (Signed)
CSW attempted to call daughter about possible SNF when medically ready for dc. Phone number in Epic was incorrect. CSW will update later when new information arises. Assessment to follow.  Maree Krabbe, MSW, Theresia Majors [phone]

## 2012-11-24 NOTE — Transfer of Care (Signed)
Immediate Anesthesia Transfer of Care Note  Patient: [name]  Procedure(s) Performed: Procedure(s) with comments: AMPUTATION BELOW KNEE (Left) - Left Below Knee Amputation  Patient Location: PACU  Anesthesia Type:General  Level of Consciousness: awake, alert  and oriented  Airway & Oxygen Therapy: Patient Spontanous Breathing and Patient connected to face mask oxygen  Post-op Assessment: Report given to PACU RN  Post vital signs: Reviewed and stable  Complications: No apparent anesthesia complications

## 2012-11-24 NOTE — Anesthesia Procedure Notes (Signed)
Procedure Name: LMA Insertion Date/Time: 11/24/2012 5:20 PM Performed by: Jefm Miles E Pre-anesthesia Checklist: Patient identified, Emergency Drugs available, Suction available, Patient being monitored and Timeout performed Patient Re-evaluated:Patient Re-evaluated prior to inductionOxygen Delivery Method: Circle system utilized Preoxygenation: Pre-oxygenation with 100% oxygen Intubation Type: IV induction LMA: LMA inserted LMA Size: 5.0 Number of attempts: 1 Placement Confirmation: ETT inserted through vocal cords under direct vision,  positive ETCO2 and breath sounds checked- equal and bilateral Tube secured with: Tape Dental Injury: Teeth and Oropharynx as per pre-operative assessment

## 2012-11-24 NOTE — Anesthesia Preprocedure Evaluation (Addendum)
Anesthesia Evaluation  Patient identified by MRN, date of birth, ID band Patient awake    Reviewed: Allergy & Precautions, H&P , NPO status , Patient's Chart, lab work & pertinent test results  Airway Mallampati: I TM Distance: >3 FB Neck ROM: full    Dental  (+) Edentulous Upper, Edentulous Lower and Dental Advisory Given   Pulmonary sleep apnea ,          Cardiovascular hypertension, +CHF + dysrhythmias Atrial Fibrillation Rhythm:irregular Rate:Abnormal     Neuro/Psych PSYCHIATRIC DISORDERS    GI/Hepatic   Endo/Other  diabetes, Type 2, Insulin DependentHypothyroidism   Renal/GU Renal InsufficiencyRenal disease     Musculoskeletal   Abdominal   Peds  Hematology  (+) anemia ,   Anesthesia Other Findings   Reproductive/Obstetrics                          Anesthesia Physical Anesthesia Plan  ASA: IV  Anesthesia Plan: General   Post-op Pain Management:    Induction: Intravenous  Airway Management Planned: Oral ETT  Additional Equipment:   Intra-op Plan:   Post-operative Plan: Extubation in OR  Informed Consent: I have reviewed the patients History and Physical, chart, labs and discussed the procedure including the risks, benefits and alternatives for the proposed anesthesia with the patient or authorized representative who has indicated his/her understanding and acceptance.     Plan Discussed with: CRNA, Anesthesiologist and Surgeon  Anesthesia Plan Comments:         Anesthesia Quick Evaluation

## 2012-11-24 NOTE — Interval H&P Note (Signed)
History and Physical Interval Note:  11/24/2012 6:16 AM  [name]  has presented today for surgery, with the diagnosis of Gangrene Left Foot  The various methods of treatment have been discussed with the patient and family. After consideration of risks, benefits and other options for treatment, the patient has consented to  Procedure(s) with comments: AMPUTATION BELOW KNEE (Left) - Left Below Knee Amputation as a surgical intervention .  The patient's history has been reviewed, patient examined, no change in status, stable for surgery.  I have reviewed the patient's chart and labs.  Questions were answered to the patient's satisfaction.     DUDA,MARCUS V

## 2012-11-24 NOTE — Progress Notes (Signed)
CSW spoke with daughter about possible SNF when medically ready for dc. Daughter states that when she explained short term rehab to patient, patient refused. Daughter expressed to CSW that patient "basically lives alone", patient has a couple living with him who "need care themselves." Patient's daughter stated she is worried for their living conditions if patient refuses SNF. CSW stated she will speak with MD and hopefully be able to get patient on board with SNF plans. CSW will complete assessment with patient once he has his medical procedure done and PT evaluates patient.  Maree Krabbe, MSW, Theresia Majors [phone]

## 2012-11-24 NOTE — Op Note (Signed)
OPERATIVE REPORT  DATE OF SURGERY: 11/24/2012  PATIENT:  [name],  72 y.o. male  PRE-OPERATIVE DIAGNOSIS:  Gangrene Left Foot  POST-OPERATIVE DIAGNOSIS:  Gangrene Left Foot  PROCEDURE:  Procedure(s): AMPUTATION BELOW KNEE  SURGEON:  Surgeon(s): Nadara Mustard, MD  ANESTHESIA:   general  EBL:  min ML  SPECIMEN:  Source of Specimen:  Left leg  TOURNIQUET:   Total Tourniquet Time Documented: Thigh (Left) - 6 minutes Total: Thigh (Left) - 6 minutes   PROCEDURE DETAILS: Patient is a 72 year old gentleman who presents with foul-smelling gangrenous changes to his left foot. Patient has failed prolonged conservative care and presents at this time for transtibial amputation. Risks and benefits were discussed including infection neurovascular injury nonhealing of the wound need for higher level amputation. Patient states he understands and wished to proceed at this time. Description of procedure patient was brought to the operative room and underwent a general anesthetic. After adequate levels of anesthesia were obtained patient's left lower extremity was prepped using DuraPrep and draped into a sterile field and the foot was draped out of the sterile field with an impervious stockinette. A transverse incision was made 11 cm distal to the tibial tubercle this curved proximally and a large posterior flap was created. The tibia was transected just proximal to the skin incision and the fibula was transected just proximal to the tibial incision. The vascular bundles were clamped a large knife was used to create a large posterior flap. The sciatic nerve was pulled cut and allowed to retract. The vascular bundles were suture ligated with 2-0 silk. The tourniquet was deflated hemostasis was obtained. Patient had significantly ischemic changes to the muscle. The deep and superficial fascial layers were then closed using #1 PDS the skin was closed using staples. The wound was covered with Adaptic  orthopedic sponges ABDs dressing Kerlix and Coban. Patient was extubated taken to the PACU in stable condition.  PLAN OF CARE: Admit to inpatient   PATIENT DISPOSITION:  PACU - hemodynamically stable.   Nadara Mustard, MD 11/24/2012 5:58 PM

## 2012-11-24 NOTE — H&P (View-Only) (Signed)
Reason for Consult:gangrene left foot Referring Physician: Dr Regalado  [name] is an 72 y.o. male.  HPI: patient is a 72-year-old gentleman well known to me who is status post a right transtibial amputation. Patient has had a prolonged history of gangrene of the left foot. Patient is failed foot salvage intervention has previously refused amputation ,has not shown for his previous office appointments, for the left lower extremity and presents at this time with progressive gangrenous changes of the left foot.  Past Medical History  Diagnosis Date  . Depression   . Hyperlipidemia   . Hypertension   . Chronic kidney disease   . CHF (congestive heart failure)   . Sleep apnea   . Gout   . Foot ulcer, left 06/04/2012    dorsum   . Type II diabetes mellitus   . Osteoarthritis     "plenty" (06/04/2012)  . Gout     Past Surgical History  Procedure Laterality Date  . Leg amputation below knee Right   . Orchiectomy Left     "I think it's the left" (06/04/2012)  . Shoulder open rotator cuff repair Left   . Inguinal hernia repair  1990's?    "? side" (06/04/2012)  . Cataract extraction w/ intraocular lens  implant, bilateral Bilateral ` 2010  . I&d extremity Left 06/05/2012    Procedure: IRRIGATION AND DEBRIDEMENT LT ANKLE ;  Surgeon: Marcus V Duda, MD;  Location: [facility] OR;  Service: Orthopedics;  Laterality: Left;  . Application of wound vac Left 06/05/2012    Procedure: APPLICATION OF WOUND VAC;  Surgeon: Marcus V Duda, MD;  Location: [facility] OR;  Service: Orthopedics;  Laterality: Left;  . Apligraft placement Left 06/10/2012    Procedure: Left ankle skin graft;  Surgeon: Marcus V Duda, MD;  Location: [facility] OR;  Service: Orthopedics;  Laterality: Left;  . Application of wound vac Left 06/10/2012    Procedure: APPLICATION OF WOUND VAC;  Surgeon: Marcus V Duda, MD;  Location: [facility] OR;  Service: Orthopedics;  Laterality: Left;    Family History  Problem Relation Age of Onset  . Diabetes Brother   . Heart  disease Mother   . Kidney disease Mother     Social History:  reports that he quit smoking about 15 years ago. His smoking use included Cigarettes. He has a 67.5 pack-year smoking history. He has never used smokeless tobacco. He reports that he does not drink alcohol or use illicit drugs.  Allergies: No Known Allergies  Medications: I have reviewed the patient's current medications.  Results for orders placed during the hospital encounter of 11/22/12 (from the past 48 hour(s))  GLUCOSE, CAPILLARY     Status: Abnormal   Collection Time    11/22/12  7:49 PM      Result Value Range   Glucose-Capillary 281 (*) 70 - 99 mg/dL  CBC WITH DIFFERENTIAL     Status: Abnormal   Collection Time    11/22/12  8:26 PM      Result Value Range   WBC 9.9  4.0 - 10.5 K/uL   RBC 2.68 (*) 4.22 - 5.81 MIL/uL   Hemoglobin 7.7 (*) 13.0 - 17.0 g/dL   HCT 23.3 (*) 39.0 - 52.0 %   MCV 86.9  78.0 - 100.0 fL   MCH 28.7  26.0 - 34.0 pg   MCHC 33.0  30.0 - 36.0 g/dL   RDW 16.4 (*) 11.5 - 15.5 %   Platelets 200  150 - 400   K/uL   Neutrophils Relative % 86 (*) 43 - 77 %   Neutro Abs 8.5 (*) 1.7 - 7.7 K/uL   Lymphocytes Relative 7 (*) 12 - 46 %   Lymphs Abs 0.7  0.7 - 4.0 K/uL   Monocytes Relative 7  3 - 12 %   Monocytes Absolute 0.7  0.1 - 1.0 K/uL   Eosinophils Relative 0  0 - 5 %   Eosinophils Absolute 0.0  0.0 - 0.7 K/uL   Basophils Relative 0  0 - 1 %   Basophils Absolute 0.0  0.0 - 0.1 K/uL  COMPREHENSIVE METABOLIC PANEL     Status: Abnormal   Collection Time    11/22/12  8:26 PM      Result Value Range   Sodium 126 (*) 135 - 145 mEq/L   Potassium 4.1  3.5 - 5.1 mEq/L   Chloride 90 (*) 96 - 112 mEq/L   CO2 23  19 - 32 mEq/L   Glucose, Bld 269 (*) 70 - 99 mg/dL   BUN 48 (*) 6 - 23 mg/dL   Creatinine, Ser 2.99 (*) 0.50 - 1.35 mg/dL   Calcium 8.5  8.4 - 10.5 mg/dL   Total Protein 6.8  6.0 - 8.3 g/dL   Albumin 2.4 (*) 3.5 - 5.2 g/dL   AST 65 (*) 0 - 37 U/L   ALT 18  0 - 53 U/L   Alkaline  Phosphatase 36 (*) 39 - 117 U/L   Total Bilirubin 0.7  0.3 - 1.2 mg/dL   GFR calc non Af Amer 19 (*) >90 mL/min   GFR calc Af Amer 22 (*) >90 mL/min   Comment: (NOTE)     The eGFR has been calculated using the CKD EPI equation.     This calculation has not been validated in all clinical situations.     eGFR's persistently <90 mL/min signify possible Chronic Kidney     Disease.  SEDIMENTATION RATE     Status: Abnormal   Collection Time    11/22/12  8:26 PM      Result Value Range   Sed Rate 140 (*) 0 - 16 mm/hr  TROPONIN I     Status: None   Collection Time    11/22/12  8:26 PM      Result Value Range   Troponin I <0.30  <0.30 ng/mL   Comment:            Due to the release kinetics of cTnI,     a negative result within the first hours     of the onset of symptoms does not rule out     myocardial infarction with certainty.     If myocardial infarction is still suspected,     repeat the test at appropriate intervals.  CG4 I-STAT (LACTIC ACID)     Status: Abnormal   Collection Time    11/22/12  8:36 PM      Result Value Range   Lactic Acid, Venous 2.46 (*) 0.5 - 2.2 mmol/L  OCCULT BLOOD, POC DEVICE     Status: Abnormal   Collection Time    11/22/12  9:10 PM      Result Value Range   Fecal Occult Bld POSITIVE (*) NEGATIVE  TYPE AND SCREEN     Status: None   Collection Time    11/22/12 11:38 PM      Result Value Range   ABO/RH(D) O POS     Antibody Screen NEG       Sample Expiration 11/25/2012     Unit Number [id number]     Blood Component Type RED CELLS,LR     Unit division 00     Status of Unit ISSUED     Transfusion Status OK TO TRANSFUSE     Crossmatch Result Compatible     Unit Number [id number]     Blood Component Type RED CELLS,LR     Unit division 00     Status of Unit ISSUED     Transfusion Status OK TO TRANSFUSE     Crossmatch Result Compatible    PREPARE RBC (CROSSMATCH)     Status: None   Collection Time    11/22/12 11:38 PM      Result Value Range    Order Confirmation ORDER PROCESSED BY BLOOD BANK    PROTIME-INR     Status: Abnormal   Collection Time    11/23/12 12:08 AM      Result Value Range   Prothrombin Time 24.0 (*) 11.6 - 15.2 seconds   INR 2.23 (*) 0.00 - 1.49  GLUCOSE, CAPILLARY     Status: Abnormal   Collection Time    11/23/12  1:15 AM      Result Value Range   Glucose-Capillary 200 (*) 70 - 99 mg/dL  MRSA PCR SCREENING     Status: None   Collection Time    11/23/12  1:35 AM      Result Value Range   MRSA by PCR NEGATIVE  NEGATIVE   Comment:            The GeneXpert MRSA Assay (FDA     approved for NASAL specimens     only), is one component of a     comprehensive MRSA colonization     surveillance program. It is not     intended to diagnose MRSA     infection nor to guide or     monitor treatment for     MRSA infections.  COMPREHENSIVE METABOLIC PANEL     Status: Abnormal   Collection Time    11/23/12  2:10 AM      Result Value Range   Sodium 127 (*) 135 - 145 mEq/L   Potassium 3.7  3.5 - 5.1 mEq/L   Chloride 92 (*) 96 - 112 mEq/L   CO2 27  19 - 32 mEq/L   Glucose, Bld 200 (*) 70 - 99 mg/dL   BUN 49 (*) 6 - 23 mg/dL   Creatinine, Ser 2.78 (*) 0.50 - 1.35 mg/dL   Calcium 8.0 (*) 8.4 - 10.5 mg/dL   Total Protein 6.6  6.0 - 8.3 g/dL   Albumin 2.4 (*) 3.5 - 5.2 g/dL   AST 52 (*) 0 - 37 U/L   ALT 17  0 - 53 U/L   Alkaline Phosphatase 32 (*) 39 - 117 U/L   Total Bilirubin 0.7  0.3 - 1.2 mg/dL   GFR calc non Af Amer 21 (*) >90 mL/min   GFR calc Af Amer 24 (*) >90 mL/min   Comment: (NOTE)     The eGFR has been calculated using the CKD EPI equation.     This calculation has not been validated in all clinical situations.     eGFR's persistently <90 mL/min signify possible Chronic Kidney     Disease.  CBC     Status: Abnormal   Collection Time    11/23/12  2:10 AM      Result Value Range   WBC   7.6  4.0 - 10.5 K/uL   RBC 2.56 (*) 4.22 - 5.81 MIL/uL   Hemoglobin 7.3 (*) 13.0 - 17.0 g/dL   HCT 22.2 (*)  39.0 - 52.0 %   MCV 86.7  78.0 - 100.0 fL   MCH 28.5  26.0 - 34.0 pg   MCHC 32.9  30.0 - 36.0 g/dL   RDW 16.4 (*) 11.5 - 15.5 %   Platelets 211  150 - 400 K/uL  PROTIME-INR     Status: Abnormal   Collection Time    11/23/12  2:10 AM      Result Value Range   Prothrombin Time 23.8 (*) 11.6 - 15.2 seconds   INR 2.21 (*) 0.00 - 1.49  LACTIC ACID, PLASMA     Status: None   Collection Time    11/23/12  2:10 AM      Result Value Range   Lactic Acid, Venous 1.6  0.5 - 2.2 mmol/L  GLUCOSE, CAPILLARY     Status: Abnormal   Collection Time    11/23/12  4:53 AM      Result Value Range   Glucose-Capillary 159 (*) 70 - 99 mg/dL   Comment 1 Notify RN      Dg Foot Complete Left  11/22/2012   CLINICAL DATA:  Multiple foot wounds.  EXAM: LEFT FOOT - COMPLETE 3+ VIEW  COMPARISON:  Radiographs 06/03/2012. MRI 06/04/2012.  FINDINGS: The bones are diffusely demineralized. There is a large ulcer over the heel with resulting soft tissue emphysema and possible early destruction of the calcaneal tuberosity. No other bone destruction is identified. There are posttraumatic deformities within the metatarsals with a pseudoarthrosis between the 3rd and 4th metatarsals. No other bone destruction is evident.  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   Electronically Signed   By: Bill  Veazey   On: 11/22/2012 21:59    Review of Systems  All other systems reviewed and are negative.   Blood pressure 105/46, pulse 90, temperature 98.5 F (36.9 C), temperature source Axillary, resp. rate 22, height 6' (1.829 m), weight 158.759 kg (350 lb), SpO2 94.00%. Physical Exam On examination patient has gangrene of the entire heel pad with gangrenous ulcerations of the forefoot as well. There is no cellulitis or infection of the proximal third of the leg. Assessment/Plan: Assessment: Gangrene left foot.  Plan: Patient has started on a  transfusion for his anemia and has started on antibiotics for the infection. We will plan for surgical intervention tomorrow once patient is stabilized. Risks and benefits of surgery were discussed including persistent infection nonhealing of the wound need for higher level amputation. Patient states he understands was to proceed at this time.  Patient will need skilled nursing placement at discharge.  DUDA,MARCUS V 11/23/2012, 6:42 AM      

## 2012-11-24 NOTE — Progress Notes (Signed)
[facility] PROGRESS NOTE  [name]:[id number] DOB: 1941-01-21 DOA: 11/22/2012 PCP: Loreen Freud, DO  Assessment/Plan: 1- left foot Wet gangrene/possible osteomyelitis: Continue with Vancomycin and Zosyn day 3. Dr Lajoyce Corners planing amputation 9-23. Defer to ortho cancel MRI.  -Plan for surgery today. INR decrease to 1.4 after vitamin k.    2-Acute on chronic anemia: received 2 units  PRBC. He will need colonoscopy at some point prior starting anticoagulation. Repeat Hb today at 9.3.   3-Afib: Cardizem decreased (on 9-22)  to 180 mg BID from 240 BID to avoid hypotension. Holder Parameters for SBP less than 100.  Holding coumadin. recieved one time dose vitamin K .   4-IDDM type 2: Continue with lantus.   5-AKI on CKD 3: continue with IV fluids, treat infection, avoid hypotension. Cr decrease to 1.8 from 2.99. Prior Cr per records at 1.4.  6-Hyponatremia: in setting of acute renal failure and decrease volume. Continue with IV fluids. Sodium increase to 130 from 127.   7-Early Sepsis: presents with mild hypotension, increase lactic acid. Source of infection R LE wound. IV fluids, IV antibiotics. Vital stable.    Code Status: Full Code.  Family Communication: Care discussed with patient.  Disposition Plan: Will likely need snf   Consultants:  Dr Lajoyce Corners.   Procedures:  none  Antibiotics:  Vancomycin 9-21  Zosyn 9-21  HPI/Subjective: Denies chest pain, dyspnea. Complaining of left foot pain.   Objective: Filed Vitals:   11/24/12 0424  BP: 135/59  Pulse: 54  Temp: 98.1 F (36.7 C)  Resp: 18    Intake/Output Summary (Last 24 hours) at 11/24/12 0848 Last data filed at 11/24/12 0700  Gross per 24 hour  Intake  342.5 ml  Output      0 ml  Net  342.5 ml   Filed Weights   11/22/12 1934  Weight: 158.759 kg (350 lb)    Exam:   General:  Obese, no acute distress.   Cardiovascular: S 1, S 2 RRR  Respiratory: CTA  Abdomen: Obese, distended.  NT  Musculoskeletal: right  BKA, left LE  with ulcer anterior tibia, necrotic area of left  heel.   Data Reviewed: Basic Metabolic Panel:  Recent Labs Lab 11/22/12 2026 11/23/12 0210 11/24/12 0500  NA 126* 127* 130*  K 4.1 3.7 3.9  CL 90* 92* 94*  CO2 23 27 23   GLUCOSE 269* 200* 190*  BUN 48* 49* 49*  CREATININE 2.99* 2.78* 1.82*  CALCIUM 8.5 8.0* 8.5   Liver Function Tests:  Recent Labs Lab 11/22/12 2026 11/23/12 0210  AST 65* 52*  ALT 18 17  ALKPHOS 36* 32*  BILITOT 0.7 0.7  PROT 6.8 6.6  ALBUMIN 2.4* 2.4*   No results found for this basename: LIPASE, AMYLASE,  in the last 168 hours No results found for this basename: AMMONIA,  in the last 168 hours CBC:  Recent Labs Lab 11/22/12 2026 11/23/12 0210 11/24/12 0500  WBC 9.9 7.6 7.0  NEUTROABS 8.5*  --   --   HGB 7.7* 7.3* 9.3*  HCT 23.3* 22.2* 28.4*  MCV 86.9 86.7 86.3  PLT 200 211 207   Cardiac Enzymes:  Recent Labs Lab 11/22/12 2026  TROPONINI <0.30   BNP (last 3 results) No results found for this basename: PROBNP,  in the last 8760 hours CBG:  Recent Labs Lab 11/23/12 1632 11/23/12 1945 11/24/12 0021 11/24/12 0352 11/24/12 0805  GLUCAP 273* 258* 181* 184* 195*    Recent Results (from the  past 240 hour(s))  MRSA PCR SCREENING     Status: None   Collection Time    11/23/12  1:35 AM      Result Value Range Status   MRSA by PCR NEGATIVE  NEGATIVE Final   Comment:            The GeneXpert MRSA Assay (FDA     approved for NASAL specimens     only), is one component of a     comprehensive MRSA colonization     surveillance program. It is not     intended to diagnose MRSA     infection nor to guide or     monitor treatment for     MRSA infections.  CULTURE, BLOOD (ROUTINE X 2)     Status: None   Collection Time    11/23/12  2:10 AM      Result Value Range Status   Specimen Description BLOOD RIGHT ARM   Final   Special Requests BOTTLES DRAWN AEROBIC ONLY 10CC   Final   Culture   Setup Time     Final   Value: 11/23/2012 10:00     Performed at [facility]   Culture     Final   Value:        BLOOD CULTURE RECEIVED NO GROWTH TO DATE CULTURE WILL BE HELD FOR 5 DAYS BEFORE ISSUING A FINAL NEGATIVE REPORT     Performed at [facility]   Report Status PENDING   Incomplete  CULTURE, BLOOD (ROUTINE X 2)     Status: None   Collection Time    11/23/12  2:15 AM      Result Value Range Status   Specimen Description BLOOD RIGHT HAND   Final   Special Requests BOTTLES DRAWN AEROBIC ONLY 5CC   Final   Culture  Setup Time     Final   Value: 11/23/2012 10:00     Performed at [facility]   Culture     Final   Value:        BLOOD CULTURE RECEIVED NO GROWTH TO DATE CULTURE WILL BE HELD FOR 5 DAYS BEFORE ISSUING A FINAL NEGATIVE REPORT     Performed at [facility]   Report Status PENDING   Incomplete     Studies: Dg Foot Complete Left  11/22/2012   CLINICAL DATA:  Multiple foot wounds.  EXAM: LEFT FOOT - COMPLETE 3+ VIEW  COMPARISON:  Radiographs 06/03/2012. MRI 06/04/2012.  FINDINGS: The bones are diffusely demineralized. There is a large ulcer over the heel with resulting soft tissue emphysema and possible early destruction of the calcaneal tuberosity. No other bone destruction is identified. There are posttraumatic deformities within the metatarsals with a pseudoarthrosis between the 3rd and 4th metatarsals. No other bone destruction is evident.  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   Electronically Signed   By: Roxy Horseman   On: 11/22/2012 21:59    Scheduled Meds: . allopurinol  100 mg Oral Daily  . atorvastatin  40 mg Oral q1800  . diltiazem  180 mg Oral BID  . influenza vac split quadrivalent PF  0.5 mL Intramuscular Tomorrow-1000  . insulin aspart  0-9 Units Subcutaneous Q4H  . insulin glargine  15 Units Subcutaneous BID  . levothyroxine  200 mcg Oral QAC  breakfast  . nutrition supplement  1 packet Oral BID BM  . piperacillin-tazobactam (ZOSYN)  IV  3.375  g Intravenous Q8H  . sodium chloride  500 mL Intravenous Once  . sodium chloride  3 mL Intravenous Q12H  . vancomycin  2,000 mg Intravenous Q24H   Continuous Infusions:    Principal Problem:   Diabetic wet gangrene of the foot Active Problems:   HYPERLIPIDEMIA   UNSPECIFIED ANEMIA   HYPERTENSION   Atrial fibrillation   Acute on chronic renal failure    Time spent: 35 minutes.     Janesia Joswick  [facility] Pager [phone]. If 7PM-7AM, please contact night-coverage at [email or web address], password [facility] 11/24/2012, 8:48 AM  LOS: 2 days

## 2012-11-24 NOTE — Anesthesia Postprocedure Evaluation (Signed)
  Anesthesia Post-op Note  Patient: [name]  Procedure(s) Performed: Procedure(s) with comments: AMPUTATION BELOW KNEE (Left) - Left Below Knee Amputation  Patient Location: PACU  Anesthesia Type:General  Level of Consciousness: awake, alert  and oriented  Airway and Oxygen Therapy: Patient Spontanous Breathing and Patient connected to nasal cannula oxygen  Post-op Pain: mild  Post-op Assessment: Post-op Vital signs reviewed, Patient's Cardiovascular Status Stable, Respiratory Function Stable and Pain level controlled  Post-op Vital Signs: stable  Complications: No apparent anesthesia complications

## 2012-11-25 LAB — CBC
MCH: 28.8 pg (ref 26.0–34.0)
MCHC: 32.1 g/dL (ref 30.0–36.0)
MCV: 90 fL (ref 78.0–100.0)
Platelets: 202 10*3/uL (ref 150–400)
RDW: 17 % — ABNORMAL HIGH (ref 11.5–15.5)
WBC: 5.1 10*3/uL (ref 4.0–10.5)

## 2012-11-25 LAB — BASIC METABOLIC PANEL
BUN: 32 mg/dL — ABNORMAL HIGH (ref 6–23)
CO2: 28 mEq/L (ref 19–32)
Calcium: 8.4 mg/dL (ref 8.4–10.5)
Chloride: 102 mEq/L (ref 96–112)
Creatinine, Ser: 1.37 mg/dL — ABNORMAL HIGH (ref 0.50–1.35)
GFR calc non Af Amer: 50 mL/min — ABNORMAL LOW (ref >90)

## 2012-11-25 LAB — GLUCOSE, CAPILLARY
Glucose-Capillary: 159 mg/dL — ABNORMAL HIGH (ref 70–99)
Glucose-Capillary: 170 mg/dL — ABNORMAL HIGH (ref 70–99)
Glucose-Capillary: 117 mg/dL — ABNORMAL HIGH (ref 70–99)

## 2012-11-25 LAB — PROTIME-INR: INR: 1.28 (ref 0.00–1.49)

## 2012-11-25 MED ORDER — WARFARIN SODIUM 7.5 MG PO TABS
7.5000 mg | ORAL_TABLET | Freq: Once | ORAL | Status: AC
  Administered 2012-11-25: 7.5 mg via ORAL
  Filled 2012-11-25: qty 1

## 2012-11-25 MED ORDER — INFLUENZA VAC SPLIT QUAD 0.5 ML IM SUSP
0.5000 mL | Freq: Once | INTRAMUSCULAR | Status: AC
  Administered 2012-11-25: 0.5 mL via INTRAMUSCULAR
  Filled 2012-11-25: qty 0.5

## 2012-11-25 NOTE — Progress Notes (Signed)
Patient ID: [name], male   DOB: 1940/10/21, 72 y.o.   MRN: [id number] Postoperative day 1 status post left transtibial amputation. Patient will need to start with physical therapy for transfer training. Patient will need discharge to skilled nursing facility.

## 2012-11-25 NOTE — Evaluation (Signed)
Physical Therapy Evaluation Patient Details Name: [name] MRN: [id number] DOB: 1940-07-04 Today's Date: 11/25/2012 Time: 8413-2440 PT Time Calculation (min): 25 min  PT Assessment / Plan / Recommendation History of Present Illness    Left foot gangrene, status post below the knee amputation, procedure performed on 11/24/2012 by orthopedic surgery.   Clinical Impression  Pt presents with good post op mobility given previous BKA on right and was independent at [facility] level at home.  He is currently at supervision level to perform simple bed mobility and will benefit from  nursing to assist with sitting EOB daily or more as able until he becomes more independent with bed>chair transfers. Feel he would tolerate and benefit from inpt rehab setting, screening order placed today.  If cannot qualify for CIR then SNF would be appropriate.  Will initiate PT for transfer training acutely until d/c location is determined.  Thank you.    PT Assessment  Patient needs continued PT services    Follow Up Recommendations  CIR (if not a CIR candidate, agree with SNF search)    Does the patient have the potential to tolerate intense rehabilitation    Yes  Barriers to Discharge Decreased caregiver support but pt has handicap modifications at home      Equipment Recommendations  None recommended by PT    Recommendations for Other Services Rehab consult   Frequency Min 3X/week    Precautions / Restrictions Precautions Precautions: Fall Precaution Comments: bilateral amputee but good sitting balance.  Uses prothesis right leg but it's not here   Pertinent Vitals/Pain 'sore' left residual limb      Mobility  Bed Mobility Bed Mobility: Supine to Sit;Sitting - Scoot to Edge of Bed;Sit to Supine Supine to Sit: 5: Supervision;HOB elevated;With rails Sitting - Scoot to Edge of Bed: 5: Supervision Sit to Supine: 5: Supervision;HOB flat Details for Bed Mobility Assistance: Pt has previous R BKA and  has excellent mobility and balance for changing positions in bed despite obesity and painful residual limb on left.  Sat EOB x 10 minutes Transfers Transfers: Not assessed Ambulation/Gait Ambulation/Gait Assistance: Not tested (comment) Stairs: No Wheelchair Mobility Wheelchair Mobility: No    Exercises     PT Diagnosis: Difficulty walking  PT Problem List: Pain;Obesity;Cardiopulmonary status limiting activity;Decreased mobility;Decreased range of motion;Decreased strength PT Treatment Interventions: Wheelchair mobility training;Patient/family education;Therapeutic exercise;Therapeutic activities;Functional mobility training     PT Goals(Current goals can be found in the care plan section) Acute Rehab PT Goals Patient Stated Goal: back to independent at home PT Goal Formulation: With patient Time For Goal Achievement: 12/09/12 Potential to Achieve Goals: Good  Visit Information  Last PT Received On: 11/25/12 Assistance Needed: +2 (for transfers but moves well so could be +1)       Prior Functioning  Home Living Family/patient expects to be discharged to:: Skilled nursing facility Prior Function Level of Independence: Independent with assistive device(s) Comments: scooting transfers with/without slide board and prosthesis, power w/c for locomotion, performed all ADLs Communication Communication: No difficulties (mildly garbled speech)    Cognition  Cognition Arousal/Alertness: Awake/alert Behavior During Therapy: WFL for tasks assessed/performed Overall Cognitive Status: Within Functional Limits for tasks assessed    Extremity/Trunk Assessment Upper Extremity Assessment Upper Extremity Assessment: Overall WFL for tasks assessed Lower Extremity Assessment Lower Extremity Assessment: RLE deficits/detail;LLE deficits/detail RLE Deficits / Details: previous BKA, uses prothesis LLE Deficits / Details: new BKA with poor knee extension, too painful for PROM and 3-/5 at best  Balance Balance Balance Assessed: Yes Static Sitting Balance Static Sitting - Balance Support: No upper extremity supported Static Sitting - Level of Assistance: 5: Stand by assistance Static Sitting - Comment/# of Minutes: 10 Dynamic Sitting Balance Dynamic Sitting - Balance Support: Left upper extremity supported;Right upper extremity supported Dynamic Sitting - Level of Assistance: 5: Stand by assistance Dynamic Sitting Balance - Compensations: large body habitus provides wide base of support  End of Session PT - End of Session Activity Tolerance: Patient tolerated treatment well Patient left: in bed;with call bell/phone within reach Nurse Communication: Mobility status (Pt should sit EOB with nursing daily or more)  GP     Dennis Bast 11/25/2012, 3:54 PM

## 2012-11-25 NOTE — Progress Notes (Signed)
Clinical Social Work Department CLINICAL SOCIAL WORK PLACEMENT NOTE 11/25/2012  Patient:  [name], [name]  Account Number:  [id number] Admit date:  11/22/2012  Clinical Social Worker:  Carren Rang  Date/time:  11/25/2012 03:07 PM  Clinical Social Work is seeking post-discharge placement for this patient at the following level of care:   SKILLED NURSING   (*CSW will update this form in Epic as items are completed)   11/25/2012  Patient/family provided with [facility] Department of Clinical Social Work's list of facilities offering this level of care within the geographic area requested by the patient (or if unable, by the patient's family).  11/25/2012  Patient/family informed of their freedom to choose among providers that offer the needed level of care, that participate in Medicare, Medicaid or managed care program needed by the patient, have an available bed and are willing to accept the patient.  11/25/2012  Patient/family informed of [facility]' ownership interest in [facility], as well as of the fact that they are under no obligation to receive care at this facility.  PASARR submitted to EDS on 11/24/2012 PASARR number received from EDS on 11/24/2012  FL2 transmitted to all facilities in geographic area requested by pt/family on  11/24/2012 FL2 transmitted to all facilities within larger geographic area on   Patient informed that his/her managed care company has contracts with or will negotiate with  certain facilities, including the following:     Patient/family informed of bed offers received:   Patient chooses bed at  Physician recommends and patient chooses bed at    Patient to be transferred to  on   Patient to be transferred to facility by   The following physician request were entered in Epic:   Additional Comments:  Maree Krabbe, MSW, [employer] [phone]

## 2012-11-25 NOTE — Progress Notes (Signed)
ANTICOAGULATION CONSULT NOTE - Follow Up Consult  Pharmacy Consult for Coumadin Indication: atrial fibrillation  No Known Allergies  Patient Measurements: Height: 6' (182.9 cm) Weight: 350 lb (158.759 kg) IBW/kg (Calculated) : 77.6  Vital Signs: Temp: 97.6 F (36.4 C) (09/24 0408) Temp src: Oral (09/24 0408) BP: 98/67 mmHg (09/24 0700) Pulse Rate: 103 (09/24 0700)  Labs:  Recent Labs  11/22/12 2026  11/23/12 0210 11/24/12 0500 11/25/12 0615  HGB 7.7*  --  7.3* 9.3* 9.2*  HCT 23.3*  --  22.2* 28.4* 28.7*  PLT 200  --  211 207 202  LABPROT  --   < > 23.8* 17.4* 15.7*  INR  --   < > 2.21* 1.47 1.28  CREATININE 2.99*  --  2.78* 1.82* 1.37*  TROPONINI <0.30  --   --   --   --   < > = values in this interval not displayed.  Estimated Creatinine Clearance: 74.8 ml/min (by C-G formula based on Cr of 1.37).   Medications:  Scheduled:  . allopurinol  100 mg Oral Daily  . atorvastatin  40 mg Oral q1800  . diltiazem  180 mg Oral BID  . influenza vac split quadrivalent PF  0.5 mL Intramuscular Once  . insulin aspart  0-9 Units Subcutaneous Q4H  . insulin glargine  15 Units Subcutaneous BID  . levothyroxine  200 mcg Oral QAC breakfast  . nutrition supplement  1 packet Oral BID BM  . piperacillin-tazobactam (ZOSYN)  IV  3.375 g Intravenous Q8H  . sodium chloride  500 mL Intravenous Once  . sodium chloride  3 mL Intravenous Q12H  . vancomycin  2,000 mg Intravenous Q24H  . Warfarin - Pharmacist Dosing Inpatient   Does not apply q1800    Assessment: 72 yo M on Coumadin PTA for hx afib.  Coumadin was reversed with Vit K 10mg  PO prior to L BKA surgery.  INR subtherapeutic as expected.  Pt may require higher doses to overcome Vit K given.  INR 1.28  Goal of Therapy:  INR 2-3   Plan:  Coumadin 7.5 mg PO x 1 tonight. Continue daily INR.  [employer], Pharm.D., BCPS Clinical Pharmacist Pager [phone] 11/25/2012 10:34 AM

## 2012-11-25 NOTE — Progress Notes (Signed)
[facility] PROGRESS NOTE  [name] EAV:[id number] DOB: [birth year] DOA: 11/22/2012 PCP: Loreen Freud, DO  Assessment/Plan: 1. Left foot gangrene, status post below the knee amputation, procedure performed on 11/24/2012 by orthopedic surgery. I believe patient would benefit from physical therapy at skilled nursing facility. We had this discussion and he is agreeable with transfer to SNF. Patient is presently on IV vancomycin and Zosyn. I discussed case with Dr. Ninetta Lights of infectious disease who recommended a total of 2 weeks of IV vancomycin and Zosyn. Given the need for IV antibiotics, will place a PICC line. 2. Atrial fibrillation. Continue Cardizem 180 mg by mouth twice a day. He was restarted on Coumadin. Ventricular rates have been in the 80s to low 100s. 3. Acute on chronic anemia. Patient receiving 2 units of packed red blood cells, with repeat hemoglobin at 9.3. Will send stool for guaiac. He has a history of chronic anemia. No evidence of acute GI bleed at the time of this dictation. 4. Acute on chronic renal failure. Patient's creatinine coming down from 2.78 on 11/22/1998 1412.37 on this mornings lab work. Could be secondary to volume depletion. 5. Insulin-dependent diabetes mellitus. Continue Lantus 15 units subcutaneous twice a day. Continue Accu-Cheks q. a.c. and each bedtime with sliding scale coverage 6. Hyponatremia. Likely secondary to hypovolemia. Sodium improved to 137 from 130 yesterday 7. Hypothyroidism. Continue Synthroid 200 mcg by mouth daily 8. Dyslipidemia. Continue statin therapy   Code Status: Full code Family Communication: Plan discussed with patient Disposition Plan: Plan to transition patient to skilled nursing facility for rehabilitation. He is agreeable to this plan. Dr. Ninetta Lights recommending 2 weeks of IV antibiotics with Vancomycin and Zosyn.    Consultants:  Orthopedic surgery  Procedures:  Low the knee amputation 2 left lower  extremity  Antibiotics:  Vancomycin  Zosyn  HPI/Subjective: Patient with history of gangrene of left foot, failed foot salvage intervention, presented refusing amputation, presented with progressive gangrenous changes of his left foot. He was taken to the OR on 11/24/2012 where he underwent below the knee amputation. Tolerated procedure well there are no immediate complications. This morning he reports having some pain at the amputation site otherwise is tolerating by mouth intake. Had a bowel movement this a.m. Patient has no other complaints.  Objective: Filed Vitals:   11/25/12 0700  BP: 98/67  Pulse: 103  Temp:   Resp: 19    Intake/Output Summary (Last 24 hours) at 11/25/12 1244 Last data filed at 11/25/12 7829  Gross per 24 hour  Intake    400 ml  Output   2225 ml  Net  -1825 ml   Filed Weights   11/22/12 1934  Weight: 158.759 kg (350 lb)    Exam:   General:  Patient is in no acute distress he is awake alert oriented  Cardiovascular: Regular rate rhythm normal S1-S2  Respiratory: Lungs are clear to auscultation bilaterally no wheezing rhonchi or rales  Abdomen: Obese soft nontender nondistended positive bowel  Extremities: Status post below the knee amputation to his left lower extremity, 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   Data Reviewed: Basic Metabolic Panel:  Recent Labs Lab 11/22/12 2026 11/23/12 0210 11/24/12 0500 11/25/12 0615  NA 126* 127* 130* 137  K 4.1 3.7 3.9 3.7  CL 90* 92* 94* 102  CO2 23 27 23 28   GLUCOSE 269* 200* 190* 158*  BUN 48* 49* 49* 32*  CREATININE 2.99* 2.78* 1.82* 1.37*  CALCIUM 8.5 8.0* 8.5 8.4  Liver Function Tests:  Recent Labs Lab 11/22/12 2026 11/23/12 0210  AST 65* 52*  ALT 18 17  ALKPHOS 36* 32*  BILITOT 0.7 0.7  PROT 6.8 6.6  ALBUMIN 2.4* 2.4*   No results found for this basename: LIPASE, AMYLASE,  in the last 168 hours No results found for this basename: AMMONIA,  in  the last 168 hours CBC:  Recent Labs Lab 11/22/12 2026 11/23/12 0210 11/24/12 0500 11/25/12 0615  WBC 9.9 7.6 7.0 5.1  NEUTROABS 8.5*  --   --   --   HGB 7.7* 7.3* 9.3* 9.2*  HCT 23.3* 22.2* 28.4* 28.7*  MCV 86.9 86.7 86.3 90.0  PLT 200 211 207 202   Cardiac Enzymes:  Recent Labs Lab 11/22/12 2026  TROPONINI <0.30   BNP (last 3 results) No results found for this basename: PROBNP,  in the last 8760 hours CBG:  Recent Labs Lab 11/24/12 1803 11/24/12 2352 11/25/12 0407 11/25/12 0802 11/25/12 1113  GLUCAP 142* 257* 159* 223* 203*    Recent Results (from the past 240 hour(s))  MRSA PCR SCREENING     Status: None   Collection Time    11/23/12  1:35 AM      Result Value Range Status   MRSA by PCR NEGATIVE  NEGATIVE Final   Comment:            The GeneXpert MRSA Assay (FDA     approved for NASAL specimens     only), is one component of a     comprehensive MRSA colonization     surveillance program. It is not     intended to diagnose MRSA     infection nor to guide or     monitor treatment for     MRSA infections.  CULTURE, BLOOD (ROUTINE X 2)     Status: None   Collection Time    11/23/12  2:10 AM      Result Value Range Status   Specimen Description BLOOD RIGHT ARM   Final   Special Requests BOTTLES DRAWN AEROBIC ONLY 10CC   Final   Culture  Setup Time     Final   Value: 11/23/2012 10:00     Performed at [facility]   Culture     Final   Value:        BLOOD CULTURE RECEIVED NO GROWTH TO DATE CULTURE WILL BE HELD FOR 5 DAYS BEFORE ISSUING A FINAL NEGATIVE REPORT     Performed at [facility]   Report Status PENDING   Incomplete  CULTURE, BLOOD (ROUTINE X 2)     Status: None   Collection Time    11/23/12  2:15 AM      Result Value Range Status   Specimen Description BLOOD RIGHT HAND   Final   Special Requests BOTTLES DRAWN AEROBIC ONLY 5CC   Final   Culture  Setup Time     Final   Value: 11/23/2012 10:00     Performed at [facility]   Culture     Final   Value:        BLOOD CULTURE RECEIVED NO GROWTH TO DATE CULTURE WILL BE HELD FOR 5 DAYS BEFORE ISSUING A FINAL NEGATIVE REPORT     Performed at [facility]   Report Status PENDING   Incomplete  URINE CULTURE     Status: None   Collection Time    11/23/12 12:06 PM      Result  Value Range Status   Specimen Description URINE, CLEAN CATCH   Final   Special Requests NONE   Final   Culture  Setup Time     Final   Value: 11/23/2012 13:30     Performed at [facility] Count     Final   Value: NO GROWTH     Performed at [facility]   Culture     Final   Value: NO GROWTH     Performed at [facility]   Report Status 11/24/2012 FINAL   Final     Studies: No results found.  Scheduled Meds: . allopurinol  100 mg Oral Daily  . atorvastatin  40 mg Oral q1800  . diltiazem  180 mg Oral BID  . insulin aspart  0-9 Units Subcutaneous Q4H  . insulin glargine  15 Units Subcutaneous BID  . levothyroxine  200 mcg Oral QAC breakfast  . nutrition supplement  1 packet Oral BID BM  . piperacillin-tazobactam (ZOSYN)  IV  3.375 g Intravenous Q8H  . sodium chloride  500 mL Intravenous Once  . sodium chloride  3 mL Intravenous Q12H  . vancomycin  2,000 mg Intravenous Q24H  . warfarin  7.5 mg Oral ONCE-1800  . Warfarin - Pharmacist Dosing Inpatient   Does not apply q1800   Continuous Infusions: . sodium chloride 75 mL/hr at 11/24/12 2128    Principal Problem:   Diabetic wet gangrene of the foot Active Problems:   HYPERLIPIDEMIA   UNSPECIFIED ANEMIA   HYPERTENSION   Atrial fibrillation   Acute on chronic renal failure    Time spent: 35 minutes    Jeralyn Bennett  [facility] Pager [phone]. If 7PM-7AM, please contact night-coverage at [email or web address], password [email or web address] 11/25/2012, 12:44 PM  LOS: 3 days

## 2012-11-25 NOTE — Progress Notes (Signed)
Clinical Social Work Department BRIEF PSYCHOSOCIAL ASSESSMENT 11/25/2012  Patient:  [name], [name]     Account Number:  [id number]     Admit date:  11/22/2012  Clinical Social Worker:  Carren Rang  Date/Time:  11/25/2012 03:01 PM  Referred by:  Physician  Date Referred:  11/25/2012 Referred for  SNF Placement   Other Referral:   Interview type:  Patient Other interview type:    PSYCHOSOCIAL DATA Living Status:  ALONE Admitted from facility:   Level of care:   Primary support name:  Ardell Isaacs Primary support relationship to patient:  CHILD, ADULT Degree of support available:   Daughter is POA, has only had relationship with father for about 1.5 years    CURRENT CONCERNS Current Concerns  Post-Acute Placement   Other Concerns:   Living Conditions at Home    SOCIAL WORK ASSESSMENT / PLAN Clinical Social Worker received referral for SNF placement at d/c. CSW introduced self and explained reason for visit. CSW explained SNF process to patient.  Patient reported he is agreeable for SNF placement for short term SNF. CSW will complete FL2 for MD's signature and will update patient and family when bed offers are received.   Assessment/plan status:  Psychosocial Support/Ongoing Assessment of Needs Other assessment/ plan:   Information/referral to community resources:   SNF information    PATIENT'S/FAMILY'S RESPONSE TO PLAN OF CARE: Patient is agreeable to be faxed out to SNF-       Maree Krabbe, MSW, [employer] [phone]

## 2012-11-25 NOTE — Progress Notes (Signed)
Rehab Admissions Coordinator Note:  Patient was screened by [name] for appropriateness for an Inpatient Acute Rehab Consult.  At this time, we are recommending Inpatient Rehab consult as well as OT eval.  [name] 11/25/2012, 4:08 PM  I can be reached at [phone].

## 2012-11-25 NOTE — Progress Notes (Signed)
Discussed risks, benefits, and alternatives of PICC placement with patient. Patient asked to wait until AM to place.

## 2012-11-26 ENCOUNTER — Encounter (HOSPITAL_COMMUNITY): Payer: Self-pay | Admitting: Orthopedic Surgery

## 2012-11-26 DIAGNOSIS — N058 Unspecified nephritic syndrome with other morphologic changes: Secondary | ICD-10-CM

## 2012-11-26 DIAGNOSIS — E1129 Type 2 diabetes mellitus with other diabetic kidney complication: Secondary | ICD-10-CM

## 2012-11-26 LAB — PROTIME-INR: Prothrombin Time: 20 seconds — ABNORMAL HIGH (ref 11.6–15.2)

## 2012-11-26 LAB — GLUCOSE, CAPILLARY
Glucose-Capillary: 115 mg/dL — ABNORMAL HIGH (ref 70–99)
Glucose-Capillary: 149 mg/dL — ABNORMAL HIGH (ref 70–99)
Glucose-Capillary: 157 mg/dL — ABNORMAL HIGH (ref 70–99)
Glucose-Capillary: 68 mg/dL — ABNORMAL LOW (ref 70–99)
Glucose-Capillary: 90 mg/dL (ref 70–99)

## 2012-11-26 LAB — VANCOMYCIN, TROUGH: Vancomycin Tr: 22.4 ug/mL — ABNORMAL HIGH (ref 10.0–20.0)

## 2012-11-26 MED ORDER — VANCOMYCIN HCL 10 G IV SOLR
1500.0000 mg | INTRAVENOUS | Status: DC
  Administered 2012-11-26: 1500 mg via INTRAVENOUS
  Filled 2012-11-26 (×2): qty 1500

## 2012-11-26 MED ORDER — WARFARIN SODIUM 5 MG PO TABS
5.0000 mg | ORAL_TABLET | Freq: Every day | ORAL | Status: DC
  Administered 2012-11-26: 5 mg via ORAL
  Filled 2012-11-26 (×2): qty 1

## 2012-11-26 MED ORDER — WARFARIN - PHARMACIST DOSING INPATIENT: Freq: Every day | Status: DC

## 2012-11-26 MED ORDER — INSULIN ASPART 100 UNIT/ML ~~LOC~~ SOLN: 0.0000 [IU] | Freq: Three times a day (TID) | SUBCUTANEOUS | Status: DC

## 2012-11-26 NOTE — Progress Notes (Addendum)
CSW faxed over clinicals to [facility] for SNF auth and followed up with representative Molly Maduro from [facility]. CSW awaiting returned call and approval from insurance. Will update when new information arises.  Maree Krabbe, MSW, [facility]ors [phone]

## 2012-11-26 NOTE — Progress Notes (Signed)
PT Cancellation Note  Patient Details Name: [name] MRN: [id number] DOB: May 31, 1940   Cancelled Treatment:    Reason Eval/Treat Not Completed: Fatigue/lethargy limiting ability to participate. Pt reports he didn't sleep last night and not up for mobilizing at this time.   Toney Sang [name] 11/26/2012, 9:31 AM Delaney Meigs, PT [phone]

## 2012-11-26 NOTE — Progress Notes (Signed)
[facility] PROGRESS NOTE  [name] WRU:[id number] DOB: 17-Nov-1940 DOA: 11/22/2012 PCP: Loreen Freud, DO  Assessment/Plan: 1. Left foot gangrene, status post below the knee amputation, procedure performed on 11/24/2012 by orthopedic surgery. I believe patient would benefit from physical therapy at skilled nursing facility. We had this discussion yesteerday and he is agreeable to SNF. Patient is presently on IV vancomycin and Zosyn. I discussed case with Dr. Ninetta Lights of infectious disease yesterday who recommended a total of 2 weeks of IV vancomycin and Zosyn. Given the need for IV antibiotics, awaiting PICC line placement. 2. Atrial fibrillation. Continue Cardizem 180 mg by mouth twice a day, he is rate controlled. He was restarted on Coumadin. 3. Acute on chronic anemia. Patient receiving 2 units of packed red blood cells, repeat Hgt stable. 4. Acute on chronic renal failure. Patient's creatinine coming down from 2.78 on 11/21/2012 to 1.37 on 11/25/12. I suspect this could be secondary to volume depletion. 5. Insulin-dependent diabetes mellitus. Continue Lantus 15 units subcutaneous twice a day. Continue Accu-Cheks q. a.c. and each bedtime with sliding scale coverage 6. Hyponatremia. Likely secondary to hypovolemia. Sodium improved to 137 from 130 yesterday 7. Hypothyroidism. Continue Synthroid 200 mcg by mouth daily 8. Dyslipidemia. Continue statin therapy   Code Status: Full code Family Communication: Plan discussed with patient Disposition Plan: Plan to transition patient to skilled nursing facility for rehabilitation. He is agreeable to this plan. Dr. Ninetta Lights recommending 2 weeks of IV antibiotics with Vancomycin and Zosyn.    Consultants:  Orthopedic surgery  Procedures:  Low the knee amputation 2 left lower extremity  Antibiotics:  Vancomycin  Zosyn  HPI/Subjective: Patient with history of gangrene of left foot, failed foot salvage intervention, presented  refusing amputation, presented with progressive gangrenous changes of his left foot. He was taken to the OR on 11/24/2012 where he underwent below the knee amputation. I discussed case with Dr. Ninetta Lights of infectious disease who recommended a total of 2 weeks of IV antibiotic therapy. I ordered a PICC line to be placed yesterday however patient refused this procedure overnight requesting for it to be done this morning.   Objective: Filed Vitals:   11/26/12 0403  BP: 102/66  Pulse: 85  Temp: 98 F (36.7 C)  Resp: 18    Intake/Output Summary (Last 24 hours) at 11/26/12 1133 Last data filed at 11/26/12 9147  Gross per 24 hour  Intake    240 ml  Output    320 ml  Net    -80 ml   Filed Weights   11/22/12 1934  Weight: 158.759 kg (350 lb)    Exam:   General:  Patient is in no acute distress he is awake alert oriented  Cardiovascular: Regular rate rhythm normal S1-S2  Respiratory: Lungs are clear to auscultation bilaterally no wheezing rhonchi or rales  Abdomen: Obese soft nontender nondistended positive bowel  Extremities: Status post below the knee amputation to his left lower extremity, 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   Data Reviewed: Basic Metabolic Panel:  Recent Labs Lab 11/22/12 2026 11/23/12 0210 11/24/12 0500 11/25/12 0615  NA 126* 127* 130* 137  K 4.1 3.7 3.9 3.7  CL 90* 92* 94* 102  CO2 23 27 23 28   GLUCOSE 269* 200* 190* 158*  BUN 48* 49* 49* 32*  CREATININE 2.99* 2.78* 1.82* 1.37*  CALCIUM 8.5 8.0* 8.5 8.4   Liver Function Tests:  Recent Labs Lab 11/22/12 2026 11/23/12 0210  AST 65* 52*  ALT 18 17  ALKPHOS 36* 32*  BILITOT 0.7 0.7  PROT 6.8 6.6  ALBUMIN 2.4* 2.4*   No results found for this basename: LIPASE, AMYLASE,  in the last 168 hours No results found for this basename: AMMONIA,  in the last 168 hours CBC:  Recent Labs Lab 11/22/12 2026 11/23/12 0210 11/24/12 0500 11/25/12 0615  WBC 9.9 7.6 7.0  5.1  NEUTROABS 8.5*  --   --   --   HGB 7.7* 7.3* 9.3* 9.2*  HCT 23.3* 22.2* 28.4* 28.7*  MCV 86.9 86.7 86.3 90.0  PLT 200 211 207 202   Cardiac Enzymes:  Recent Labs Lab 11/22/12 2026  TROPONINI <0.30   BNP (last 3 results) No results found for this basename: PROBNP,  in the last 8760 hours CBG:  Recent Labs Lab 11/25/12 2037 11/26/12 0003 11/26/12 0401 11/26/12 0802 11/26/12 1059  GLUCAP 117* 90 68* 110* 115*    Recent Results (from the past 240 hour(s))  MRSA PCR SCREENING     Status: None   Collection Time    11/23/12  1:35 AM      Result Value Range Status   MRSA by PCR NEGATIVE  NEGATIVE Final   Comment:            The GeneXpert MRSA Assay (FDA     approved for NASAL specimens     only), is one component of a     comprehensive MRSA colonization     surveillance program. It is not     intended to diagnose MRSA     infection nor to guide or     monitor treatment for     MRSA infections.  CULTURE, BLOOD (ROUTINE X 2)     Status: None   Collection Time    11/23/12  2:10 AM      Result Value Range Status   Specimen Description BLOOD RIGHT ARM   Final   Special Requests BOTTLES DRAWN AEROBIC ONLY 10CC   Final   Culture  Setup Time     Final   Value: 11/23/2012 10:00     Performed at [facility]   Culture     Final   Value:        BLOOD CULTURE RECEIVED NO GROWTH TO DATE CULTURE WILL BE HELD FOR 5 DAYS BEFORE ISSUING A FINAL NEGATIVE REPORT     Performed at [facility]   Report Status PENDING   Incomplete  CULTURE, BLOOD (ROUTINE X 2)     Status: None   Collection Time    11/23/12  2:15 AM      Result Value Range Status   Specimen Description BLOOD RIGHT HAND   Final   Special Requests BOTTLES DRAWN AEROBIC ONLY 5CC   Final   Culture  Setup Time     Final   Value: 11/23/2012 10:00     Performed at [facility]   Culture     Final   Value:        BLOOD CULTURE RECEIVED NO GROWTH TO DATE CULTURE WILL BE HELD FOR 5 DAYS BEFORE  ISSUING A FINAL NEGATIVE REPORT     Performed at [facility]   Report Status PENDING   Incomplete  URINE CULTURE     Status: None   Collection Time    11/23/12 12:06 PM      Result Value Range Status   Specimen Description URINE, CLEAN CATCH   Final   Special  Requests NONE   Final   Culture  Setup Time     Final   Value: 11/23/2012 13:30     Performed at [facility] Count     Final   Value: NO GROWTH     Performed at [facility]   Culture     Final   Value: NO GROWTH     Performed at [facility]   Report Status 11/24/2012 FINAL   Final     Studies: No results found.  Scheduled Meds: . allopurinol  100 mg Oral Daily  . atorvastatin  40 mg Oral q1800  . diltiazem  180 mg Oral BID  . insulin aspart  0-9 Units Subcutaneous Q4H  . insulin glargine  15 Units Subcutaneous BID  . levothyroxine  200 mcg Oral QAC breakfast  . nutrition supplement  1 packet Oral BID BM  . piperacillin-tazobactam (ZOSYN)  IV  3.375 g Intravenous Q8H  . sodium chloride  500 mL Intravenous Once  . sodium chloride  3 mL Intravenous Q12H  . vancomycin  2,000 mg Intravenous Q24H   Continuous Infusions:    Principal Problem:   Diabetic wet gangrene of the foot Active Problems:   HYPERLIPIDEMIA   UNSPECIFIED ANEMIA   HYPERTENSION   Atrial fibrillation   Acute on chronic renal failure    Time spent: 35 minutes    Jeralyn Bennett  [facility] Pager [phone]. If 7PM-7AM, please contact night-coverage at [email or web address], password [facility] 11/26/2012, 11:33 AM  LOS: 4 days

## 2012-11-26 NOTE — Progress Notes (Signed)
ANTIBIOTIC and ANTICOAGULATION CONSULT NOTE - FOLLOW UP  Pharmacy Consult for Vancomycin, Zosyn, and Coumadin Indication: LLE gangrene s/p amputation; Afib  No Known Allergies  Patient Measurements: Height: 6' (182.9 cm) Weight: 350 lb (158.759 kg) IBW/kg (Calculated) : 77.6  Vital Signs: Temp: 98 F (36.7 C) (09/25 0403) Temp src: Oral (09/25 0403) BP: 102/66 mmHg (09/25 0403) Pulse Rate: 85 (09/25 0403) Intake/Output from previous day: 09/24 0701 - 09/25 0700 In: -  Out: 320 [Urine:320] Intake/Output from this shift: Total I/O In: 240 [P.O.:240] Out: -   Labs:  Recent Labs  11/24/12 0500 11/25/12 0615  WBC 7.0 5.1  HGB 9.3* 9.2*  PLT 207 202  CREATININE 1.82* 1.37*   Estimated Creatinine Clearance: 74.8 ml/min (by C-G formula based on Cr of 1.37).  Recent Labs  11/26/12 1055  VANCOTROUGH 22.4*    Lab Results  Component Value Date   INR 1.76* 11/26/2012   INR 1.28 11/25/2012   INR 1.47 11/24/2012    Microbiology: Recent Results (from the past 720 hour(s))  MRSA PCR SCREENING     Status: None   Collection Time    11/23/12  1:35 AM      Result Value Range Status   MRSA by PCR NEGATIVE  NEGATIVE Final   Comment:            The GeneXpert MRSA Assay (FDA     approved for NASAL specimens     only), is one component of a     comprehensive MRSA colonization     surveillance program. It is not     intended to diagnose MRSA     infection nor to guide or     monitor treatment for     MRSA infections.  CULTURE, BLOOD (ROUTINE X 2)     Status: None   Collection Time    11/23/12  2:10 AM      Result Value Range Status   Specimen Description BLOOD RIGHT ARM   Final   Special Requests BOTTLES DRAWN AEROBIC ONLY 10CC   Final   Culture  Setup Time     Final   Value: 11/23/2012 10:00     Performed at [facility]   Culture     Final   Value:        BLOOD CULTURE RECEIVED NO GROWTH TO DATE CULTURE WILL BE HELD FOR 5 DAYS BEFORE ISSUING A FINAL NEGATIVE  REPORT     Performed at [facility]   Report Status PENDING   Incomplete  CULTURE, BLOOD (ROUTINE X 2)     Status: None   Collection Time    11/23/12  2:15 AM      Result Value Range Status   Specimen Description BLOOD RIGHT HAND   Final   Special Requests BOTTLES DRAWN AEROBIC ONLY 5CC   Final   Culture  Setup Time     Final   Value: 11/23/2012 10:00     Performed at [facility]   Culture     Final   Value:        BLOOD CULTURE RECEIVED NO GROWTH TO DATE CULTURE WILL BE HELD FOR 5 DAYS BEFORE ISSUING A FINAL NEGATIVE REPORT     Performed at [facility]   Report Status PENDING   Incomplete  URINE CULTURE     Status: None   Collection Time    11/23/12 12:06 PM      Result Value Range Status  Specimen Description URINE, CLEAN CATCH   Final   Special Requests NONE   Final   Culture  Setup Time     Final   Value: 11/23/2012 13:30     Performed at [facility] Count     Final   Value: NO GROWTH     Performed at [facility]   Culture     Final   Value: NO GROWTH     Performed at [facility]   Report Status 11/24/2012 FINAL   Final    Anti-infectives   Start     Dose/Rate Route Frequency Ordered Stop   11/24/12 1000  vancomycin (VANCOCIN) 2,000 mg in sodium chloride 0.9 % 500 mL IVPB     2,000 mg 250 mL/hr over 120 Minutes Intravenous Every 24 hours 11/23/12 1112     11/23/12 1200  vancomycin (VANCOCIN) IVPB 1000 mg/200 mL premix     1,000 mg 200 mL/hr over 60 Minutes Intravenous  Once 11/23/12 1112 11/23/12 1254   11/23/12 1000  vancomycin (VANCOCIN) IVPB 1000 mg/200 mL premix  Status:  Discontinued     1,000 mg 200 mL/hr over 60 Minutes Intravenous Every 24 hours 11/23/12 0114 11/23/12 1112   11/23/12 0400  piperacillin-tazobactam (ZOSYN) IVPB 3.375 g     3.375 g 12.5 mL/hr over 240 Minutes Intravenous 3 times per day 11/23/12 0114     11/22/12 2100  vancomycin (VANCOCIN) 2,000 mg in sodium chloride 0.9 % 500 mL  IVPB     2,000 mg 250 mL/hr over 120 Minutes Intravenous  Once 11/22/12 2047 11/23/12 0031   11/22/12 2100  piperacillin-tazobactam (ZOSYN) IVPB 3.375 g     3.375 g 100 mL/hr over 30 Minutes Intravenous  Once 11/22/12 2047 11/22/12 2150      Assessment: 72 yo M on Coumadin PTA for hx afib.  Coumadin was held on admission for surgery to remove LLE due to gangrene.  Coumadin was restarted 9/23 and INR is trending up.  Will restart home dose.  Pt also continues on broad spectrum antibiotic coverage with  Vancomycin and Zosyn (day#5 of #14) per ID recommendations.  Renal function has continued to improve with SCr 2.78 >> 1.37.  Vancomycin level this morning was slightly above goal.  Vancomycin dose has not been given yet as patient is without IV access awaiting PICC placement.  Will adjust dose and time for this afternoon after IV access obtained.    Goal of Therapy:  Vancomycin trough level 15-20 mcg/ml INR 2-3  Plan:  1. Coumadin 5 mg PO daily. 2. Change Vancomycin to 1500 mg IV q24h - nest dose at 1600. 3. Continue Zosyn 3.375gm IV q8h. 4. Continue daily INR. 5. Will follow-up renal function and clinical progress and adjust if needed.   [employer], Pharm.D., BCPS Clinical Pharmacist Pager [phone] 11/26/2012 1:07 PM

## 2012-11-27 LAB — CBC
Platelets: 201 10*3/uL (ref 150–400)
RBC: 2.84 MIL/uL — ABNORMAL LOW (ref 4.22–5.81)
RDW: 17.2 % — ABNORMAL HIGH (ref 11.5–15.5)
WBC: 5.3 10*3/uL (ref 4.0–10.5)

## 2012-11-27 LAB — PROTIME-INR: INR: 2.5 — ABNORMAL HIGH (ref 0.00–1.49)

## 2012-11-27 LAB — GLUCOSE, CAPILLARY
Glucose-Capillary: 73 mg/dL (ref 70–99)
Glucose-Capillary: 110 mg/dL — ABNORMAL HIGH (ref 70–99)

## 2012-11-27 MED ORDER — INSULIN LISPRO 100 UNIT/ML ~~LOC~~ SOLN: 10.0000 [IU] | Freq: Three times a day (TID) | SUBCUTANEOUS | Status: DC

## 2012-11-27 MED ORDER — DOXYCYCLINE HYCLATE 100 MG PO TABS: 100.0000 mg | ORAL_TABLET | Freq: Two times a day (BID) | ORAL | Status: DC

## 2012-11-27 MED ORDER — WARFARIN SODIUM 2.5 MG PO TABS
2.5000 mg | ORAL_TABLET | Freq: Once | ORAL | Status: AC
  Administered 2012-11-27: 2.5 mg via ORAL
  Filled 2012-11-27: qty 1

## 2012-11-27 MED ORDER — OXYCODONE-ACETAMINOPHEN 5-325 MG PO TABS: 1.0000 | ORAL_TABLET | Freq: Four times a day (QID) | ORAL | Status: DC | PRN

## 2012-11-27 MED ORDER — INSULIN GLARGINE 100 UNIT/ML ~~LOC~~ SOLN: 15.0000 [IU] | Freq: Two times a day (BID) | SUBCUTANEOUS | Status: DC

## 2012-11-27 MED ORDER — WARFARIN SODIUM 5 MG PO TABS: ORAL_TABLET | ORAL | Status: DC

## 2012-11-27 MED ORDER — DOXYCYCLINE HYCLATE 100 MG PO TABS
100.0000 mg | ORAL_TABLET | Freq: Two times a day (BID) | ORAL | Status: DC
  Administered 2012-11-27: 100 mg via ORAL
  Filled 2012-11-27 (×2): qty 1

## 2012-11-27 NOTE — Progress Notes (Signed)
Physical Therapy Treatment Patient Details Name: [name] MRN: [id number] DOB: Oct 24, 1940 Today's Date: 11/27/2012 Time: 4696-2952 PT Time Calculation (min): 17 min  PT Assessment / Plan / Recommendation  History of Present Illness  Pt admit with left foot gangrene, status post below the knee amputation, procedure performed on 11/24/2012 by orthopedic surgery.     PT Comments   Pt admitted with left BKA and previous right BKA. Pt currently with functional limitations due to endurance and balance deficits.  Pt will benefit from skilled PT to increase their independence and safety with mobility to allow discharge to the venue listed below.    Follow Up Recommendations  SNF;Supervision/Assistance - 24 hour                 Equipment Recommendations  None recommended by PT        Frequency Min 3X/week   Progress towards PT Goals Progress towards PT goals: Progressing toward goals  Plan Current plan remains appropriate    Precautions / Restrictions Precautions Precautions: Fall Precaution Comments: bilateral amputee but good sitting balance.  Uses prothesis right leg but it's not here   Pertinent Vitals/Pain VSS, no pain    Mobility  Bed Mobility Bed Mobility: Rolling Right;Rolling Left;Scooting to [facility] Rolling Right: 6: Modified independent (Device/Increase time);With rail Rolling Left: 6: Modified independent (Device/Increase time);With rail Supine to Sit: Not tested (comment) Sitting - Scoot to Edge of Bed: Not tested (comment) Sit to Supine: Not Tested (comment) Scooting to [facility]: 5: Supervision;With rail Details for Bed Mobility Assistance: On arrival, pt soaked with urine.  Assisted pt by obtaining clean pads and changing wet pads and linens.  Pt encouraged once clean to sit EOB but pt anxious about going to facility and refused further therapy.   Transfers Transfers: Not assessed Ambulation/Gait Ambulation/Gait Assistance: Not tested (comment) Stairs:  No Wheelchair Mobility Wheelchair Mobility: No    Exercises Other Exercises Other Exercises: Discussed positioning to prevent contractures.    PT Goals (current goals can now be found in the care plan section)    Visit Information  Last PT Received On: 11/27/12 Assistance Needed: +1    Subjective Data  Subjective: "I am waiting to be picked up but I am wet."   Cognition  Cognition Arousal/Alertness: Awake/alert Behavior During Therapy: WFL for tasks assessed/performed Overall Cognitive Status: Within Functional Limits for tasks assessed    Balance  Static Sitting Balance Static Sitting - Level of Assistance: Not tested (comment) Dynamic Sitting Balance Dynamic Sitting - Level of Assistance: Not tested (comment)  End of Session PT - End of Session Activity Tolerance: Patient tolerated treatment well Patient left: in bed;with call bell/phone within reach        [facility] 11/27/2012, 2:46 PM  [facility] Acute Rehabilitation [phone] (pager)

## 2012-11-27 NOTE — Discharge Summary (Signed)
Physician Discharge Summary  [name] ZOX:[id number] DOB: Jan 03, 1941 DOA: 11/22/2012  PCP: Loreen Freud, DO  Admit date: 11/22/2012 Discharge date: 11/27/2012  Time spent: 40 minutes  Recommendations for Outpatient Follow-up:  1. Please check a PT/INR on 11/28/12. Pharmacy recommending Coumadin 2.5mg  PO today (11/27/12) for 2 days then 5mg  PO q daily thereafter. Would recommend biweekly PT/INR checks on Monday and Thurs, until INR is stable. He is on Coumadin for Afib.  2. Follow up on a repeat CBC on 11/30/12. He should follow up in GI as an outpatient.   Discharge Diagnoses:  Principal Problem:   Diabetic wet gangrene of the foot Active Problems:   HYPERLIPIDEMIA   UNSPECIFIED ANEMIA   HYPERTENSION   Atrial fibrillation   Acute on chronic renal failure   Discharge Condition: Stable/Improved  Diet recommendation: Heart Healthy  Filed Weights   11/22/12 1934  Weight: 158.759 kg (350 lb)    History of present illness:  72 yr old WM w/ pmhx significant for HTN, CKD stage 3 likely due to DM, hx R BKA, HL, Afib on coumadin, presents due to the above stated complaints. Per report, the patient has been having increasing weakness and confusion the past few days. He tells me the wounds on his LLE have been present for months, gradually worsening. He has seen orthopedics for this and has been getting home health. On exam, he is noted to have impressive findings. He has wet gangrene noted on L heel with multiple ulcerations throughout his L foot, with discharge.  In the ED, he was noted to have a LA of 2.46, Hgb 7.7, BP of 96/63 and there was an initial concern for early sepsis. He was bolused with IVF and seems clinically improved and more awake.  His Cr is noted to be 2.99, last known to be 1.4 on 8/27. He is also noted to have a Hgb of 7.7, last know 11.4 on 8/27. He denies any bleeding on my exam. He was noted to have a +FOBT in the ED.  The ED discussed the case with [facility]  orthopedics on call and they will be seeing him the morning   Hospital Course:  Patient is a pleasant 72 year old gentleman with multiple comorbidities, status post right transtibial amputation, history of chronic kidney disease stage III, hypertension, type 2 diabetes mellitus who was admitted to the medicine service on 11/22/2012. He presented to the emergency department with complaints of generalized weakness, malaise, with worsening appearance of chronic wounds to his left lower extremity. In emergent apartment he was found to be somewhat lethargic, having hypotension, with wet gangrene noted on left heel as well as multiple ulcerations with associated discharge. As an outpatient patient had failed foot salvage interventions and had refused amputation. It appears he had missed several appointments with his orthopedic surgeon. Dr. Lajoyce Corners of orthopedic surgery was consulted. He was taken to the OR on 11/24/2012 where he underwent below the knee amputation left lower extremity.he tolerated the procedure well as there were no immediate complications. Patient had been started on broad-spectrum empiric antibiotic therapy with IV vancomycin and Zosyn. Blood cultures were obtained and remained sterile during this hospitalization. I had discussed antibiotic therapy and duration with Dr. Ninetta Lights of infectious disease. He recommended treating for a total of 2 weeks with IV Zosyn and vancomycin which would require placement of PICC line. These recommendations were discussed with Dr. Lajoyce Corners who recommended 1 week of oral doxycycline therapy to be sufficient. I went over the risks and  benefits of a PICC line placement as well as IV versus oral antibiotics with patient. He expressed his desire to go with one week of oral doxycycline therapy, as it seems he had been apprehensive about undergoing PICC line placement from the beginning.other issues during this hospitalization, patient found to be anemic, have a hemoglobin of 7.3  on 11/23/2012, receiving blood transfusion with 2 units. This will need to be followed up in the outpatient setting as his Coumadin has been restarted as part as thromboembolic prophylaxis with underlying atrial fibrillation. He has multiple risk factors for thromboembolism. I would recommend patient follow up with GI in the outpatient setting or possible colonoscopy.  Given poor living situation at home,as well as having increased needs, recommendation for skilled nursing facility placement was made. He accepted, was discharged to skilled nursing facility on 11/27/2012.  Procedures:  Status post below the knee amputation of his left lower extremity, procedure performed by Dr. Lajoyce Corners of orthopedic surgery on 11/24/2012  Consultations:  Orthopedic surgery  Discharge Exam: Filed Vitals:   11/27/12 0501  BP: 117/76  Pulse: 84  Temp: 98.2 F (36.8 C)  Resp: 18   Discharge Instructions     Medication List    ASK your doctor about these medications       allopurinol 100 MG tablet  Commonly known as:  ZYLOPRIM  Take 100 mg by mouth daily.     ascorbic acid 1000 MG tablet  Commonly known as:  VITAMIN C  Take 1,000 mg by mouth daily.     diltiazem 240 MG 24 hr capsule  Commonly known as:  DILACOR XR  Take 1 capsule (240 mg total) by mouth 2 (two) times daily.     fenofibrate 160 MG tablet  Take 1 tablet (160 mg total) by mouth daily.     furosemide 20 MG tablet  Commonly known as:  LASIX  Take 20 mg by mouth.     gabapentin 300 MG capsule  Commonly known as:  NEURONTIN  Take 1 capsule (300 mg total) by mouth 3 (three) times daily as needed (for nerve pain.).     insulin glargine 100 UNIT/ML injection  Commonly known as:  LANTUS  Inject 0.3 mLs (30 Units total) into the skin 2 (two) times daily.     insulin lispro 100 UNIT/ML injection  Commonly known as:  HUMALOG  Inject 25 Units into the skin 3 (three) times daily before meals.     levothyroxine 200 MCG tablet   Commonly known as:  SYNTHROID, LEVOTHROID  Take 1 tablet (200 mcg total) by mouth daily before breakfast.     multivitamin with minerals Tabs tablet  Take 1 tablet by mouth daily.     nystatin powder  Commonly known as:  MYCOSTATIN  Apply 1 g topically See admin instructions. Apply 2-3 times daily     potassium chloride 10 MEQ tablet  Commonly known as:  K-DUR,KLOR-CON  Take 10 mEq by mouth daily.     rOPINIRole 2 MG tablet  Commonly known as:  REQUIP  Take 2 mg by mouth at bedtime.     rosuvastatin 20 MG tablet  Commonly known as:  CRESTOR  Take 20 mg by mouth daily.     silver sulfADIAZINE 1 % cream  Commonly known as:  SILVADENE  Apply 1 application topically 2 (two) times daily.     tamsulosin 0.4 MG Caps capsule  Commonly known as:  FLOMAX  Take 1 capsule (0.4 mg total) by mouth  daily.     VICODIN ES 7.5-300 MG Tabs  Generic drug:  Hydrocodone-Acetaminophen  Take 1 tablet by mouth every 6 (six) hours as needed (for pain).     VICTOZA 18 MG/3ML Soln injection  Generic drug:  Liraglutide  Inject 1.2 mg into the skin daily.     warfarin 5 MG tablet  Commonly known as:  COUMADIN  Take 1 tablet (5 mg total) by mouth daily.       No Known Allergies     Follow-up Information   Follow up with DUDA,MARCUS V, MD In 2 weeks.   Specialty:  Orthopedic Surgery   Contact information:   [address] [place] [address] [phone]        The results of significant diagnostics from this hospitalization (including imaging, microbiology, ancillary and laboratory) are listed below for reference.    Significant Diagnostic Studies: Dg Foot Complete Left  11/22/2012   CLINICAL DATA:  Multiple foot wounds.  EXAM: LEFT FOOT - COMPLETE 3+ VIEW  COMPARISON:  Radiographs 06/03/2012. MRI 06/04/2012.  FINDINGS: The bones are diffusely demineralized. There is a large ulcer over the heel with resulting soft tissue emphysema and possible early destruction of the  calcaneal tuberosity. No other bone destruction is identified. There are posttraumatic deformities within the metatarsals with a pseudoarthrosis between the 3rd and 4th metatarsals. No other bone destruction is evident.  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   Electronically Signed   By: Roxy Horseman   On: 11/22/2012 21:59    Microbiology: Recent Results (from the past 240 hour(s))  MRSA PCR SCREENING     Status: None   Collection Time    11/23/12  1:35 AM      Result Value Range Status   MRSA by PCR NEGATIVE  NEGATIVE Final   Comment:            The GeneXpert MRSA Assay (FDA     approved for NASAL specimens     only), is one component of a     comprehensive MRSA colonization     surveillance program. It is not     intended to diagnose MRSA     infection nor to guide or     monitor treatment for     MRSA infections.  CULTURE, BLOOD (ROUTINE X 2)     Status: None   Collection Time    11/23/12  2:10 AM      Result Value Range Status   Specimen Description BLOOD RIGHT ARM   Final   Special Requests BOTTLES DRAWN AEROBIC ONLY 10CC   Final   Culture  Setup Time     Final   Value: 11/23/2012 10:00     Performed at [facility]   Culture     Final   Value:        BLOOD CULTURE RECEIVED NO GROWTH TO DATE CULTURE WILL BE HELD FOR 5 DAYS BEFORE ISSUING A FINAL NEGATIVE REPORT     Performed at [facility]   Report Status PENDING   Incomplete  CULTURE, BLOOD (ROUTINE X 2)     Status: None   Collection Time    11/23/12  2:15 AM      Result Value Range Status   Specimen Description BLOOD RIGHT HAND   Final   Special Requests BOTTLES DRAWN AEROBIC ONLY 5CC   Final   Culture  Setup Time  Final   Value: 11/23/2012 10:00     Performed at [facility]   Culture     Final   Value:        BLOOD CULTURE RECEIVED NO GROWTH TO DATE CULTURE WILL BE HELD FOR 5 DAYS BEFORE ISSUING A FINAL  NEGATIVE REPORT     Performed at [facility]   Report Status PENDING   Incomplete  URINE CULTURE     Status: None   Collection Time    11/23/12 12:06 PM      Result Value Range Status   Specimen Description URINE, CLEAN CATCH   Final   Special Requests NONE   Final   Culture  Setup Time     Final   Value: 11/23/2012 13:30     Performed at [facility] Count     Final   Value: NO GROWTH     Performed at [facility]   Culture     Final   Value: NO GROWTH     Performed at [facility]   Report Status 11/24/2012 FINAL   Final     Labs: Basic Metabolic Panel:  Recent Labs Lab 11/22/12 2026 11/23/12 0210 11/24/12 0500 11/25/12 0615  NA 126* 127* 130* 137  K 4.1 3.7 3.9 3.7  CL 90* 92* 94* 102  CO2 23 27 23 28   GLUCOSE 269* 200* 190* 158*  BUN 48* 49* 49* 32*  CREATININE 2.99* 2.78* 1.82* 1.37*  CALCIUM 8.5 8.0* 8.5 8.4   Liver Function Tests:  Recent Labs Lab 11/22/12 2026 11/23/12 0210  AST 65* 52*  ALT 18 17  ALKPHOS 36* 32*  BILITOT 0.7 0.7  PROT 6.8 6.6  ALBUMIN 2.4* 2.4*   No results found for this basename: LIPASE, AMYLASE,  in the last 168 hours No results found for this basename: AMMONIA,  in the last 168 hours CBC:  Recent Labs Lab 11/22/12 2026 11/23/12 0210 11/24/12 0500 11/25/12 0615 11/27/12 0500  WBC 9.9 7.6 7.0 5.1 5.3  NEUTROABS 8.5*  --   --   --   --   HGB 7.7* 7.3* 9.3* 9.2* 8.1*  HCT 23.3* 22.2* 28.4* 28.7* 26.2*  MCV 86.9 86.7 86.3 90.0 92.3  PLT 200 211 207 202 201   Cardiac Enzymes:  Recent Labs Lab 11/22/12 2026  TROPONINI <0.30   BNP: BNP (last 3 results) No results found for this basename: PROBNP,  in the last 8760 hours CBG:  Recent Labs Lab 11/26/12 0802 11/26/12 1059 11/26/12 1603 11/26/12 2037 11/27/12 0614  GLUCAP 110* 115* 157* 149* 73       Signed:  Sandrine Bloodsworth  [facility] 11/27/2012, 11:31 AM

## 2012-11-27 NOTE — Progress Notes (Signed)
Patient ID: [name], male   DOB: 16-Jan-1941, 72 y.o.   MRN: [id number] Patient states that he feels sore otherwise has no complaints. I will followup with the patient after discharge.

## 2012-11-27 NOTE — Progress Notes (Addendum)
2:00 CSW spoke with daughter and patient. Both stated they are in agreement to discharge to [facility] by EMS transport.   Clinical Social Worker provided patient and daughter with skilled nursing facility bed offers. Patient appears open to SNF option and gave CSW permission to follow up with [facility].  CSW to follow up with facility, insurance company and SNF plans.  CSW awaiting insurance approval.   Maree Krabbe, MSW, Theresia Majors [phone]

## 2012-11-27 NOTE — Progress Notes (Signed)
ANTICOAGULATION CONSULT NOTE - FOLLOW UP  Pharmacy Consult for Coumadin Indication: Afib  No Known Allergies  Patient Measurements: Height: 6' (182.9 cm) Weight: 350 lb (158.759 kg) IBW/kg (Calculated) : 77.6  Vital Signs: Temp: 98.2 F (36.8 C) (09/26 0501) Temp src: Oral (09/26 0501) BP: 117/76 mmHg (09/26 0501) Pulse Rate: 84 (09/26 0501) Intake/Output from previous day: 09/25 0701 - 09/26 0700 In: 1840 [P.O.:840; IV Piggyback:1000] Out: 325 [Urine:325] Intake/Output from this shift:    Labs:  Recent Labs  11/25/12 0615 11/27/12 0500  WBC 5.1 5.3  HGB 9.2* 8.1*  PLT 202 201  CREATININE 1.37*  --    Estimated Creatinine Clearance: 74.8 ml/min (by C-G formula based on Cr of 1.37).  Recent Labs  11/26/12 1055  VANCOTROUGH 22.4*    Lab Results  Component Value Date   INR 2.50* 11/27/2012   INR 1.76* 11/26/2012   INR 1.28 11/25/2012   Assessment: 72 yo M on Coumadin PTA for hx afib.  Coumadin was held on admission for L BKA 9/23. Coumadin restarted 9/23 and INR therapeutic (2.5) today. Large jump in INR past 24 hours. (INR therapeutic 2.21 from home on 5mg  daily). Pt also started on doxycycline which may potentiate effects of warfarin. No bleeding noted. Noted pt may go to SNF today.  Goal of Therapy:  INR 2-3  Plan:  1. Coumadin 2.5 mg PO today. D/c 5mg  daily for now. 2. Continue daily INR. 3. If pt d/c to SNF today, suggest 2.5mg  daily x 2 days then 5mg  as directed with INR recheck 9/27 or 9/28.  Christoper Fabian, PharmD, BCPS Clinical pharmacist, pager [phone] 11/27/2012 10:40 AM

## 2012-11-27 NOTE — Progress Notes (Signed)
I discussed the need for antibiotic therapy with Dr Lajoyce Corners of Orthopedic surgery. He recommended 1 week of oral doxycycline therapy rather then IV vancomycin and zosyn. I presented these treatment options to patient, discussing risks and benefits of PICC line placement, oral vs IV antibiotics. Patient expressed his desire to not undergo PICC line placement, and prefers to take 1 week of oral doxycycline.

## 2012-11-27 NOTE — Progress Notes (Signed)
[facility] PROGRESS NOTE  [name] ZOX:[id number] DOB: 1941/02/02 DOA: 11/22/2012 PCP: Loreen Freud, DO  Assessment/Plan: 1. Left foot gangrene, status post below the knee amputation, procedure performed on 11/24/2012 by orthopedic surgery. I discussed antibiotic regimen with Dr Ninetta Lights of infectious disease, who recommended 2 weeks of IV Zosyn and Vancomycin.  2. Atrial fibrillation. Continue Cardizem 180 mg by mouth twice a day, he is rate controlled. He was restarted on Coumadin. 3. Acute on chronic anemia. Patient receiving 2 units of packed red blood cells, repeat Hg at 8.1 on this morning's labs. Will check a stool for hemoccult.  4. Acute on chronic renal failure. Patient's creatinine coming down from 2.78 on 11/21/2012 to 1.37 on 11/25/12. I suspect this could be secondary to volume depletion. 5. Insulin-dependent diabetes mellitus. Continue Lantus 15 units subcutaneous twice a day. Continue Accu-Cheks q. a.c. and each bedtime with sliding scale coverage 6. Hyponatremia. Likely secondary to hypovolemia. Sodium improved to 137 from 130 yesterday 7. Hypothyroidism. Continue Synthroid 200 mcg by mouth daily 8. Dyslipidemia. Continue statin therapy   Code Status: Full code Family Communication: Plan discussed with patient Disposition Plan: Plan to transition patient to skilled nursing facility for rehabilitation. He is agreeable to this plan. Dr. Ninetta Lights recommending 2 weeks of IV antibiotics with Vancomycin and Zosyn.    Consultants:  Orthopedic surgery  Procedures:  Low the knee amputation 2 left lower extremity  Antibiotics:  Vancomycin  Zosyn  HPI/Subjective: Patient reporting ongoing soreness to his amputation site, otherwise doing well. He is tolerating PO intake, remains afebrile.    Objective: Filed Vitals:   11/27/12 0501  BP: 117/76  Pulse: 84  Temp: 98.2 F (36.8 C)  Resp: 18    Intake/Output Summary (Last 24 hours) at 11/27/12 0805 Last  data filed at 11/27/12 0502  Gross per 24 hour  Intake   1720 ml  Output    325 ml  Net   1395 ml   Filed Weights   11/22/12 1934  Weight: 158.759 kg (350 lb)    Exam:   General:  Patient is in no acute distress he is awake alert oriented  Cardiovascular: Regular rate rhythm normal S1-S2  Respiratory: Lungs are clear to auscultation bilaterally no wheezing rhonchi or rales  Abdomen: Obese soft nontender nondistended positive bowel  Extremities: Status post below the knee amputation to his left lower extremity, 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   Data Reviewed: Basic Metabolic Panel:  Recent Labs Lab 11/22/12 2026 11/23/12 0210 11/24/12 0500 11/25/12 0615  NA 126* 127* 130* 137  K 4.1 3.7 3.9 3.7  CL 90* 92* 94* 102  CO2 23 27 23 28   GLUCOSE 269* 200* 190* 158*  BUN 48* 49* 49* 32*  CREATININE 2.99* 2.78* 1.82* 1.37*  CALCIUM 8.5 8.0* 8.5 8.4   Liver Function Tests:  Recent Labs Lab 11/22/12 2026 11/23/12 0210  AST 65* 52*  ALT 18 17  ALKPHOS 36* 32*  BILITOT 0.7 0.7  PROT 6.8 6.6  ALBUMIN 2.4* 2.4*   No results found for this basename: LIPASE, AMYLASE,  in the last 168 hours No results found for this basename: AMMONIA,  in the last 168 hours CBC:  Recent Labs Lab 11/22/12 2026 11/23/12 0210 11/24/12 0500 11/25/12 0615 11/27/12 0500  WBC 9.9 7.6 7.0 5.1 5.3  NEUTROABS 8.5*  --   --   --   --   HGB 7.7* 7.3* 9.3* 9.2* 8.1*  HCT 23.3* 22.2*  28.4* 28.7* 26.2*  MCV 86.9 86.7 86.3 90.0 92.3  PLT 200 211 207 202 201   Cardiac Enzymes:  Recent Labs Lab 11/22/12 2026  TROPONINI <0.30   BNP (last 3 results) No results found for this basename: PROBNP,  in the last 8760 hours CBG:  Recent Labs Lab 11/26/12 0802 11/26/12 1059 11/26/12 1603 11/26/12 2037 11/27/12 0614  GLUCAP 110* 115* 157* 149* 73    Recent Results (from the past 240 hour(s))  MRSA PCR SCREENING     Status: None   Collection Time     11/23/12  1:35 AM      Result Value Range Status   MRSA by PCR NEGATIVE  NEGATIVE Final   Comment:            The GeneXpert MRSA Assay (FDA     approved for NASAL specimens     only), is one component of a     comprehensive MRSA colonization     surveillance program. It is not     intended to diagnose MRSA     infection nor to guide or     monitor treatment for     MRSA infections.  CULTURE, BLOOD (ROUTINE X 2)     Status: None   Collection Time    11/23/12  2:10 AM      Result Value Range Status   Specimen Description BLOOD RIGHT ARM   Final   Special Requests BOTTLES DRAWN AEROBIC ONLY 10CC   Final   Culture  Setup Time     Final   Value: 11/23/2012 10:00     Performed at [facility]   Culture     Final   Value:        BLOOD CULTURE RECEIVED NO GROWTH TO DATE CULTURE WILL BE HELD FOR 5 DAYS BEFORE ISSUING A FINAL NEGATIVE REPORT     Performed at [facility]   Report Status PENDING   Incomplete  CULTURE, BLOOD (ROUTINE X 2)     Status: None   Collection Time    11/23/12  2:15 AM      Result Value Range Status   Specimen Description BLOOD RIGHT HAND   Final   Special Requests BOTTLES DRAWN AEROBIC ONLY 5CC   Final   Culture  Setup Time     Final   Value: 11/23/2012 10:00     Performed at [facility]   Culture     Final   Value:        BLOOD CULTURE RECEIVED NO GROWTH TO DATE CULTURE WILL BE HELD FOR 5 DAYS BEFORE ISSUING A FINAL NEGATIVE REPORT     Performed at [facility]   Report Status PENDING   Incomplete  URINE CULTURE     Status: None   Collection Time    11/23/12 12:06 PM      Result Value Range Status   Specimen Description URINE, CLEAN CATCH   Final   Special Requests NONE   Final   Culture  Setup Time     Final   Value: 11/23/2012 13:30     Performed at [facility] Count     Final   Value: NO GROWTH     Performed at [facility]   Culture     Final   Value: NO GROWTH     Performed at [facility]   Report Status 11/24/2012 FINAL   Final  Studies: No results found.  Scheduled Meds: . allopurinol  100 mg Oral Daily  . atorvastatin  40 mg Oral q1800  . diltiazem  180 mg Oral BID  . insulin aspart  0-9 Units Subcutaneous TID WC & HS  . insulin glargine  15 Units Subcutaneous BID  . levothyroxine  200 mcg Oral QAC breakfast  . nutrition supplement  1 packet Oral BID BM  . piperacillin-tazobactam (ZOSYN)  IV  3.375 g Intravenous Q8H  . sodium chloride  500 mL Intravenous Once  . sodium chloride  3 mL Intravenous Q12H  . vancomycin  1,500 mg Intravenous Q24H  . warfarin  5 mg Oral q1800  . Warfarin - Pharmacist Dosing Inpatient   Does not apply q1800   Continuous Infusions:    Principal Problem:   Diabetic wet gangrene of the foot Active Problems:   HYPERLIPIDEMIA   UNSPECIFIED ANEMIA   HYPERTENSION   Atrial fibrillation   Acute on chronic renal failure    Time spent: 35 minutes    Jeralyn Bennett  [facility] Pager [phone]. If 7PM-7AM, please contact night-coverage at [email or web address], password [facility] 11/27/2012, 8:05 AM  LOS: 5 days

## 2012-11-27 NOTE — Progress Notes (Signed)
Pt discharged to snf via ems. Pt without complaints. Discharge instruction given to ems.

## 2012-11-27 NOTE — Progress Notes (Signed)
Clinical Social Work Department CLINICAL SOCIAL WORK PLACEMENT NOTE 11/27/2012  Patient:  [name], [name]  Account Number:  [id number] Admit date:  11/22/2012  Clinical Social Worker:  Carren Rang  Date/time:  11/25/2012 03:07 PM  Clinical Social Work is seeking post-discharge placement for this patient at the following level of care:   SKILLED NURSING   (*CSW will update this form in Epic as items are completed)   11/25/2012  Patient/family provided with [facility] Department of Clinical Social Work's list of facilities offering this level of care within the geographic area requested by the patient (or if unable, by the patient's family).  11/25/2012  Patient/family informed of their freedom to choose among providers that offer the needed level of care, that participate in Medicare, Medicaid or managed care program needed by the patient, have an available bed and are willing to accept the patient.  11/25/2012  Patient/family informed of [facility]' ownership interest in [facility], as well as of the fact that they are under no obligation to receive care at this facility.  PASARR submitted to EDS on 11/24/2012 PASARR number received from [facility] on 11/24/2012  FL2 transmitted to all facilities in geographic area requested by pt/family on  11/24/2012 FL2 transmitted to all facilities within larger geographic area on   Patient informed that his/her managed care company has contracts with or will negotiate with  certain facilities, including the following:     Patient/family informed of bed offers received:  11/27/2012 Patient chooses bed at [facility] [place] Physician recommends and patient chooses bed at    Patient to be transferred to [facility] [place] on  11/27/2012 Patient to be transferred to facility by EMS  The following physician request were entered in Epic:   Additional Comments:   Maree Krabbe, MSW,  [employer] [phone]

## 2012-11-27 NOTE — Progress Notes (Signed)
Clinical Social Worker facilitated patient discharge by contacting the patient, family and facility,  Patient agreeable to this plan and arranging transport via EMS.  CSW will sign off, as social work intervention is no longer needed. CSW received authorization and called [facility] for transportation. EMS Berkley Harvey [phone].   Maree Krabbe, MSW, Theresia Majors [phone]

## 2012-11-29 LAB — CULTURE, BLOOD (ROUTINE X 2): Culture: NO GROWTH

## 2012-11-30 ENCOUNTER — Encounter: Payer: Self-pay | Admitting: Internal Medicine

## 2012-11-30 ENCOUNTER — Other Ambulatory Visit: Payer: Self-pay | Admitting: *Deleted

## 2012-11-30 ENCOUNTER — Non-Acute Institutional Stay (SKILLED_NURSING_FACILITY): Payer: Medicare Other | Admitting: Internal Medicine

## 2012-11-30 DIAGNOSIS — S88119A Complete traumatic amputation at level between knee and ankle, unspecified lower leg, initial encounter: Secondary | ICD-10-CM

## 2012-11-30 DIAGNOSIS — D649 Anemia, unspecified: Secondary | ICD-10-CM

## 2012-11-30 DIAGNOSIS — E785 Hyperlipidemia, unspecified: Secondary | ICD-10-CM

## 2012-11-30 DIAGNOSIS — I10 Essential (primary) hypertension: Secondary | ICD-10-CM

## 2012-11-30 DIAGNOSIS — Z89519 Acquired absence of unspecified leg below knee: Secondary | ICD-10-CM | POA: Insufficient documentation

## 2012-11-30 DIAGNOSIS — N189 Chronic kidney disease, unspecified: Secondary | ICD-10-CM

## 2012-11-30 DIAGNOSIS — Z89512 Acquired absence of left leg below knee: Secondary | ICD-10-CM

## 2012-11-30 DIAGNOSIS — E039 Hypothyroidism, unspecified: Secondary | ICD-10-CM

## 2012-11-30 DIAGNOSIS — I4891 Unspecified atrial fibrillation: Secondary | ICD-10-CM

## 2012-11-30 DIAGNOSIS — E1129 Type 2 diabetes mellitus with other diabetic kidney complication: Secondary | ICD-10-CM

## 2012-11-30 DIAGNOSIS — N179 Acute kidney failure, unspecified: Secondary | ICD-10-CM

## 2012-11-30 DIAGNOSIS — N058 Unspecified nephritic syndrome with other morphologic changes: Secondary | ICD-10-CM

## 2012-11-30 MED ORDER — HYDROCODONE-ACETAMINOPHEN 7.5-300 MG PO TABS: ORAL_TABLET | ORAL | Status: DC

## 2012-11-30 NOTE — Assessment & Plan Note (Addendum)
L leg 11/24/2012 for wet gangrene;had been following ortho as out pt;pt is on doxycycline  9/26 - 10/3; TODAY PT NOTED THAT STUMP IS RED, BUT NOT MORE TENDER THAN BEFORE  Called Dr Lajoyce Corners at his office ;spoke with triage nurse there and she will speak with Dr Lajoyce Corners and call me back-I would suggest the PICC line and antibiotics which were considered at the hospital  12:29- I spoke with Dr Lajoyce Corners return call-said to switch to septra and have wound nurse use compression wrap like coflex

## 2012-11-30 NOTE — Assessment & Plan Note (Signed)
Will continue replacement 

## 2012-11-30 NOTE — Assessment & Plan Note (Signed)
Pt on insulin and Victoza; con cho diet and CBG ac and qHS

## 2012-11-30 NOTE — Assessment & Plan Note (Signed)
Continue Crestor and fenofibrate 

## 2012-11-30 NOTE — Assessment & Plan Note (Signed)
Pt was noted to have HB 7.7-received 2 units blood in hospital with rec for out pt work up with GI and that has been ordered by me;pt to have CBC drawn today.

## 2012-11-30 NOTE — Progress Notes (Signed)
MRN: [id number] Name: [name]  Sex: male Age: 72 y.o. DOB: [birth year]  PSC #: Ronni Rumble  Facility/Room:  125B Level Of Care: SNF Provider: Merrilee Seashore D Emergency Contacts: Extended Emergency Contact Information Primary Emergency Contact: Harris,Pat  [place] Mobile Phone: [phone] Relation: Daughter  Code Status:   Allergies: Review of patient's allergies indicates no known allergies.  Chief Complaint  Patient presents with  . nursing home admission    HPI: Patient is 72 y.o. male who is admitted s/p Left BKA. Pt already has R BKA.  Past Medical History  Diagnosis Date  . Depression   . Hyperlipidemia   . Hypertension   . Chronic kidney disease   . CHF (congestive heart failure)   . Sleep apnea   . Gout   . Foot ulcer, left 06/04/2012    dorsum   . Type II diabetes mellitus   . History of blood transfusion     "counting this am I've had blood probably 3 times" (11/23/2012)  . Osteoarthritis     "legs sometimes" (11/23/2012)  . S/P BKA (below knee amputation) unilateral     Past Surgical History  Procedure Laterality Date  . Leg amputation below knee Right   . Orchiectomy Left     "I think it's the left" (11/23/2012)  . Shoulder open rotator cuff repair Left   . Inguinal hernia repair  1990's?    "? side" (11/23/2012)  . Cataract extraction w/ intraocular lens  implant, bilateral Bilateral ~2010  . I&d extremity Left 06/05/2012    Procedure: IRRIGATION AND DEBRIDEMENT LT ANKLE ;  Surgeon: Nadara Mustard, MD;  Location: [facility] OR;  Service: Orthopedics;  Laterality: Left;  . Application of wound vac Left 06/05/2012    Procedure: APPLICATION OF WOUND VAC;  Surgeon: Nadara Mustard, MD;  Location: [facility] OR;  Service: Orthopedics;  Laterality: Left;  . Apligraft placement Left 06/10/2012    Procedure: Left ankle skin graft;  Surgeon: Nadara Mustard, MD;  Location: [facility] OR;  Service: Orthopedics;  Laterality: Left;  . Application of wound vac Left  06/10/2012    Procedure: APPLICATION OF WOUND VAC;  Surgeon: Nadara Mustard, MD;  Location: [facility] OR;  Service: Orthopedics;  Laterality: Left;  . Amputation Left 11/24/2012    Procedure: AMPUTATION BELOW KNEE;  Surgeon: Nadara Mustard, MD;  Location: [facility] OR;  Service: Orthopedics;  Laterality: Left;  Left Below Knee Amputation      Medication List       This list is accurate as of: 11/30/12 11:48 AM.  Always use your most recent med list.               allopurinol 100 MG tablet  Commonly known as:  ZYLOPRIM  Take 100 mg by mouth daily.     diltiazem 240 MG 24 hr capsule  Commonly known as:  DILACOR XR  Take 1 capsule (240 mg total) by mouth 2 (two) times daily.     doxycycline 100 MG tablet  Commonly known as:  VIBRA-TABS  Take 100 mg by mouth every 12 (twelve) hours.     fenofibrate 160 MG tablet  Take 1 tablet (160 mg total) by mouth daily.     furosemide 20 MG tablet  Commonly known as:  LASIX  Take 20 mg by mouth daily.     gabapentin 300 MG capsule  Commonly known as:  NEURONTIN  Take 1 capsule (300 mg total) by mouth 3 (three) times  daily as needed (for nerve pain.).     insulin glargine 100 UNIT/ML injection  Commonly known as:  LANTUS  Inject 30 Units into the skin 2 (two) times daily.     insulin lispro 100 UNIT/ML injection  Commonly known as:  HUMALOG  Inject 25 Units into the skin 3 (three) times daily before meals.     levothyroxine 200 MCG tablet  Commonly known as:  SYNTHROID, LEVOTHROID  Take 1 tablet (200 mcg total) by mouth daily before breakfast.     multivitamin with minerals Tabs tablet  Take 1 tablet by mouth daily.     nystatin powder  Commonly known as:  MYCOSTATIN  Apply 1 g topically See admin instructions. Apply 2-3 times daily     oxyCODONE-acetaminophen 5-325 MG per tablet  Commonly known as:  PERCOCET/ROXICET  Take 1-2 tablets by mouth every 6 (six) hours as needed.     potassium chloride 10 MEQ tablet  Commonly known as:  K-DUR   Take 10 mEq by mouth daily.     rOPINIRole 2 MG tablet  Commonly known as:  REQUIP  Take 2 mg by mouth at bedtime.     rosuvastatin 20 MG tablet  Commonly known as:  CRESTOR  Take 20 mg by mouth daily.     silver sulfADIAZINE 1 % cream  Commonly known as:  SILVADENE  Apply 1 application topically 2 (two) times daily.     tamsulosin 0.4 MG Caps capsule  Commonly known as:  FLOMAX  Take 1 capsule (0.4 mg total) by mouth daily.     VICTOZA 18 MG/3ML Sopn  Generic drug:  Liraglutide  Inject 1.2 mg into the skin daily.     vitamin C 1000 MG tablet  Take 1,000 mg by mouth daily.     warfarin 5 MG tablet  Commonly known as:  COUMADIN  Take 2.5 mg today 11/27/12 and tomorrow, then 5mg  PO q daily thereafter. Follow up on PT/INR check 11/28/12        Meds ordered this encounter  Medications  . furosemide (LASIX) 20 MG tablet    Sig: Take 20 mg by mouth daily.  . insulin glargine (LANTUS) 100 UNIT/ML injection    Sig: Inject 30 Units into the skin 2 (two) times daily.  . insulin lispro (HUMALOG) 100 UNIT/ML injection    Sig: Inject 25 Units into the skin 3 (three) times daily before meals.  . Liraglutide (VICTOZA) 18 MG/3ML SOPN    Sig: Inject 1.2 mg into the skin daily.  . Ascorbic Acid (VITAMIN C) 1000 MG tablet    Sig: Take 1,000 mg by mouth daily.  . potassium chloride (K-DUR) 10 MEQ tablet    Sig: Take 10 mEq by mouth daily.  . silver sulfADIAZINE (SILVADENE) 1 % cream    Sig: Apply 1 application topically 2 (two) times daily.  Marland Kitchen doxycycline (VIBRA-TABS) 100 MG tablet    Sig: Take 100 mg by mouth every 12 (twelve) hours.    Immunization History  Administered Date(s) Administered  . Influenza Split 12/19/2010  . Influenza Whole 11/16/2008  . Influenza,inj,Quad PF,36+ Mos 11/25/2012  . Pneumococcal Polysaccharide 01/18/2009  . Td 01/11/2009  . Zoster 09/03/2006    History  Substance Use Topics  . Smoking status: Former Smoker -- 1.50 packs/day for 45 years     Types: Cigarettes  . Smokeless tobacco: Never Used     Comment: 11/23/2012 "quit smoking 15-20 yr ago"  . Alcohol Use: No  Comment: 11/23/2012 "used to drink alot of whiskey; quit 15-20 yr ago"        Review of Systems  DATA OBTAINED: from patient GENERAL: Feels well no fevers, fatigue, appetite changes SKIN: No itching, rash; admits noted that stump was red today; not more tender than usual EYES: No eye pain, redness, discharge EARS: No earache, tinnitus, change in hearing NOSE: No congestion, drainage or bleeding  MOUTH/THROAT: No mouth or tooth pain, No sore throat, No difficulty chewing or swallowing  RESPIRATORY: No cough, wheezing, SOB CARDIAC: No chest pain, palpitations, lower extremity edema  GI: No abdominal pain, No N/V/D or constipation, No heartburn or reflux  GU: No dysuria, frequency or urgency, or incontinence  MUSCULOSKELETAL: No unrelieved bone/joint pain NEUROLOGIC: Awake, alert, appropriate to situation, No change in mental status. Moves all four, no focal deficits PSYCHIATRIC: No overt anxiety or sadness. Sleeps well. No behavior issue.     Filed Vitals:   11/30/12 1119  BP: 103/58  Pulse: 99  Temp: 96.8 F (36 C)  Resp: 22    Physical Exam  GENERAL APPEARANCE: Alert, conversant. Appropriately groomed.Obese  SKIN: No diaphoresis rash; L STUMP IS RED AND WARM HEAD: Normocephalic, atraumatic  EYES: Conjunctiva/lids clear. Pupils round, reactive. EOMs intact.  EARS: External exam WNL, canals clear. Hearing grossly normal.  NOSE: No deformity or discharge.  MOUTH/THROAT: Lips w/o lesions RESPIRATORY: Breathing is even, unlabored. Lung sounds are clear   CARDIOVASCULAR: Heart RRR no murmurs, rubs or gallops. No peripheral edema.  ARTERIAL: radial pulse 2+, DP pulse 1+  VENOUS: No varicosities. No venous stasis skin changes  GASTROINTESTINAL: Abdomen is soft, non-tender, not distended w/ normal bowel sounds. No mass, ventral or inguinal hernia. No  organomegally GENITOURINARY: Bladder non tender, not distended  MUSCULOSKELETAL:OLD R BKA; l bka NEW NEUROLOGIC: Oriented X3. Cranial nerves 2-12 grossly intact. Moves all extremities no tremor. PSYCHIATRIC: Mood and affect appropriate to situation, no behavioral issues  Patient Active Problem List   Diagnosis Date Noted  . S/P BKA (below knee amputation) unilateral   . Diabetic wet gangrene of the foot 11/22/2012  . Diabetic osteomyelitis 06/04/2012  . Hyponatremia 06/04/2012  . Hypokalemia 06/04/2012  . Acute on chronic renal failure 06/04/2012  . Atrial fibrillation 08/31/2010  . Cardiac arrest 08/31/2010  . Cellulitis 08/31/2010  . Acute osteomyelitis, ankle and foot 02/06/2010  . UNSPECIFIED ANEMIA 01/11/2010  . CLOSTRIDIUM DIFFICILE COLITIS, HX OF 01/11/2010  . DECUBITUS ULCER, BUTTOCK 11/02/2009  . ANEMIA, PERNICIOUS 10/05/2009  . CONJUNCTIVITIS, BACTERIAL 08/08/2009  . SLEEP APNEA 02/06/2009  . BKA, RIGHT LEG 01/05/2009  . OSTEOARTHRITIS 06/18/2007  . HYPOTHYROIDISM NOS 09/03/2006  . DM (diabetes mellitus) type II controlled with renal manifestation 06/11/2006  . B12 DEFICIENCY 06/11/2006  . HYPERLIPIDEMIA 06/11/2006  . GOUT 06/11/2006  . DEPRESSION 06/11/2006  . HYPERTENSION 06/11/2006  . RENAL INSUFFICIENCY 06/11/2006  . RESTLESS LEG SYNDROME, HX OF 06/11/2006    Functional assessment:   CBC    Component Value Date/Time   WBC 5.3 11/27/2012 0500   RBC 2.84* 11/27/2012 0500   RBC 2.70* 12/30/2009 0930   HGB 8.1* 11/27/2012 0500   HCT 26.2* 11/27/2012 0500   PLT 201 11/27/2012 0500   MCV 92.3 11/27/2012 0500   LYMPHSABS 0.7 11/22/2012 2026   MONOABS 0.7 11/22/2012 2026   EOSABS 0.0 11/22/2012 2026   BASOSABS 0.0 11/22/2012 2026    CMP     Component Value Date/Time   NA 137 11/25/2012 0615   K 3.7 11/25/2012  0615   CL 102 11/25/2012 0615   CO2 28 11/25/2012 0615   GLUCOSE 158* 11/25/2012 0615   GLUCOSE 135* 12/17/2005 1011   BUN 32* 11/25/2012 0615   CREATININE  1.37* 11/25/2012 0615   CALCIUM 8.4 11/25/2012 0615   PROT 6.6 11/23/2012 0210   ALBUMIN 2.4* 11/23/2012 0210   AST 52* 11/23/2012 0210   ALT 17 11/23/2012 0210   ALKPHOS 32* 11/23/2012 0210   BILITOT 0.7 11/23/2012 0210   GFRNONAA 50* 11/25/2012 0615   GFRAA 57* 11/25/2012 0615    Assessment and Plan  S/P BKA (below knee amputation) unilateral L leg 11/24/2012 for wet gangrene;had been following ortho as out pt;pt is on doxycycline  9/26 - 10/3  UNSPECIFIED ANEMIA Pt was noted to have HB 7.7-received 2 units blood in hospital with rec for out pt work up with GI and that has been ordered by me;pt to have CBC drawn today.  Acute on chronic renal failure Will watch here -BMP being drawn today;baseline Cr 1.4 on 8/27  DM (diabetes mellitus) type II controlled with renal manifestation Pt on insulin and Victoza; con cho diet and CBG ac and qHS  HYPOTHYROIDISM NOS Will continue replacement  HYPERTENSION Will continue lasix and diltiazem  Atrial fibrillation Continue diltiazem am\nd coumadin  HYPERLIPIDEMIA Continue Crestor and fenofibrate    Margit Hanks, MD

## 2012-11-30 NOTE — Assessment & Plan Note (Signed)
Continue diltiazem am\nd coumadin

## 2012-11-30 NOTE — Assessment & Plan Note (Signed)
Will watch here -BMP being drawn today;baseline Cr 1.4 on 8/27

## 2012-11-30 NOTE — Assessment & Plan Note (Signed)
Will continue lasix and diltiazem

## 2012-12-08 ENCOUNTER — Encounter: Payer: Self-pay | Admitting: Internal Medicine

## 2012-12-08 ENCOUNTER — Other Ambulatory Visit: Payer: Self-pay | Admitting: Family Medicine

## 2012-12-08 ENCOUNTER — Non-Acute Institutional Stay (SKILLED_NURSING_FACILITY): Payer: Medicare Other | Admitting: Internal Medicine

## 2012-12-08 DIAGNOSIS — N058 Unspecified nephritic syndrome with other morphologic changes: Secondary | ICD-10-CM

## 2012-12-08 DIAGNOSIS — E1129 Type 2 diabetes mellitus with other diabetic kidney complication: Secondary | ICD-10-CM

## 2012-12-09 ENCOUNTER — Encounter (HOSPITAL_COMMUNITY): Payer: Self-pay | Admitting: Emergency Medicine

## 2012-12-09 ENCOUNTER — Emergency Department (HOSPITAL_COMMUNITY)
Admission: EM | Admit: 2012-12-09 | Discharge: 2012-12-10 | Disposition: A | Discharge status: Home / Self Care | Payer: Medicare Other | Attending: Emergency Medicine | Admitting: Emergency Medicine

## 2012-12-09 ENCOUNTER — Emergency Department (HOSPITAL_COMMUNITY): Payer: Medicare Other

## 2012-12-09 ENCOUNTER — Encounter: Payer: Self-pay | Admitting: Internal Medicine

## 2012-12-09 DIAGNOSIS — Z7901 Long term (current) use of anticoagulants: Secondary | ICD-10-CM | POA: Insufficient documentation

## 2012-12-09 DIAGNOSIS — F3289 Other specified depressive episodes: Secondary | ICD-10-CM | POA: Insufficient documentation

## 2012-12-09 DIAGNOSIS — G40909 Epilepsy, unspecified, not intractable, without status epilepticus: Secondary | ICD-10-CM | POA: Insufficient documentation

## 2012-12-09 DIAGNOSIS — Z794 Long term (current) use of insulin: Secondary | ICD-10-CM | POA: Insufficient documentation

## 2012-12-09 DIAGNOSIS — I129 Hypertensive chronic kidney disease with stage 1 through stage 4 chronic kidney disease, or unspecified chronic kidney disease: Secondary | ICD-10-CM | POA: Insufficient documentation

## 2012-12-09 DIAGNOSIS — M109 Gout, unspecified: Secondary | ICD-10-CM | POA: Insufficient documentation

## 2012-12-09 DIAGNOSIS — Z872 Personal history of diseases of the skin and subcutaneous tissue: Secondary | ICD-10-CM | POA: Insufficient documentation

## 2012-12-09 DIAGNOSIS — N189 Chronic kidney disease, unspecified: Secondary | ICD-10-CM | POA: Insufficient documentation

## 2012-12-09 DIAGNOSIS — E785 Hyperlipidemia, unspecified: Secondary | ICD-10-CM | POA: Insufficient documentation

## 2012-12-09 DIAGNOSIS — E162 Hypoglycemia, unspecified: Secondary | ICD-10-CM | POA: Insufficient documentation

## 2012-12-09 DIAGNOSIS — Z8739 Personal history of other diseases of the musculoskeletal system and connective tissue: Secondary | ICD-10-CM | POA: Insufficient documentation

## 2012-12-09 DIAGNOSIS — I509 Heart failure, unspecified: Secondary | ICD-10-CM | POA: Insufficient documentation

## 2012-12-09 DIAGNOSIS — Z79899 Other long term (current) drug therapy: Secondary | ICD-10-CM | POA: Insufficient documentation

## 2012-12-09 DIAGNOSIS — Z87891 Personal history of nicotine dependence: Secondary | ICD-10-CM | POA: Insufficient documentation

## 2012-12-09 DIAGNOSIS — E119 Type 2 diabetes mellitus without complications: Secondary | ICD-10-CM | POA: Insufficient documentation

## 2012-12-09 DIAGNOSIS — F329 Major depressive disorder, single episode, unspecified: Secondary | ICD-10-CM | POA: Insufficient documentation

## 2012-12-09 LAB — GLUCOSE, CAPILLARY
Glucose-Capillary: 119 mg/dL — ABNORMAL HIGH (ref 70–99)
Glucose-Capillary: 45 mg/dL — ABNORMAL LOW (ref 70–99)
Glucose-Capillary: 44 mg/dL — CL (ref 70–99)
Glucose-Capillary: 87 mg/dL (ref 70–99)

## 2012-12-09 LAB — URINALYSIS, ROUTINE W REFLEX MICROSCOPIC
Bilirubin Urine: NEGATIVE
Glucose, UA: NEGATIVE mg/dL
Hgb urine dipstick: NEGATIVE
Ketones, ur: NEGATIVE mg/dL
Nitrite: NEGATIVE
Protein, ur: NEGATIVE mg/dL
Specific Gravity, Urine: 1.019 (ref 1.005–1.030)
Urobilinogen, UA: 1 mg/dL (ref 0.0–1.0)
pH: 5.5 (ref 5.0–8.0)

## 2012-12-09 LAB — URINE MICROSCOPIC-ADD ON

## 2012-12-09 LAB — CBC WITH DIFFERENTIAL/PLATELET
Basophils Absolute: 0 10*3/uL (ref 0.0–0.1)
Basophils Relative: 0 % (ref 0–1)
Eosinophils Absolute: 0.1 10*3/uL (ref 0.0–0.7)
Eosinophils Relative: 1 % (ref 0–5)
HCT: 30.8 % — ABNORMAL LOW (ref 39.0–52.0)
Hemoglobin: 10.2 g/dL — ABNORMAL LOW (ref 13.0–17.0)
Lymphocytes Relative: 9 % — ABNORMAL LOW (ref 12–46)
Lymphs Abs: 0.7 10*3/uL (ref 0.7–4.0)
MCH: 29.7 pg (ref 26.0–34.0)
MCHC: 33.1 g/dL (ref 30.0–36.0)
MCV: 89.8 fL (ref 78.0–100.0)
Monocytes Absolute: 0.5 10*3/uL (ref 0.1–1.0)
Monocytes Relative: 7 % (ref 3–12)
Neutro Abs: 6.5 10*3/uL (ref 1.7–7.7)
Neutrophils Relative %: 84 % — ABNORMAL HIGH (ref 43–77)
Platelets: 248 10*3/uL (ref 150–400)
RBC: 3.43 MIL/uL — ABNORMAL LOW (ref 4.22–5.81)
RDW: 18.4 % — ABNORMAL HIGH (ref 11.5–15.5)
WBC: 7.8 10*3/uL (ref 4.0–10.5)

## 2012-12-09 LAB — BASIC METABOLIC PANEL
BUN: 28 mg/dL — ABNORMAL HIGH (ref 6–23)
CO2: 25 mEq/L (ref 19–32)
Calcium: 8.7 mg/dL (ref 8.4–10.5)
Chloride: 96 mEq/L (ref 96–112)
Creatinine, Ser: 1.51 mg/dL — ABNORMAL HIGH (ref 0.50–1.35)
GFR calc Af Amer: 51 mL/min — ABNORMAL LOW (ref >90)
GFR calc non Af Amer: 44 mL/min — ABNORMAL LOW (ref >90)
Glucose, Bld: 112 mg/dL — ABNORMAL HIGH (ref 70–99)
Potassium: 3.6 mEq/L (ref 3.5–5.1)
Sodium: 130 mEq/L — ABNORMAL LOW (ref 135–145)

## 2012-12-09 MED ORDER — DEXTROSE 50 % IV SOLN: 25.0000 mL | Freq: Once | INTRAVENOUS | Status: AC

## 2012-12-09 MED ORDER — DEXTROSE 50 % IV SOLN
INTRAVENOUS | Status: AC
  Administered 2012-12-09: 50 mL
  Filled 2012-12-09: qty 50

## 2012-12-09 MED ORDER — SODIUM CHLORIDE 0.9 % IV BOLUS (SEPSIS)
500.0000 mL | INTRAVENOUS | Status: AC
  Administered 2012-12-09: 1000 mL via INTRAVENOUS

## 2012-12-09 MED ORDER — DEXTROSE 50 % IV SOLN
50.0000 mL | Freq: Once | INTRAVENOUS | Status: AC
  Administered 2012-12-09: 50 mL via INTRAVENOUS

## 2012-12-09 NOTE — ED Notes (Signed)
Pt reminded of need for urine 

## 2012-12-09 NOTE — ED Notes (Signed)
[facility] called  

## 2012-12-09 NOTE — ED Notes (Signed)
Pt reminded of need for urine specimen 

## 2012-12-09 NOTE — Progress Notes (Signed)
CSW attempted to meet with pt at bedside but nurses were in room with door closed assisting pt.  Pts son-in-law, Ranae Pila, was waiting the hallway outside of pts room.  Mr. Tiburcio Pea was able to confirm that pt is a current resident at [facility] and that the plan is for him to return once he is medically stable.  Mr.  Tiburcio Pea confirmed that his wife/pts. Daughter, Ardell Isaacs, is pts HCPOA.  Mr.  Tiburcio Pea reports that his wife was at pts bedside earlier but had to leave.  Her contact number is [phone].  Mr. Tiburcio Pea thanked CSW for concern.  Marva Panda, LCSWA  [phone] .12/09/2012 5:30 pm

## 2012-12-09 NOTE — ED Notes (Signed)
Bed: WA08 Expected date:  Expected time:  Means of arrival:  Comments: ems- hypoglycemic

## 2012-12-09 NOTE — Assessment & Plan Note (Addendum)
I have reviewed pt's CBG's over a 10 day period; they run evenly across the day approx 80-130 with a few outliers; pt is eating well;pt has no sign or sx of infection; will decrease Lantus by 5 units to 25 units BID and will decrease Humalog by 5 units to 20 units before meals-hold if CBG< 100; Will continue 4 times a day CBG's  Will draw HbA1c today; last one was on 8/27 was 11.3

## 2012-12-09 NOTE — ED Notes (Signed)
Bolus started for p89/36pt is aox4

## 2012-12-09 NOTE — Progress Notes (Signed)
Patient ID: [name], male   DOB: Nov 26, 1940, 72 y.o.   MRN: [id number] I was called to the patient's room after he had received glucagon x 1 for hypoglycemia.  His latest CBG had been 46 at that time.  He was in the midst of a generalized tonic clonic seizure.  I requested he be given another dose of glucagon, and 1mg  ativan IM.  By the time any of these became available (due to the system at the facility requiring a prescription to be faxed to the pharmacy before an emergency med can be given), pt had stopped seizing and he was able to speak to [place] and tell us his name.  EMS came over from another room from which they were transporting another resident.  They took over care and transported him to the hospital.  It seems clear that his seizure was hypoglycemia induced.  His vital signs remained wnl.  His glucose readings had just been reviewed by my colleague and new insulin orders written this am to reduce his lantus and humalog each by 5 units with holds for glucose <100.  Apparently, he had received his lunch insulin despite having skipped his lunch which should not typically occur.  If his labs are otherwise within normal limits and he has not further seizures, he could be sent back to SNF for further care.

## 2012-12-09 NOTE — ED Notes (Signed)
Pt given urinal and made aware of need for urine sample states can not void at this time

## 2012-12-09 NOTE — ED Notes (Addendum)
Pt remains alert and awake eating well wil continue to monitor eating [employer] peanutbutter

## 2012-12-09 NOTE — ED Notes (Signed)
1/2 amp d50 iv given for bl sugar 45

## 2012-12-09 NOTE — ED Notes (Addendum)
Per ems pt is from [facility]. Pt diabetic. Pt was given 25 units humalog at lunch with no lunch provided. Pt became hypoglycemic and had a seizure as witnessed by staff.. Initial CBG 43, given 2mg  glucagon. ems started IV gave 25mg  Dextrose 50. CBG then 129.  22 G L hand, getting a 250 NS bolus

## 2012-12-09 NOTE — ED Provider Notes (Signed)
CSN: [id number]     Arrival date & time 12/09/12  1439 History   First MD Initiated Contact with Patient 12/09/12 [phone]     Chief Complaint  Patient presents with  . Hypoglycemia  . Seizures   (Consider location/radiation/quality/duration/timing/severity/associated sxs/prior Treatment) HPI Patient presents to the emergency department with hypoglycemia.  Patient's been having this issue for the last 3 days.  The patient, states he feels, like the nursing home is giving him too much insulin.  Patient, states, that he has not had any fevers, nausea, vomiting, diarrhea, blurred vision, headache, chest pain, shortness of breath, abdominal pain, dizziness, or syncope.  Patient, states, that he has not been eating well since been given nursing home Past Medical History  Diagnosis Date  . Depression   . Hyperlipidemia   . Hypertension   . Chronic kidney disease   . CHF (congestive heart failure)   . Sleep apnea   . Gout   . Foot ulcer, left 06/04/2012    dorsum   . Type II diabetes mellitus   . History of blood transfusion     "counting this am I've had blood probably 3 times" (11/23/2012)  . Osteoarthritis     "legs sometimes" (11/23/2012)  . S/P BKA (below knee amputation) unilateral    Past Surgical History  Procedure Laterality Date  . Leg amputation below knee Right   . Orchiectomy Left     "I think it's the left" (11/23/2012)  . Shoulder open rotator cuff repair Left   . Inguinal hernia repair  1990's?    "? side" (11/23/2012)  . Cataract extraction w/ intraocular lens  implant, bilateral Bilateral ~2010  . I&d extremity Left 06/05/2012    Procedure: IRRIGATION AND DEBRIDEMENT LT ANKLE ;  Surgeon: Nadara Mustard, MD;  Location: [facility] OR;  Service: Orthopedics;  Laterality: Left;  . Application of wound vac Left 06/05/2012    Procedure: APPLICATION OF WOUND VAC;  Surgeon: Nadara Mustard, MD;  Location: [facility] OR;  Service: Orthopedics;  Laterality: Left;  . Apligraft placement Left 06/10/2012   Procedure: Left ankle skin graft;  Surgeon: Nadara Mustard, MD;  Location: [facility] OR;  Service: Orthopedics;  Laterality: Left;  . Application of wound vac Left 06/10/2012    Procedure: APPLICATION OF WOUND VAC;  Surgeon: Nadara Mustard, MD;  Location: [facility] OR;  Service: Orthopedics;  Laterality: Left;  . Amputation Left 11/24/2012    Procedure: AMPUTATION BELOW KNEE;  Surgeon: Nadara Mustard, MD;  Location: [facility] OR;  Service: Orthopedics;  Laterality: Left;  Left Below Knee Amputation   Family History  Problem Relation Age of Onset  . Diabetes Brother   . Heart disease Mother   . Kidney disease Mother    History  Substance Use Topics  . Smoking status: Former Smoker -- 1.50 packs/day for 45 years    Types: Cigarettes  . Smokeless tobacco: Never Used     Comment: 11/23/2012 "quit smoking 15-20 yr ago"  . Alcohol Use: No     Comment: 11/23/2012 "used to drink alot of whiskey; quit 15-20 yr ago"    Review of Systems All other systems negative except as documented in the HPI. All pertinent positives and negatives as reviewed in the HPI.  Allergies  Review of patient's allergies indicates no known allergies.  Home Medications   Current Outpatient Rx  Name  Route  Sig  Dispense  Refill  . allopurinol (ZYLOPRIM) 100 MG tablet   Oral   Take  100 mg by mouth daily.         . Ascorbic Acid (VITAMIN C) 1000 MG tablet   Oral   Take 1,000 mg by mouth daily.         Marland Kitchen diltiazem (DILACOR XR) 240 MG 24 hr capsule   Oral   Take 1 capsule (240 mg total) by mouth 2 (two) times daily.   180 capsule   3   . fenofibrate 160 MG tablet   Oral   Take 1 tablet (160 mg total) by mouth daily.   30 tablet   2     Please have the patient call the office   . furosemide (LASIX) 20 MG tablet      TAKE 1 TABLET BY MOUTH EVERY DAY   30 tablet   2   . gabapentin (NEURONTIN) 300 MG capsule   Oral   Take 1 capsule (300 mg total) by mouth 3 (three) times daily as needed (for nerve pain.).   270 capsule    3   . insulin glargine (LANTUS) 100 UNIT/ML injection   Subcutaneous   Inject 30 Units into the skin 2 (two) times daily.         . insulin lispro (HUMALOG) 100 UNIT/ML injection   Subcutaneous   Inject 25 Units into the skin 3 (three) times daily before meals.         Marland Kitchen levothyroxine (SYNTHROID, LEVOTHROID) 200 MCG tablet   Oral   Take 1 tablet (200 mcg total) by mouth daily before breakfast.   90 tablet   3   . Liraglutide (VICTOZA) 18 MG/3ML SOPN   Subcutaneous   Inject 1.2 mg into the skin daily.         . Multiple Vitamin (MULTIVITAMIN WITH MINERALS) TABS   Oral   Take 1 tablet by mouth daily.         Marland Kitchen nystatin (MYCOSTATIN) powder   Topical   Apply 1 g topically See admin instructions. Apply 2-3 times daily         . oxyCODONE-acetaminophen (PERCOCET/ROXICET) 5-325 MG per tablet   Oral   Take 1-2 tablets by mouth every 6 (six) hours as needed.   20 tablet   0   . potassium chloride (K-DUR) 10 MEQ tablet   Oral   Take 10 mEq by mouth daily.         Marland Kitchen rOPINIRole (REQUIP) 2 MG tablet   Oral   Take 2 mg by mouth at bedtime.         . rosuvastatin (CRESTOR) 20 MG tablet   Oral   Take 20 mg by mouth daily.         . silver sulfADIAZINE (SILVADENE) 1 % cream   Topical   Apply 1 application topically 2 (two) times daily.         . tamsulosin (FLOMAX) 0.4 MG CAPS capsule   Oral   Take 1 capsule (0.4 mg total) by mouth daily.   30 capsule   2   . warfarin (COUMADIN) 5 MG tablet      Take 2.5 mg today 11/27/12 and tomorrow, then 5mg  PO q daily thereafter. Follow up on PT/INR check 11/28/12   30 tablet   0    BP 99/55  Pulse 89  Temp(Src) 98.1 F (36.7 C) (Oral)  Resp 16  SpO2 93% Physical Exam  Nursing note and vitals reviewed. Constitutional: He is oriented to person, place, and time. He appears well-developed and well-nourished.  No distress.  HENT:  Head: Normocephalic and atraumatic.  Mouth/Throat: Oropharynx is clear and moist.   Eyes: Conjunctivae and EOM are normal. Pupils are equal, round, and reactive to light.  Neck: Normal range of motion. Neck supple.  Cardiovascular: Normal rate, regular rhythm and normal heart sounds.  Exam reveals no gallop and no friction rub.   No murmur heard. Pulmonary/Chest: Effort normal and breath sounds normal.  Neurological: He is alert and oriented to person, place, and time. He exhibits normal muscle tone. Coordination normal.  Skin: Skin is warm and dry. No erythema.    ED Course  Procedures (including critical care time) Labs Review Labs Reviewed  GLUCOSE, CAPILLARY - Abnormal; Notable for the following:    Glucose-Capillary 119 (*)    All other components within normal limits  CBC WITH DIFFERENTIAL  BASIC METABOLIC PANEL  URINALYSIS, ROUTINE W REFLEX MICROSCOPIC   MDM  The patient and the family are explained that following such a significant surgery not eating.  His insulin dose is most likely need to be adjusted.  The patient has been stable here in the emergency department.  Other than early on in the visit.  He did have a low blood sugars, but was still mentating appropriately.  Advised the family to speak with his primary care Dr. to get in touch with the facility doctor as well.  I advised the patient and the family to return here for any worsening in his condition    Carlyle Dolly, PA-C 12/11/12 0129

## 2012-12-09 NOTE — Discharge Instructions (Signed)
I would hold his insulin dose for tonight.  Check his blood sugars every 4 hours.

## 2012-12-09 NOTE — ED Notes (Signed)
Pt talking to MD about being admited

## 2012-12-09 NOTE — Progress Notes (Signed)
MRN: [id number] Name: [name]  Sex: male Age: 72 y.o. DOB: [birth year]  PSC #  [facility] Facility/Room: 125B Level Of Care: SNF Provider: Merrilee Seashore D Emergency Contacts: Extended Emergency Contact Information Primary Emergency Contact: Harris,Pat  [place] Mobile Phone: [phone] Relation: Daughter  Code Status:   Allergies: Review of patient's allergies indicates no known allergies.  Chief Complaint  Patient presents with  . Acute Visit    HPI: Patient is 72 y.o. male who the nurses have asked me to see because he has been having severe hypoglycemic episodes in the morning.  Past Medical History  Diagnosis Date  . Depression   . Hyperlipidemia   . Hypertension   . Chronic kidney disease   . CHF (congestive heart failure)   . Sleep apnea   . Gout   . Foot ulcer, left 06/04/2012    dorsum   . Type II diabetes mellitus   . History of blood transfusion     "counting this am I've had blood probably 3 times" (11/23/2012)  . Osteoarthritis     "legs sometimes" (11/23/2012)  . S/P BKA (below knee amputation) unilateral     Past Surgical History  Procedure Laterality Date  . Leg amputation below knee Right   . Orchiectomy Left     "I think it's the left" (11/23/2012)  . Shoulder open rotator cuff repair Left   . Inguinal hernia repair  1990's?    "? side" (11/23/2012)  . Cataract extraction w/ intraocular lens  implant, bilateral Bilateral ~2010  . I&d extremity Left 06/05/2012    Procedure: IRRIGATION AND DEBRIDEMENT LT ANKLE ;  Surgeon: Nadara Mustard, MD;  Location: [facility] OR;  Service: Orthopedics;  Laterality: Left;  . Application of wound vac Left 06/05/2012    Procedure: APPLICATION OF WOUND VAC;  Surgeon: Nadara Mustard, MD;  Location: [facility] OR;  Service: Orthopedics;  Laterality: Left;  . Apligraft placement Left 06/10/2012    Procedure: Left ankle skin graft;  Surgeon: Nadara Mustard, MD;  Location: [facility];  Service: Orthopedics;  Laterality:  Left;  . Application of wound vac Left 06/10/2012    Procedure: APPLICATION OF WOUND VAC;  Surgeon: Nadara Mustard, MD;  Location: [facility];  Service: Orthopedics;  Laterality: Left;  . Amputation Left 11/24/2012    Procedure: AMPUTATION BELOW KNEE;  Surgeon: Nadara Mustard, MD;  Location: [facility] OR;  Service: Orthopedics;  Laterality: Left;  Left Below Knee Amputation      Medication List       This list is accurate as of: 12/08/12 11:59 PM.  Always use your most recent med list.               allopurinol 100 MG tablet  Commonly known as:  ZYLOPRIM  Take 100 mg by mouth daily.     diltiazem 240 MG 24 hr capsule  Commonly known as:  DILACOR XR  Take 1 capsule (240 mg total) by mouth 2 (two) times daily.     fenofibrate 160 MG tablet  Take 1 tablet (160 mg total) by mouth daily.     furosemide 20 MG tablet  Commonly known as:  LASIX  TAKE 1 TABLET BY MOUTH EVERY DAY     gabapentin 300 MG capsule  Commonly known as:  NEURONTIN  Take 1 capsule (300 mg total) by mouth 3 (three) times daily as needed (for nerve pain.).     insulin glargine 100 UNIT/ML injection  Commonly  known as:  LANTUS  Inject 30 Units into the skin 2 (two) times daily.     insulin lispro 100 UNIT/ML injection  Commonly known as:  HUMALOG  Inject 25 Units into the skin 3 (three) times daily before meals.     levothyroxine 200 MCG tablet  Commonly known as:  SYNTHROID, LEVOTHROID  Take 1 tablet (200 mcg total) by mouth daily before breakfast.     multivitamin with minerals Tabs tablet  Take 1 tablet by mouth daily.     nystatin powder  Commonly known as:  MYCOSTATIN  Apply 1 g topically See admin instructions. Apply 2-3 times daily     oxyCODONE-acetaminophen 5-325 MG per tablet  Commonly known as:  PERCOCET/ROXICET  Take 1-2 tablets by mouth every 6 (six) hours as needed.     potassium chloride 10 MEQ tablet  Commonly known as:  K-DUR  Take 10 mEq by mouth daily.     rOPINIRole 2 MG tablet  Commonly  known as:  REQUIP  Take 2 mg by mouth at bedtime.     rosuvastatin 20 MG tablet  Commonly known as:  CRESTOR  Take 20 mg by mouth daily.     silver sulfADIAZINE 1 % cream  Commonly known as:  SILVADENE  Apply 1 application topically 2 (two) times daily.     tamsulosin 0.4 MG Caps capsule  Commonly known as:  FLOMAX  Take 1 capsule (0.4 mg total) by mouth daily.     VICTOZA 18 MG/3ML Sopn  Generic drug:  Liraglutide  Inject 1.2 mg into the skin daily.     vitamin C 1000 MG tablet  Take 1,000 mg by mouth daily.     warfarin 5 MG tablet  Commonly known as:  COUMADIN  Take 2.5 mg today 11/27/12 and tomorrow, then 5mg  PO q daily thereafter. Follow up on PT/INR check 11/28/12        No orders of the defined types were placed in this encounter.    Immunization History  Administered Date(s) Administered  . Influenza Split 12/19/2010  . Influenza Whole 11/16/2008  . Influenza,inj,Quad PF,36+ Mos 11/25/2012  . Pneumococcal Polysaccharide 01/18/2009  . Td 01/11/2009  . Zoster 09/03/2006    History  Substance Use Topics  . Smoking status: Former Smoker -- 1.50 packs/day for 45 years    Types: Cigarettes  . Smokeless tobacco: Never Used     Comment: 11/23/2012 "quit smoking 15-20 yr ago"  . Alcohol Use: No     Comment: 11/23/2012 "used to drink alot of whiskey; quit 15-20 yr ago"    Review of Systems  DATA OBTAINED: from patient GENERAL: Feels well no fevers, fatigue, appetite changes SKIN: No itching, rash HEENT: No complaint RESPIRATORY: No cough, wheezing, SOB CARDIAC: No chest pain, palpitations, lower extremity edema  GI: No abdominal pain, No N/V/D or constipation, No heartburn or reflux  GU: No dysuria, frequency or urgency, or incontinence  MUSCULOSKELETAL: No unrelieved bone/joint pain NEUROLOGIC: No headache, dizziness or focal weakness PSYCHIATRIC: No overt anxiety or sadness. Sleeps well.   Filed Vitals:   12/08/12 1529  BP: 126/86  Pulse: 102  Resp:  24    Physical Exam  GENERAL APPEARANCE: Alert, conversant. Appropriately groomed. No acute distress ; DENIES COLDS, COUGHS, DYSURIA, FEVER, ANYTHING THAT COULD BE RELATED TO INFECTION SKIN: No diaphoresis rash HEENT: Unremarkable RESPIRATORY: Breathing is even, unlabored. Lung sounds are clear   CARDIOVASCULAR: Heart RRR no murmurs, rubs or gallops. No peripheral edema  GASTROINTESTINAL:  Abdomen is soft, non-tender, not distended w/ normal bowel sounds.  GENITOURINARY: Bladder non tender, not distended  MUSCULOSKELETAL:bilateral BKA NEUROLOGIC: Cranial nerves 2-12 grossly intact. Moves all extremities no tremor. PSYCHIATRIC: Mood and affect appropriate to situation, no behavioral issues  Patient Active Problem List   Diagnosis Date Noted  . S/P BKA (below knee amputation) unilateral   . Diabetic wet gangrene of the foot 11/22/2012  . Diabetic osteomyelitis 06/04/2012  . Hyponatremia 06/04/2012  . Hypokalemia 06/04/2012  . Acute on chronic renal failure 06/04/2012  . Atrial fibrillation 08/31/2010  . Cardiac arrest 08/31/2010  . Cellulitis 08/31/2010  . Acute osteomyelitis, ankle and foot 02/06/2010  . UNSPECIFIED ANEMIA 01/11/2010  . CLOSTRIDIUM DIFFICILE COLITIS, HX OF 01/11/2010  . DECUBITUS ULCER, BUTTOCK 11/02/2009  . ANEMIA, PERNICIOUS 10/05/2009  . CONJUNCTIVITIS, BACTERIAL 08/08/2009  . SLEEP APNEA 02/06/2009  . BKA, RIGHT LEG 01/05/2009  . OSTEOARTHRITIS 06/18/2007  . HYPOTHYROIDISM NOS 09/03/2006  . DM (diabetes mellitus) type II controlled with renal manifestation 06/11/2006  . B12 DEFICIENCY 06/11/2006  . HYPERLIPIDEMIA 06/11/2006  . GOUT 06/11/2006  . DEPRESSION 06/11/2006  . HYPERTENSION 06/11/2006  . RENAL INSUFFICIENCY 06/11/2006  . RESTLESS LEG SYNDROME, HX OF 06/11/2006    CBC    Component Value Date/Time   WBC 5.3 11/27/2012 0500   RBC 2.84* 11/27/2012 0500   RBC 2.70* 12/30/2009 0930   HGB 8.1* 11/27/2012 0500   HCT 26.2* 11/27/2012 0500   PLT  201 11/27/2012 0500   MCV 92.3 11/27/2012 0500   LYMPHSABS 0.7 11/22/2012 2026   MONOABS 0.7 11/22/2012 2026   EOSABS 0.0 11/22/2012 2026   BASOSABS 0.0 11/22/2012 2026    CMP     Component Value Date/Time   NA 137 11/25/2012 0615   K 3.7 11/25/2012 0615   CL 102 11/25/2012 0615   CO2 28 11/25/2012 0615   GLUCOSE 158* 11/25/2012 0615   GLUCOSE 135* 12/17/2005 1011   BUN 32* 11/25/2012 0615   CREATININE 1.37* 11/25/2012 0615   CALCIUM 8.4 11/25/2012 0615   PROT 6.6 11/23/2012 0210   ALBUMIN 2.4* 11/23/2012 0210   AST 52* 11/23/2012 0210   ALT 17 11/23/2012 0210   ALKPHOS 32* 11/23/2012 0210   BILITOT 0.7 11/23/2012 0210   GFRNONAA 50* 11/25/2012 0615   GFRAA 57* 11/25/2012 0615    Assessment and Plan  DM (diabetes mellitus) type II controlled with renal manifestation I have reviewed pt's CBG's over a 10 day period; they run evenly across the day approx 80-130 with a few outliers; pt is eating well;pt has no sign or sx of infection; will decrease Lantus by 5 units to 25 units BID and will decrease Humalog by 5 units to 20 units before meals-hold if CBG< 100; Will continue 4 times a day CBG's  Will draw HbA1c today; last one was on 8/27 was 11.3   35 minutes was spent studying CBG, examine pt, coming up with a plan   Margit Hanks, MD

## 2012-12-11 ENCOUNTER — Other Ambulatory Visit: Payer: Self-pay | Admitting: Family Medicine

## 2012-12-11 ENCOUNTER — Other Ambulatory Visit: Payer: Self-pay | Admitting: *Deleted

## 2012-12-11 MED ORDER — HYDROCODONE-ACETAMINOPHEN 7.5-300 MG PO TABS: ORAL_TABLET | ORAL | Status: DC

## 2012-12-11 NOTE — ED Provider Notes (Signed)
Medical screening examination/treatment/procedure(s) were conducted as a shared visit with non-physician practitioner(s) and myself.  I personally evaluated the patient during the encounter Pt presents w/ low blood glucose from SNF. Pt states he has been getting a lot of insulin ingections, but not eating much.  Here, awake, alert, appropriate, non-toxic.  Pt eaten in department with improvement of FSBG, and feel he is safe to continued to be monitored in SNF  Shanna Cisco, MD 12/11/12 1544

## 2012-12-15 ENCOUNTER — Encounter: Payer: Self-pay | Admitting: Internal Medicine

## 2012-12-15 ENCOUNTER — Non-Acute Institutional Stay (SKILLED_NURSING_FACILITY): Payer: Medicare Other | Admitting: Internal Medicine

## 2012-12-15 DIAGNOSIS — E1129 Type 2 diabetes mellitus with other diabetic kidney complication: Secondary | ICD-10-CM

## 2012-12-15 DIAGNOSIS — N058 Unspecified nephritic syndrome with other morphologic changes: Secondary | ICD-10-CM

## 2012-12-15 NOTE — Assessment & Plan Note (Addendum)
I have evaluated days of CBGs and pt's BS have been running from 80-150 since his insulin was lowered,yet he has had more hypoglycemic episodes,  one requiring a visit to ED. This time I will make some more drastic cuts as follows: Lantus 30 units qHS  (down from 25 units BID) and Humalog 10 units before meals (instead of 20 units). I expect to see much higher BS, but we can slowly add more insulin. I discussed this with the patient.

## 2012-12-15 NOTE — Progress Notes (Signed)
MRN: [id number] Name: [name]  Sex: male Age: 72 y.o. DOB: [birth year]  PSC #: Ronni Rumble Facility/Room: 125B Level Of Care: SNF Provider: Merrilee Seashore D Emergency Contacts: Extended Emergency Contact Information Primary Emergency Contact: Harris,Pat  [place] Mobile Phone: [phone] Relation: Daughter  Code Status: FULL  Allergies: Review of patient's allergies indicates no known allergies.  Chief Complaint  Patient presents with  . Acute Visit    HPI: Patient is 72 y.o. male who his nurse has asked me to see because he keeps having low BS.   Past Medical History  Diagnosis Date  . Depression   . Hyperlipidemia   . Hypertension   . Chronic kidney disease   . CHF (congestive heart failure)   . Sleep apnea   . Gout   . Foot ulcer, left 06/04/2012    dorsum   . Type II diabetes mellitus   . History of blood transfusion     "counting this am I've had blood probably 3 times" (11/23/2012)  . Osteoarthritis     "legs sometimes" (11/23/2012)  . S/P BKA (below knee amputation) unilateral     Past Surgical History  Procedure Laterality Date  . Leg amputation below knee Right   . Orchiectomy Left     "I think it's the left" (11/23/2012)  . Shoulder open rotator cuff repair Left   . Inguinal hernia repair  1990's?    "? side" (11/23/2012)  . Cataract extraction w/ intraocular lens  implant, bilateral Bilateral ~2010  . I&d extremity Left 06/05/2012    Procedure: IRRIGATION AND DEBRIDEMENT LT ANKLE ;  Surgeon: Nadara Mustard, MD;  Location: [facility];  Service: Orthopedics;  Laterality: Left;  . Application of wound vac Left 06/05/2012    Procedure: APPLICATION OF WOUND VAC;  Surgeon: Nadara Mustard, MD;  Location: [facility];  Service: Orthopedics;  Laterality: Left;  . Apligraft placement Left 06/10/2012    Procedure: Left ankle skin graft;  Surgeon: Nadara Mustard, MD;  Location: [facility];  Service: Orthopedics;  Laterality: Left;  . Application of wound vac Left  06/10/2012    Procedure: APPLICATION OF WOUND VAC;  Surgeon: Nadara Mustard, MD;  Location: [facility];  Service: Orthopedics;  Laterality: Left;  . Amputation Left 11/24/2012    Procedure: AMPUTATION BELOW KNEE;  Surgeon: Nadara Mustard, MD;  Location: [facility];  Service: Orthopedics;  Laterality: Left;  Left Below Knee Amputation      Medication List       This list is accurate as of: 12/15/12  4:24 PM.  Always use your most recent med list.               allopurinol 100 MG tablet  Commonly known as:  ZYLOPRIM  Take 100 mg by mouth daily.     diltiazem 240 MG 24 hr capsule  Commonly known as:  DILACOR XR  Take 1 capsule (240 mg total) by mouth 2 (two) times daily.     fenofibrate 160 MG tablet  Take 1 tablet (160 mg total) by mouth daily.     furosemide 20 MG tablet  Commonly known as:  LASIX  TAKE 1 TABLET BY MOUTH EVERY DAY     gabapentin 300 MG capsule  Commonly known as:  NEURONTIN  Take 1 capsule (300 mg total) by mouth 3 (three) times daily as needed (for nerve pain.).     Hydrocodone-Acetaminophen 7.5-300 MG Tabs  Take one tablet by mouth every  6 hours as needed for pain     insulin glargine 100 UNIT/ML injection  Commonly known as:  LANTUS  Inject 25 Units into the skin 2 (two) times daily.     insulin lispro 100 UNIT/ML injection  Commonly known as:  HUMALOG  Inject 20 Units into the skin 3 (three) times daily before meals.     KLOR-CON 10 10 MEQ tablet  Generic drug:  potassium chloride  TAKE 1 TABLET BY MOUTH EVERY DAY     levothyroxine 200 MCG tablet  Commonly known as:  SYNTHROID, LEVOTHROID  Take 1 tablet (200 mcg total) by mouth daily before breakfast.     multivitamin with minerals Tabs tablet  Take 1 tablet by mouth daily.     oxyCODONE-acetaminophen 5-325 MG per tablet  Commonly known as:  PERCOCET/ROXICET  Take 1-2 tablets by mouth every 6 (six) hours as needed.     rOPINIRole 2 MG tablet  Commonly known as:  REQUIP  Take 2 mg by mouth at  bedtime.     rosuvastatin 20 MG tablet  Commonly known as:  CRESTOR  Take 20 mg by mouth daily.     sulfamethoxazole-trimethoprim 800-160 MG per tablet  Commonly known as:  BACTRIM DS  Take 1 tablet by mouth 2 (two) times daily. 10 days starting 11/30/12     tamsulosin 0.4 MG Caps capsule  Commonly known as:  FLOMAX  Take 1 capsule (0.4 mg total) by mouth daily.     VICTOZA 18 MG/3ML Sopn  Generic drug:  Liraglutide  Inject 1.2 mg into the skin daily.     vitamin C 1000 MG tablet  Take 1,000 mg by mouth daily.     warfarin 5 MG tablet  Commonly known as:  COUMADIN  Take 2.5 mg today 11/27/12 and tomorrow, then 5mg  PO q daily thereafter. Follow up on PT/INR check 11/28/12     zinc sulfate 220 MG capsule  Take 220 mg by mouth daily. For wound healing for 14 days. Started 12/04/12        No orders of the defined types were placed in this encounter.    Immunization History  Administered Date(s) Administered  . Influenza Split 12/19/2010  . Influenza Whole 11/16/2008  . Influenza,inj,Quad PF,36+ Mos 11/25/2012  . Pneumococcal Polysaccharide 01/18/2009  . Td 01/11/2009  . Zoster 09/03/2006    History  Substance Use Topics  . Smoking status: Former Smoker -- 1.50 packs/day for 45 years    Types: Cigarettes  . Smokeless tobacco: Never Used     Comment: 11/23/2012 "quit smoking 15-20 yr ago"  . Alcohol Use: No     Comment: 11/23/2012 "used to drink alot of whiskey; quit 15-20 yr ago"    Review of Systems  DATA OBTAINED: from patient, nurse; PT HAS NO C/O EXCEPT HE WANTS STAPLES REMOVED FROM OP SITE OF LBKA GENERAL: Feels well no fevers, fatigue, appetite changes SKIN: No itching, rash HEENT: No complaint RESPIRATORY: No cough, wheezing, SOB CARDIAC: No chest pain, palpitations, lower extremity edema  GI: No abdominal pain, No N/V/D or constipation, No heartburn or reflux  GU: No dysuria, frequency or urgency, or incontinence  MUSCULOSKELETAL: No unrelieved bone/joint  pain NEUROLOGIC: No headache, dizziness or focal weakness PSYCHIATRIC: No overt anxiety or sadness. Sleeps well.   Filed Vitals:   12/15/12 1554  BP: 153/64  Pulse: 60  Resp: 21    Physical Exam  GENERAL APPEARANCE: Alert, conversant. Appropriately groomed. No acute distress ;obese SKIN: No diaphoresis  rash,;L stump is wrapped;nurse reports no problems with healing HEENT: Unremarkable RESPIRATORY: Breathing is even, unlabored. Lung sounds are clear   CARDIOVASCULAR: Heart RRR no murmurs, rubs or gallops. No peripheral edema  GASTROINTESTINAL: Abdomen is soft, non-tender, not distended w/ normal bowel sounds.  GENITOURINARY: Bladder non tender, not distended  MUSCULOSKELETAL: Bilateral BKA NEUROLOGIC: Cranial nerves 2-12 grossly intact. Moves all extremities no tremor. PSYCHIATRIC: Mood and affect appropriate to situation, no behavioral issues  Patient Active Problem List   Diagnosis Date Noted  . S/P BKA (below knee amputation) unilateral   . Diabetic wet gangrene of the foot 11/22/2012  . Diabetic osteomyelitis 06/04/2012  . Hyponatremia 06/04/2012  . Hypokalemia 06/04/2012  . Acute on chronic renal failure 06/04/2012  . Atrial fibrillation 08/31/2010  . Cardiac arrest 08/31/2010  . Cellulitis 08/31/2010  . Acute osteomyelitis, ankle and foot 02/06/2010  . UNSPECIFIED ANEMIA 01/11/2010  . CLOSTRIDIUM DIFFICILE COLITIS, HX OF 01/11/2010  . DECUBITUS ULCER, BUTTOCK 11/02/2009  . ANEMIA, PERNICIOUS 10/05/2009  . CONJUNCTIVITIS, BACTERIAL 08/08/2009  . SLEEP APNEA 02/06/2009  . BKA, RIGHT LEG 01/05/2009  . OSTEOARTHRITIS 06/18/2007  . HYPOTHYROIDISM NOS 09/03/2006  . DM (diabetes mellitus) type II controlled with renal manifestation 06/11/2006  . B12 DEFICIENCY 06/11/2006  . HYPERLIPIDEMIA 06/11/2006  . GOUT 06/11/2006  . DEPRESSION 06/11/2006  . HYPERTENSION 06/11/2006  . RENAL INSUFFICIENCY 06/11/2006  . RESTLESS LEG SYNDROME, HX OF 06/11/2006    CBC     Component Value Date/Time   WBC 7.8 12/09/2012 1525   RBC 3.43* 12/09/2012 1525   RBC 2.70* 12/30/2009 0930   HGB 10.2* 12/09/2012 1525   HCT 30.8* 12/09/2012 1525   PLT 248 12/09/2012 1525   MCV 89.8 12/09/2012 1525   LYMPHSABS 0.7 12/09/2012 1525   MONOABS 0.5 12/09/2012 1525   EOSABS 0.1 12/09/2012 1525   BASOSABS 0.0 12/09/2012 1525    CMP     Component Value Date/Time   NA 130* 12/09/2012 1525   K 3.6 12/09/2012 1525   CL 96 12/09/2012 1525   CO2 25 12/09/2012 1525   GLUCOSE 112* 12/09/2012 1525   GLUCOSE 135* 12/17/2005 1011   BUN 28* 12/09/2012 1525   CREATININE 1.51* 12/09/2012 1525   CALCIUM 8.7 12/09/2012 1525   PROT 6.6 11/23/2012 0210   ALBUMIN 2.4* 11/23/2012 0210   AST 52* 11/23/2012 0210   ALT 17 11/23/2012 0210   ALKPHOS 32* 11/23/2012 0210   BILITOT 0.7 11/23/2012 0210   GFRNONAA 44* 12/09/2012 1525   GFRAA 51* 12/09/2012 1525    Assessment and Plan  DM (diabetes mellitus) type II controlled with renal manifestation I have evaluated days of CBGs and pt's BS have been running from 80-150 since his insulin was lowered,yet he has had more hypoglycemic episodes,  one requiring a visit to ED. This time I will make some more drastic cuts as follows: Lantus 30 units qHS  (down from 25 units BID) and Humalog 10 units before meals (instead of 20 units). I expect to see much higher BS, but we can slowly add more insulin. I discussed this with the patient.    Margit Hanks, MD

## 2012-12-24 ENCOUNTER — Ambulatory Visit: Payer: Medicare Other | Admitting: Gastroenterology

## 2012-12-28 ENCOUNTER — Other Ambulatory Visit: Payer: Self-pay | Admitting: Family Medicine

## 2012-12-29 ENCOUNTER — Other Ambulatory Visit: Payer: Self-pay | Admitting: Family Medicine

## 2013-01-02 ENCOUNTER — Other Ambulatory Visit: Payer: Self-pay | Admitting: Family Medicine

## 2013-01-25 ENCOUNTER — Other Ambulatory Visit: Payer: Self-pay | Admitting: Family Medicine

## 2013-01-26 ENCOUNTER — Encounter: Payer: Self-pay | Admitting: Internal Medicine

## 2013-01-26 ENCOUNTER — Non-Acute Institutional Stay (SKILLED_NURSING_FACILITY): Payer: Medicare Other | Admitting: Internal Medicine

## 2013-01-26 DIAGNOSIS — E1129 Type 2 diabetes mellitus with other diabetic kidney complication: Secondary | ICD-10-CM

## 2013-01-26 DIAGNOSIS — E785 Hyperlipidemia, unspecified: Secondary | ICD-10-CM

## 2013-01-26 DIAGNOSIS — M109 Gout, unspecified: Secondary | ICD-10-CM

## 2013-01-26 DIAGNOSIS — Z89512 Acquired absence of left leg below knee: Secondary | ICD-10-CM

## 2013-01-26 DIAGNOSIS — G4730 Sleep apnea, unspecified: Secondary | ICD-10-CM

## 2013-01-26 DIAGNOSIS — S88119A Complete traumatic amputation at level between knee and ankle, unspecified lower leg, initial encounter: Secondary | ICD-10-CM

## 2013-01-26 DIAGNOSIS — N058 Unspecified nephritic syndrome with other morphologic changes: Secondary | ICD-10-CM

## 2013-01-26 NOTE — Progress Notes (Signed)
MRN: [id number] Name: [name]  Sex: male Age: 72 y.o. DOB: [birth year]  PSC #: Ronni Rumble Facility/Room: 125B Level Of Care: SNF Provider: Merrilee Seashore D Emergency Contacts: Extended Emergency Contact Information Primary Emergency Contact: Harris,Pat  [place] Mobile Phone: [phone] Relation: Daughter     Allergies: Review of patient's allergies indicates no known allergies.  Chief Complaint  Patient presents with  . Discharge Note    HPI: Patient is 72 y.o. male who is being discharged to home.  Past Medical History  Diagnosis Date  . Depression   . Hyperlipidemia   . Hypertension   . Chronic kidney disease   . CHF (congestive heart failure)   . Sleep apnea   . Gout   . Foot ulcer, left 06/04/2012    dorsum   . Type II diabetes mellitus   . History of blood transfusion     "counting this am I've had blood probably 3 times" (11/23/2012)  . Osteoarthritis     "legs sometimes" (11/23/2012)  . S/P BKA (below knee amputation) unilateral     Past Surgical History  Procedure Laterality Date  . Leg amputation below knee Right   . Orchiectomy Left     "I think it's the left" (11/23/2012)  . Shoulder open rotator cuff repair Left   . Inguinal hernia repair  1990's?    "? side" (11/23/2012)  . Cataract extraction w/ intraocular lens  implant, bilateral Bilateral ~2010  . I&d extremity Left 06/05/2012    Procedure: IRRIGATION AND DEBRIDEMENT LT ANKLE ;  Surgeon: Nadara Mustard, MD;  Location: [facility];  Service: Orthopedics;  Laterality: Left;  . Application of wound vac Left 06/05/2012    Procedure: APPLICATION OF WOUND VAC;  Surgeon: Nadara Mustard, MD;  Location: [facility];  Service: Orthopedics;  Laterality: Left;  . Apligraft placement Left 06/10/2012    Procedure: Left ankle skin graft;  Surgeon: Nadara Mustard, MD;  Location: [facility];  Service: Orthopedics;  Laterality: Left;  . Application of wound vac Left 06/10/2012    Procedure: APPLICATION OF WOUND  VAC;  Surgeon: Nadara Mustard, MD;  Location: [facility];  Service: Orthopedics;  Laterality: Left;  . Amputation Left 11/24/2012    Procedure: AMPUTATION BELOW KNEE;  Surgeon: Nadara Mustard, MD;  Location: [facility];  Service: Orthopedics;  Laterality: Left;  Left Below Knee Amputation      Medication List       This list is accurate as of: 01/26/13 11:59 PM.  Always use your most recent med list.               allopurinol 100 MG tablet  Commonly known as:  ZYLOPRIM  Take 100 mg by mouth daily.     atorvastatin 40 MG tablet  Commonly known as:  LIPITOR  Take 40 mg by mouth daily.     diltiazem 240 MG 24 hr capsule  Commonly known as:  DILACOR XR  Take 1 capsule (240 mg total) by mouth 2 (two) times daily.     fenofibrate 160 MG tablet  TAKE 1 TABLET BY MOUTH EVERY DAY     furosemide 20 MG tablet  Commonly known as:  LASIX  TAKE 1 TABLET BY MOUTH EVERY DAY     gabapentin 300 MG capsule  Commonly known as:  NEURONTIN  TAKE ONE CAPSULE BY MOUTH EVERY 8 HOURS AS NEEDED     insulin glargine 100 UNIT/ML injection  Commonly known as:  LANTUS  Inject 30 Units into the skin at bedtime.     insulin lispro 100 UNIT/ML injection  Commonly known as:  HUMALOG  Inject 10 Units into the skin 3 (three) times daily before meals.     KLOR-CON 10 10 MEQ tablet  Generic drug:  potassium chloride  TAKE 1 TABLET BY MOUTH EVERY DAY     levothyroxine 200 MCG tablet  Commonly known as:  SYNTHROID, LEVOTHROID  Take 1 tablet (200 mcg total) by mouth daily before breakfast.     multivitamin with minerals Tabs tablet  Take 1 tablet by mouth daily.     oxyCODONE-acetaminophen 5-325 MG per tablet  Commonly known as:  PERCOCET/ROXICET  Take 1-2 tablets by mouth every 6 (six) hours as needed.     polymixin-bacitracin 500-10000 UNIT/GM Oint ointment  Apply 1 application topically 2 (two) times daily.     rOPINIRole 2 MG tablet  Commonly known as:  REQUIP  Take 2 mg by mouth at bedtime.      silver sulfADIAZINE 1 % cream  Commonly known as:  SILVADENE  Apply 1 application topically 2 (two) times daily. Apply to groin, sacrum, coccyx     tamsulosin 0.4 MG Caps capsule  Commonly known as:  FLOMAX  Take 1 capsule (0.4 mg total) by mouth daily.     VICTOZA 18 MG/3ML Sopn  Generic drug:  Liraglutide  Inject 1.2 mg into the skin daily.     vitamin C 1000 MG tablet  Take 1,000 mg by mouth daily.     warfarin 6 MG tablet  Commonly known as:  COUMADIN  Take 6 mg by mouth daily.        Meds ordered this encounter  Medications  . warfarin (COUMADIN) 6 MG tablet    Sig: Take 6 mg by mouth daily.  Marland Kitchen atorvastatin (LIPITOR) 40 MG tablet    Sig: Take 40 mg by mouth daily.  . silver sulfADIAZINE (SILVADENE) 1 % cream    Sig: Apply 1 application topically 2 (two) times daily. Apply to groin, sacrum, coccyx  . polymixin-bacitracin (POLYSPORIN) 500-10000 UNIT/GM OINT ointment    Sig: Apply 1 application topically 2 (two) times daily.    Immunization History  Administered Date(s) Administered  . Influenza Split 12/19/2010  . Influenza Whole 11/16/2008  . Influenza,inj,Quad PF,36+ Mos 11/25/2012  . Pneumococcal Polysaccharide-23 01/18/2009  . Td 01/11/2009  . Zoster 09/03/2006    History  Substance Use Topics  . Smoking status: Former Smoker -- 1.50 packs/day for 45 years    Types: Cigarettes  . Smokeless tobacco: Never Used     Comment: 11/23/2012 "quit smoking 15-20 yr ago"  . Alcohol Use: No     Comment: 11/23/2012 "used to drink alot of whiskey; quit 15-20 yr ago"    Filed Vitals:   01/26/13 1333  BP: 137/56  Pulse: 80  Temp: 97.3 F (36.3 C)  Resp: 20    Physical Exam  GENERAL APPEARANCE: Alert, conversant. Appropriately groomed. No acute distress.  HEENT: Unremarkable. RESPIRATORY: Breathing is even, unlabored. Lung sounds are clear   CARDIOVASCULAR: Heart RRR no murmurs, rubs or gallops. No peripheral edema.  GASTROINTESTINAL: Abdomen is soft,  non-tender, not distended w/ normal bowel sounds.  NEUROLOGIC: Cranial nerves 2-12 grossly intact.  Patient Active Problem List   Diagnosis Date Noted  . S/P BKA (below knee amputation) unilateral   . Diabetic wet gangrene of the foot 11/22/2012  . Diabetic osteomyelitis 06/04/2012  . Hyponatremia 06/04/2012  .  Hypokalemia 06/04/2012  . Acute on chronic renal failure 06/04/2012  . Atrial fibrillation 08/31/2010  . Cardiac arrest 08/31/2010  . Cellulitis 08/31/2010  . Acute osteomyelitis, ankle and foot 02/06/2010  . UNSPECIFIED ANEMIA 01/11/2010  . CLOSTRIDIUM DIFFICILE COLITIS, HX OF 01/11/2010  . DECUBITUS ULCER, BUTTOCK 11/02/2009  . ANEMIA, PERNICIOUS 10/05/2009  . CONJUNCTIVITIS, BACTERIAL 08/08/2009  . SLEEP APNEA 02/06/2009  . BKA, RIGHT LEG 01/05/2009  . OSTEOARTHRITIS 06/18/2007  . HYPOTHYROIDISM NOS 09/03/2006  . Type II diabetes mellitus with renal manifestations, uncontrolled 06/11/2006  . B12 DEFICIENCY 06/11/2006  . HYPERLIPIDEMIA 06/11/2006  . GOUT 06/11/2006  . DEPRESSION 06/11/2006  . HYPERTENSION 06/11/2006  . RENAL INSUFFICIENCY 06/11/2006  . RESTLESS LEG SYNDROME, HX OF 06/11/2006    CBC    Component Value Date/Time   WBC 7.8 12/09/2012 1525   RBC 3.43* 12/09/2012 1525   RBC 2.70* 12/30/2009 0930   HGB 10.2* 12/09/2012 1525   HCT 30.8* 12/09/2012 1525   PLT 248 12/09/2012 1525   MCV 89.8 12/09/2012 1525   LYMPHSABS 0.7 12/09/2012 1525   MONOABS 0.5 12/09/2012 1525   EOSABS 0.1 12/09/2012 1525   BASOSABS 0.0 12/09/2012 1525    CMP     Component Value Date/Time   NA 130* 12/09/2012 1525   K 3.6 12/09/2012 1525   CL 96 12/09/2012 1525   CO2 25 12/09/2012 1525   GLUCOSE 112* 12/09/2012 1525   GLUCOSE 135* 12/17/2005 1011   BUN 28* 12/09/2012 1525   CREATININE 1.51* 12/09/2012 1525   CALCIUM 8.7 12/09/2012 1525   PROT 6.6 11/23/2012 0210   ALBUMIN 2.4* 11/23/2012 0210   AST 52* 11/23/2012 0210   ALT 17 11/23/2012 0210   ALKPHOS 32* 11/23/2012 0210   BILITOT  0.7 11/23/2012 0210   GFRNONAA 44* 12/09/2012 1525   GFRAA 51* 12/09/2012 1525    Assessment and Plan  Pt is being d/c to home in stable and improved condition with HHH, OT and PT.  Margit Hanks, MD

## 2013-01-29 ENCOUNTER — Other Ambulatory Visit: Payer: Self-pay | Admitting: Geriatric Medicine

## 2013-02-01 ENCOUNTER — Telehealth: Payer: Self-pay | Admitting: *Deleted

## 2013-02-01 NOTE — Telephone Encounter (Signed)
Home Health was given a verbal for continuous home health services ok'd per Dr. Laury Axon

## 2013-02-02 ENCOUNTER — Encounter: Payer: Self-pay | Admitting: Family Medicine

## 2013-02-02 ENCOUNTER — Ambulatory Visit (INDEPENDENT_AMBULATORY_CARE_PROVIDER_SITE_OTHER): Payer: Medicare Other | Admitting: Family Medicine

## 2013-02-02 VITALS — BP 130/82 | HR 71 | Temp 97.7°F

## 2013-02-02 DIAGNOSIS — E1129 Type 2 diabetes mellitus with other diabetic kidney complication: Secondary | ICD-10-CM

## 2013-02-02 DIAGNOSIS — G547 Phantom limb syndrome without pain: Secondary | ICD-10-CM

## 2013-02-02 DIAGNOSIS — N058 Unspecified nephritic syndrome with other morphologic changes: Secondary | ICD-10-CM

## 2013-02-02 DIAGNOSIS — E785 Hyperlipidemia, unspecified: Secondary | ICD-10-CM

## 2013-02-02 DIAGNOSIS — I10 Essential (primary) hypertension: Secondary | ICD-10-CM

## 2013-02-02 DIAGNOSIS — G546 Phantom limb syndrome with pain: Secondary | ICD-10-CM

## 2013-02-02 DIAGNOSIS — I4891 Unspecified atrial fibrillation: Secondary | ICD-10-CM

## 2013-02-02 LAB — CBC WITH DIFFERENTIAL/PLATELET
Basophils Absolute: 0 10*3/uL (ref 0.0–0.1)
Basophils Relative: 0.4 % (ref 0.0–3.0)
Eosinophils Relative: 2.2 % (ref 0.0–5.0)
HCT: 33.8 % — ABNORMAL LOW (ref 39.0–52.0)
MCHC: 34 g/dL (ref 30.0–36.0)
MCV: 92.9 fl (ref 78.0–100.0)
Monocytes Absolute: 0.4 10*3/uL (ref 0.1–1.0)
Monocytes Relative: 5.3 % (ref 3.0–12.0)
Neutrophils Relative %: 74.6 % (ref 43.0–77.0)
Platelets: 278 10*3/uL (ref 150.0–400.0)
RBC: 3.64 Mil/uL — ABNORMAL LOW (ref 4.22–5.81)
RDW: 15.3 % — ABNORMAL HIGH (ref 11.5–14.6)
WBC: 7.3 10*3/uL (ref 4.5–10.5)

## 2013-02-02 LAB — BASIC METABOLIC PANEL
BUN: 24 mg/dL — ABNORMAL HIGH (ref 6–23)
CO2: 28 mEq/L (ref 19–32)
Calcium: 9.4 mg/dL (ref 8.4–10.5)
Chloride: 102 mEq/L (ref 96–112)
GFR: 50.59 mL/min — ABNORMAL LOW (ref >60.00)
Potassium: 3.5 mEq/L (ref 3.5–5.1)

## 2013-02-02 LAB — HEPATIC FUNCTION PANEL
ALT: 14 U/L (ref 0–53)
AST: 16 U/L (ref 0–37)
Alkaline Phosphatase: 43 U/L (ref 39–117)
Total Bilirubin: 0.6 mg/dL (ref 0.3–1.2)

## 2013-02-02 LAB — LIPID PANEL
Cholesterol: 142 mg/dL (ref 0–200)
LDL Cholesterol: 82 mg/dL (ref 0–99)
Total CHOL/HDL Ratio: 5
VLDL: 33.6 mg/dL (ref 0.0–40.0)

## 2013-02-02 LAB — HEMOGLOBIN A1C: Hgb A1c MFr Bld: 6.4 % (ref 4.6–6.5)

## 2013-02-02 MED ORDER — OXYCODONE-ACETAMINOPHEN 5-325 MG PO TABS: 1.0000 | ORAL_TABLET | Freq: Four times a day (QID) | ORAL | Status: AC | PRN

## 2013-02-02 NOTE — Assessment & Plan Note (Signed)
Stable con't meds 

## 2013-02-02 NOTE — Progress Notes (Signed)
   Subjective:    Patient ID: [name], male    DOB: [birth year], 72 y.o.   MRN: [id number]  HPI Pt here f/u hospital/ rehab.  He was d/c home 3 days ago from rehab.  Pt was admitted for gangrene foot and s/p BKA.  HPI HYPERTENSION  Blood pressure range-not checking  Chest pain- no      Dyspnea- no Lightheadedness- no   Edema-no Other side effects - no   Medication compliance: good Low salt diet- yes  DIABETES  Blood Sugar ranges-high per pt  Polyuria- no New Visual problems- no Hypoglycemic symptoms- no Other side effects-no Medication compliance - good Last eye exam- was seen recently per pt and was supposed to have f/u but ended up in hospital.  He will call oph. Foot exam- NA--BL amputation  HYPERLIPIDEMIA  Medication compliance- good RUQ pain- no  Muscle aches- no Other side effects-no  ROS See HPI above   PMH Smoking Status noted      Review of Systems As above    Objective:   Physical Exam BP 130/82  Pulse 71  Temp(Src) 97.7 F (36.5 C) (Oral)  SpO2 96% General appearance: alert, cooperative, appears stated age and no distress Lungs: clear to auscultation bilaterally Heart: irregularly irregular rhythm Extremities: b/l BKA        Assessment & Plan:

## 2013-02-02 NOTE — Assessment & Plan Note (Signed)
On coumadin  Check pt

## 2013-02-02 NOTE — Patient Instructions (Addendum)
F/u 3 months for f/u and lab s     Diabetes and Sick Day Management Blood sugar (glucose) can be more difficult to control when you are sick. Colds, fever, flu, nausea, vomiting, and diarrhea are all examples of common illnesses that can cause problems for people with diabetes. Loss of body fluids (dehydration) from fever, vomiting, diarrhea, infection, and the stress of a sickness can all cause blood glucose levels to increase. Because of this, it is very important to take your diabetes medicines and to eat some form of carbohydrate food when you are sick. Liquid or soft foods are often tolerated, and they help to replace fluids. HOME CARE INSTRUCTIONS These main guidelines are intended for managing a short-term (24 hours or less) sickness:  Take your usual dose of insulin or oral diabetes medicine. An exception would be if you take any form of metformin. If you cannot eat or drink, you can become dehydrated and should not take this medicine.  Continue to take your insulin even if you are unable to eat solid foods or are vomiting. Your insulin dose may stay the same, or it may need to be increased when you are sick.  You will need to test your blood glucose more often, generally every 2 4 hours. If you have type 1 diabetes, test your urine for ketones every 4 hours. If you have type 2 diabetes, test your urine for ketones as directed by your health care provider.  Eat some form of food that contains carbohydrates. The carbohydrates can be in solid or liquid form. You should eat 45 50 g of carbohydrates every 3 4 hours.  Replace fluids if you have a fever, vomit, or have diarrhea. Ask your health care provider for specific rehydration instructions.  Watch carefully for the signs of ketoacidosis if you have type 1 diabetes. Call your health care provider if any of the following symptoms are present, especially in children:  Moderate to large ketones in the urine along with a high blood glucose  level.  Severe nausea.  Vomiting.  Diarrhea.  Abdominal pain.  Rapid breathing.  Drink extra liquids that do not contain sugar, such as water or sugar-free liquids, or caffeine.  Be careful with over-the-counter medicines. Read the labels. They may contain sugar or types of sugars that can increase your blood glucose level. Food Choices for Illness All of the food choices below contain about 15 g of carbohydrates. Plan ahead and keep some of these foods around.    to  cup carbonated beverage containing sugar. Carbonated beverages will usually be better tolerated if they are opened and left at room temperature for a few minutes.   of a twin frozen ice pop.   cup regular gelatin.   cup juice.   cup ice cream or frozen yogurt.   cup cooked cereal.   cup sherbet.  1 cup clear broth or soup.  1 cup cream soup.   cup regular custard.   cup regular pudding.  1 cup sports drink.  1 cup plain yogurt.  1 slice toast.  6 squares saltine crackers.  5 vanilla wafers.   cup sports drink. SEEK MEDICAL CARE IF:   You are unable to drink fluids, even small amounts.  You have nausea and vomiting for more than 6 hours.  You have diarrhea for more than 6 hours.  Your blood glucose level is more than 240 mg/dL, even with additional insulin.  There is a change in mental status.  You  develop an additional serious sickness.  You have been sick for 2 days and are not getting better.  You have had a fever for 2 days. SEEK IMMEDIATE MEDICAL CARE IF:  You have difficulty breathing.  You have moderate to large ketone levels. MAKE SURE YOU:  Understand these instructions.  Will watch your condition.  Will get help right away if you are not doing well or get worse. Document Released: 02/21/2003 Document Revised: 10/21/2012 Document Reviewed: 07/28/2012 [facility] Patient Information 2014 [place], [place].

## 2013-02-02 NOTE — Progress Notes (Signed)
Pre visit review using our clinic review tool, if applicable. No additional management support is needed unless otherwise documented below in the visit note. 

## 2013-02-02 NOTE — Assessment & Plan Note (Signed)
B/l BKA Referral reentered for endo con't current meds Check labs

## 2013-02-02 NOTE — Assessment & Plan Note (Signed)
Check labs con't lipitor  

## 2013-02-03 ENCOUNTER — Telehealth: Payer: Self-pay | Admitting: *Deleted

## 2013-02-03 MED ORDER — NYSTATIN 100000 UNIT/GM EX POWD: Freq: Three times a day (TID) | CUTANEOUS | Status: AC | PRN

## 2013-02-03 NOTE — Telephone Encounter (Signed)
Double check with Selena Batten but it was on his med list from rehab facility-- and pt said in ov he was taking it because I questioned why he was on so much.  If someone is checking blood sugars regularly we can hold lantus for now.  Referral was put in for endo--- maybe [place] can make that happen quickly?

## 2013-02-03 NOTE — Telephone Encounter (Signed)
[facility] called and stated that they had a couple of questions about his medications.   1. Patient is currently on coumadin. Should he be taking aspirin? 2. Patient medication list shows that he is taking Lantus, humalog and victoza. Patient has no prescription on file for the lantus. 3. Patient has a yeast infection in his groin area and on his buttocks that he failed to mention to the provider on the last visit. Would like to know if something can be prescribed for this.   Please advise. SW

## 2013-02-03 NOTE — Telephone Encounter (Signed)
Per Dr.Lowne patient is not suppose to be taking aspirin while on coumadin. Prescription was sent to pharmacy for the yeast infection. As I discussed this with the home health nurse she states that the patient is no longer taking the lantus and would like to know if the lantus needs to be discontinued. Advised nurse that Dr. Laury Axon is currently out the office until Monday. Please advise. SW

## 2013-02-09 ENCOUNTER — Telehealth: Payer: Self-pay | Admitting: *Deleted

## 2013-02-09 NOTE — Telephone Encounter (Signed)
Patient's neighbor called and stated that she was the patient nurse and that she takes care of him and. She states that patient has lost his prescription for oxycodone. Neighbor stated that past got a prescription from provider and that he has lost that prescription. Neighbor then stated that she went to the pharmacy to try and get the prescription filled and the pharmacist stated that they could not filled th the medication because a prescription has already been filled for 300+ oxycodone at the pharmacy. After placing patient on hold. Kim then took over the phone call. SW

## 2013-02-09 NOTE — Telephone Encounter (Signed)
Patient called back and states that his medication was found.

## 2013-02-10 NOTE — Telephone Encounter (Signed)
Noted  

## 2013-02-10 NOTE — Telephone Encounter (Signed)
Late entry 02/09/13: spoke with Debarah Crape and she identified to me that she was the patient's neighbor, she said she went to pick up the patient's medication for his Oxycodone and it was not at the pharmacy. I asked to speak with the patient and the patient advised that he did not get his Percocet, he said it was taken to the pharmacy by his neighbor and it was never brought back to him, he was told that they lost the medication. I called to the [facility] on [address], while the patient was on hold and the pharmacy Tech advised 360 pills for Percocet was filled on 01/28/13 at [facility] on [address]. I discussed with the patient and he said he is not sure where the medication went but he did not get this script. I asked where was the med's that Dr.Lowne prescribed for him on 02/02/13 and he said he did know. I asked who picks up the med's he asked me to speak with the neighbor Debarah Crape, I asked who pick up his med's and she told me anyone who comes to his home, she said the patient gives his scripts to everyone. I asked who picked up this last script and she said she did not know but the patient does not have it.

## 2013-02-10 NOTE — Telephone Encounter (Signed)
I called [facility] on [address] today and was made aware by the pharmacist that the patient is the one who picked up the medication on 01/28/13 and he signed for it. To MD for review.      KP

## 2013-02-17 ENCOUNTER — Telehealth: Payer: Self-pay | Admitting: Family Medicine

## 2013-02-17 NOTE — Telephone Encounter (Signed)
[name] from [facility] called and had questions regarding his insulin. Please call [name] back as soon as possible.    [phone]

## 2013-02-17 NOTE — Telephone Encounter (Signed)
Spoke with Thayer Ohm and advised that pt is to take Lantus and Novolog insulins. Patient stated he "had not taken Victoza in a long time."

## 2013-02-27 ENCOUNTER — Other Ambulatory Visit: Payer: Self-pay | Admitting: Geriatric Medicine

## 2013-03-10 ENCOUNTER — Telehealth: Payer: Self-pay | Admitting: *Deleted

## 2013-03-10 NOTE — Telephone Encounter (Signed)
[name] from [facility] called to see if we received an order for Mr [name] faxed over on 02/10/2013. I asked [name] to fax it again just incase we didn't.    Contact [phone] ext [phone]

## 2013-03-12 NOTE — Telephone Encounter (Signed)
Orders have been faxed.      KP 

## 2013-03-18 ENCOUNTER — Other Ambulatory Visit: Payer: Self-pay | Admitting: Geriatric Medicine

## 2013-03-29 DIAGNOSIS — E1165 Type 2 diabetes mellitus with hyperglycemia: Secondary | ICD-10-CM

## 2013-03-29 DIAGNOSIS — D518 Other vitamin B12 deficiency anemias: Secondary | ICD-10-CM

## 2013-03-29 DIAGNOSIS — E1129 Type 2 diabetes mellitus with other diabetic kidney complication: Secondary | ICD-10-CM

## 2013-03-29 DIAGNOSIS — N183 Chronic kidney disease, stage 3 unspecified: Secondary | ICD-10-CM

## 2013-03-29 DIAGNOSIS — S88119A Complete traumatic amputation at level between knee and ankle, unspecified lower leg, initial encounter: Secondary | ICD-10-CM

## 2013-03-30 ENCOUNTER — Telehealth: Payer: Self-pay | Admitting: *Deleted
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
## 2013-03-30 NOTE — Telephone Encounter (Signed)
Orders were signed and scanned in the system. [facility] to [facility] today.     KP

## 2013-03-30 NOTE — Telephone Encounter (Signed)
Carlena SaxBlair from [facility] called to check the status regarding paperwork for a power wheel chair and parts & repair. Carlena SaxBlair stated that she faxed paper work in October and has not received anything from us.    Contact Info Carlena SaxBlair [phone]

## 2013-04-07 ENCOUNTER — Other Ambulatory Visit: Payer: Self-pay | Admitting: Geriatric Medicine

## 2013-04-16 ENCOUNTER — Telehealth: Payer: Self-pay | Admitting: *Deleted

## 2013-04-16 NOTE — Telephone Encounter (Signed)
Patient's daughter, Nelida Goresatricia Harris, dropped off forms for [employer] to be completed, signed and faxed to [phone]. Form copied, billing sheet attached and placed in folder for Vidal SchwalbeSandra Johnson, RN. JG//CMA

## 2013-04-19 DIAGNOSIS — Z0279 Encounter for issue of other medical certificate: Secondary | ICD-10-CM

## 2013-04-19 NOTE — Telephone Encounter (Signed)
Paperwork competed and faxed to [employer], pts dtr.

## 2013-04-29 ENCOUNTER — Other Ambulatory Visit: Payer: Self-pay | Admitting: Family Medicine

## 2013-05-12 ENCOUNTER — Telehealth: Payer: Self-pay | Admitting: *Deleted

## 2013-05-25 ENCOUNTER — Telehealth: Payer: Self-pay | Admitting: Family Medicine

## 2013-05-25 NOTE — Telephone Encounter (Signed)
Card sent 

## 2013-06-02 ENCOUNTER — Other Ambulatory Visit: Payer: Self-pay | Admitting: Family Medicine

## 2013-06-02 NOTE — Telephone Encounter (Signed)
Received call from Mr. Willeen CassBennett with [facility] to inform us that the patient has expired and wanted confirmation that Dr. Laury AxonLowne would sign the death certificate. I advised she is listed as the PCP for this patient and should be able to sign.

## 2013-06-02 DEATH — deceased

## 2014-02-07 NOTE — Progress Notes (Signed)
Pt is deceased. 

## 2014-04-02 IMAGING — CR DG CHEST 2V
4 series · 4 of 4 positions shown · non-contrast
Comparison: None.

CLINICAL DATA: Shortness of breath and weakness

EXAM:
CHEST  2 VIEW

[w chest lat (1 of 2)]
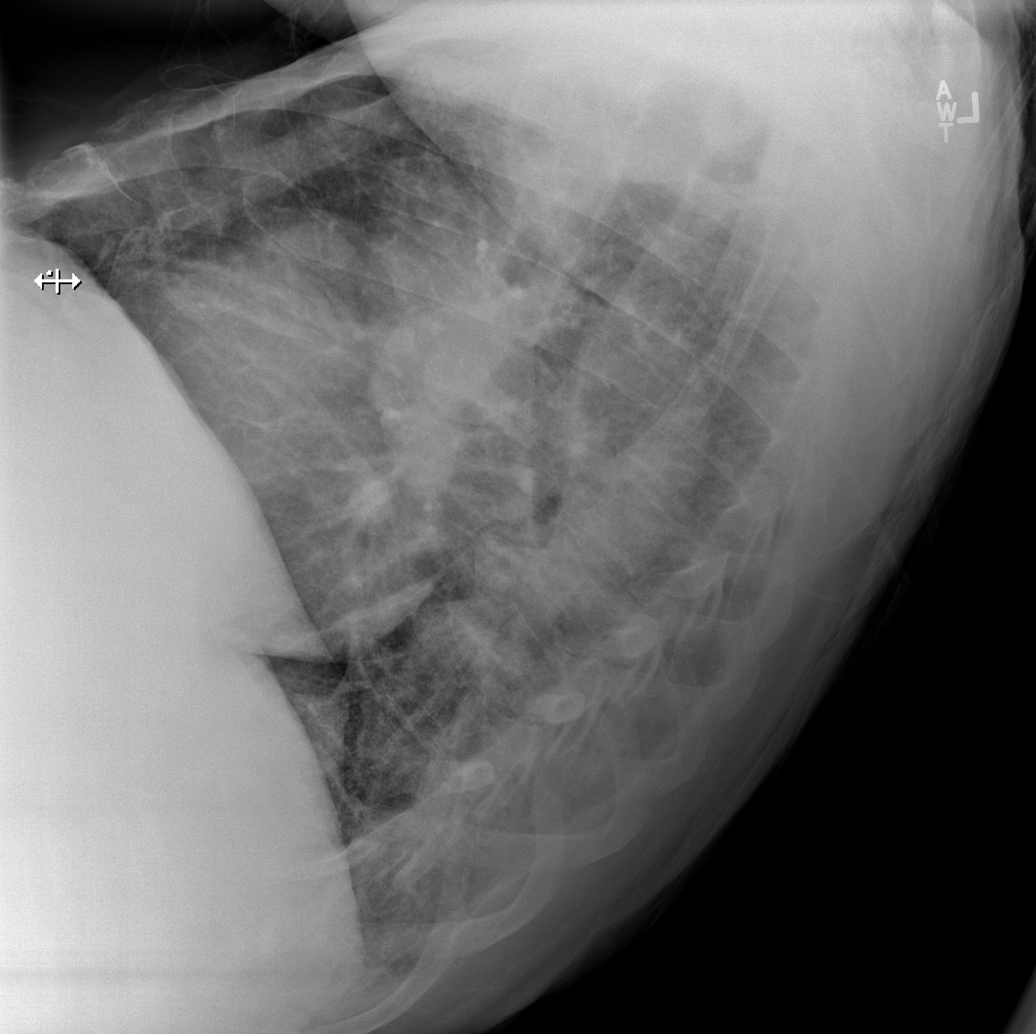

[w chest lat (2 of 2)]
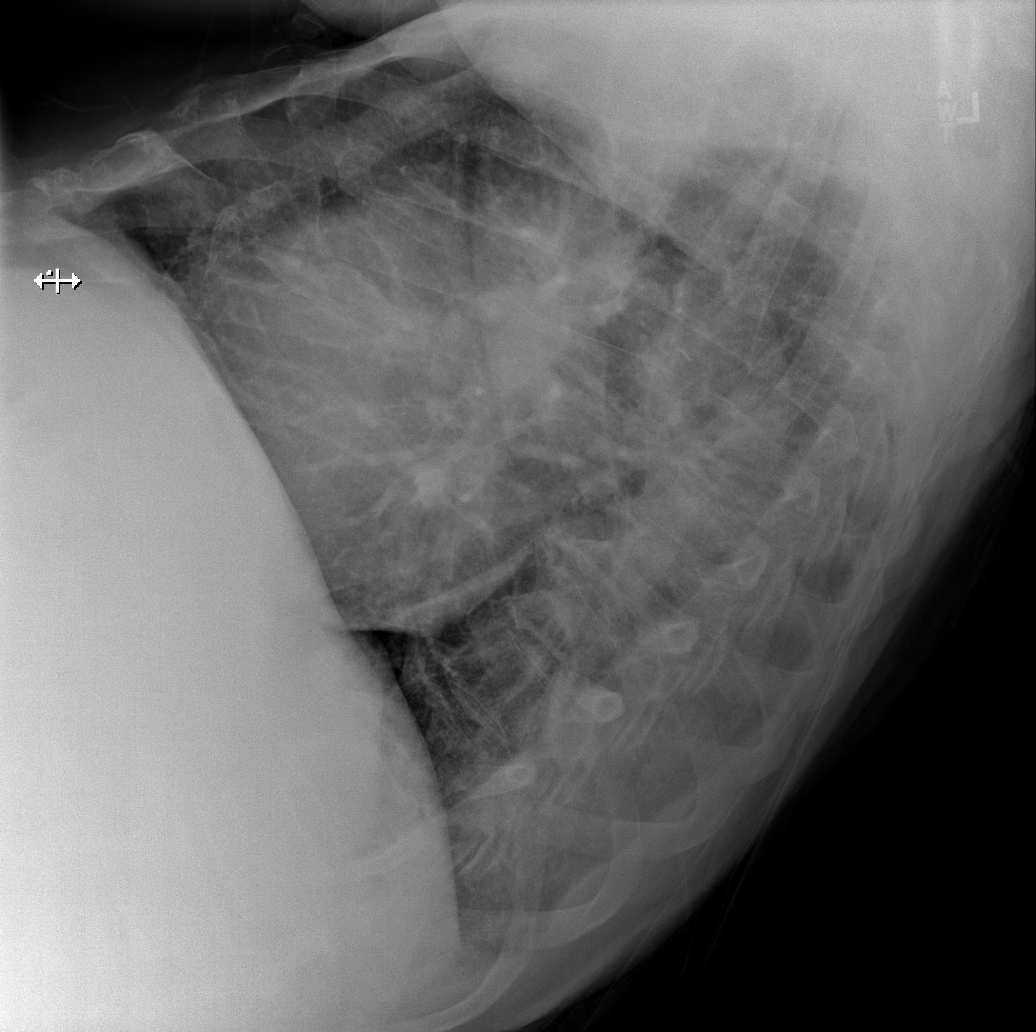

[x chest ap (1 of 2)]
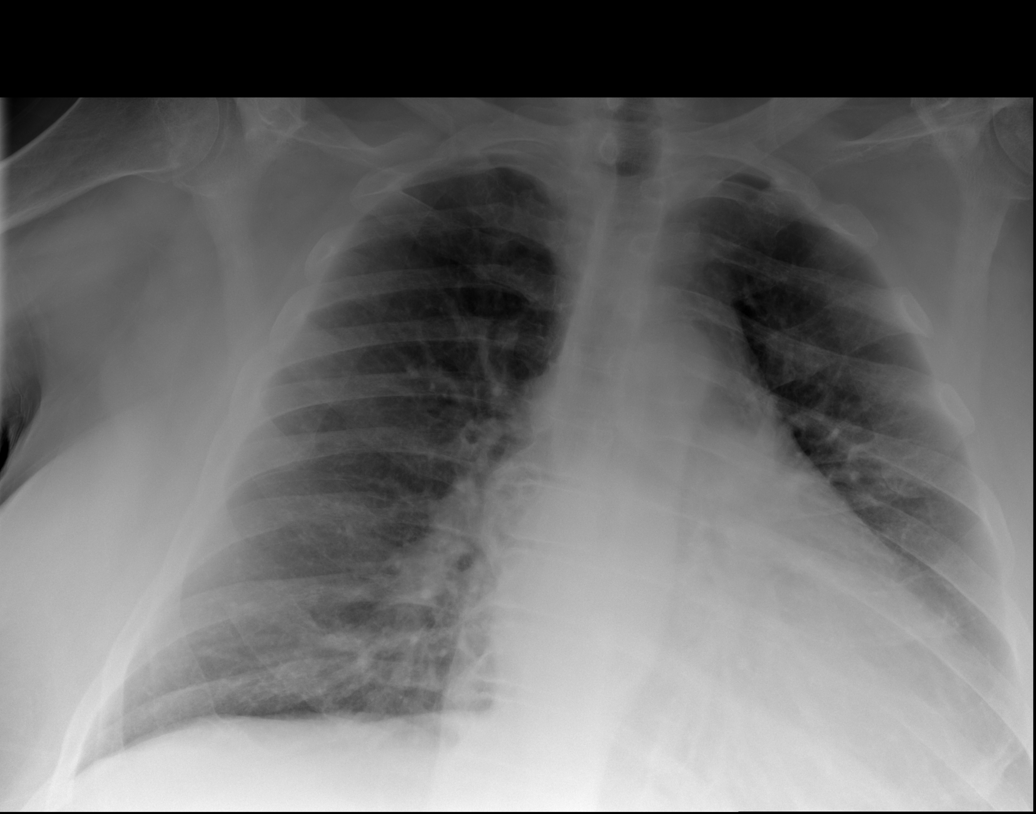

[x chest ap (2 of 2)]
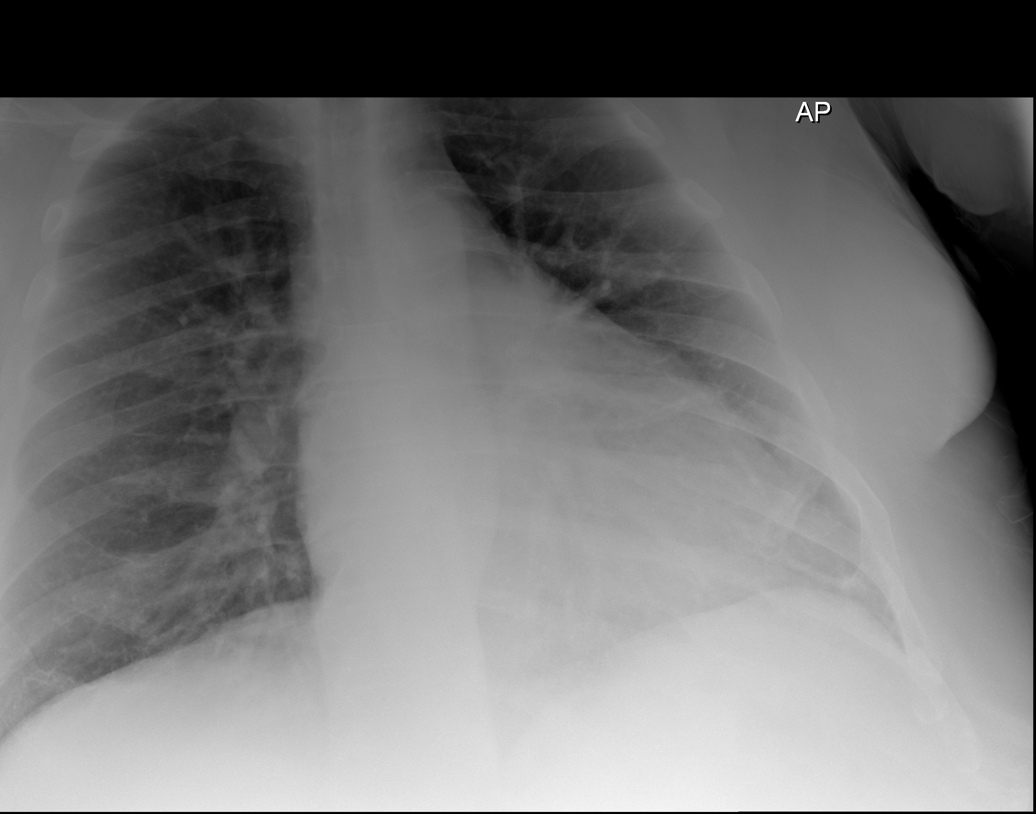

[4 of 4 positions shown; findings below may reference images not displayed]

FINDINGS: Heart size appears enlarged. There is no pleural effusion
identified. No airspace consolidation identified. Review of the
visualized osseous structures is on unremarkable.
IMPRESSION: 1.  No acute cardiopulmonary abnormalities.

2. Cardiac enlargement.
